# Patient Record
Sex: Female | Born: 1940 | Race: White | Hispanic: No | Marital: Single | Smoking: Former smoker
Health system: Southern US, Community
[De-identification: ages and names within clinical notes are randomized; demographics above are authoritative.]

## PROBLEM LIST (undated history)

## (undated) DIAGNOSIS — K5792 Diverticulitis of intestine, part unspecified, without perforation or abscess without bleeding: Secondary | ICD-10-CM

## (undated) DIAGNOSIS — F419 Anxiety disorder, unspecified: Secondary | ICD-10-CM

## (undated) DIAGNOSIS — E559 Vitamin D deficiency, unspecified: Secondary | ICD-10-CM

## (undated) DIAGNOSIS — K319 Disease of stomach and duodenum, unspecified: Secondary | ICD-10-CM

## (undated) DIAGNOSIS — I712 Thoracic aortic aneurysm, without rupture: Secondary | ICD-10-CM

## (undated) DIAGNOSIS — F329 Major depressive disorder, single episode, unspecified: Secondary | ICD-10-CM

## (undated) DIAGNOSIS — E876 Hypokalemia: Secondary | ICD-10-CM

## (undated) DIAGNOSIS — M810 Age-related osteoporosis without current pathological fracture: Secondary | ICD-10-CM

## (undated) DIAGNOSIS — F32A Depression, unspecified: Secondary | ICD-10-CM

## (undated) DIAGNOSIS — I10 Essential (primary) hypertension: Secondary | ICD-10-CM

## (undated) DIAGNOSIS — D62 Acute posthemorrhagic anemia: Secondary | ICD-10-CM

## (undated) DIAGNOSIS — K589 Irritable bowel syndrome without diarrhea: Secondary | ICD-10-CM

## (undated) DIAGNOSIS — G4700 Insomnia, unspecified: Secondary | ICD-10-CM

## (undated) DIAGNOSIS — E039 Hypothyroidism, unspecified: Secondary | ICD-10-CM

## (undated) DIAGNOSIS — E785 Hyperlipidemia, unspecified: Secondary | ICD-10-CM

## (undated) DIAGNOSIS — Q969 Turner's syndrome, unspecified: Secondary | ICD-10-CM

## (undated) DIAGNOSIS — H409 Unspecified glaucoma: Secondary | ICD-10-CM

## (undated) DIAGNOSIS — R011 Cardiac murmur, unspecified: Secondary | ICD-10-CM

## (undated) DIAGNOSIS — Z8719 Personal history of other diseases of the digestive system: Secondary | ICD-10-CM

## (undated) DIAGNOSIS — H539 Unspecified visual disturbance: Secondary | ICD-10-CM

## (undated) DIAGNOSIS — D72829 Elevated white blood cell count, unspecified: Secondary | ICD-10-CM

## (undated) DIAGNOSIS — K219 Gastro-esophageal reflux disease without esophagitis: Secondary | ICD-10-CM

## (undated) DIAGNOSIS — I7121 Aneurysm of the ascending aorta, without rupture: Secondary | ICD-10-CM

## (undated) DIAGNOSIS — R5381 Other malaise: Secondary | ICD-10-CM

## (undated) HISTORY — DX: Essential (primary) hypertension: I10

## (undated) HISTORY — DX: Hyperlipidemia, unspecified: E78.5

## (undated) HISTORY — DX: Turner's syndrome, unspecified: Q96.9

## (undated) HISTORY — DX: Unspecified visual disturbance: H53.9

## (undated) HISTORY — DX: Irritable bowel syndrome, unspecified: K58.9

## (undated) HISTORY — DX: Elevated white blood cell count, unspecified: D72.829

## (undated) HISTORY — DX: Major depressive disorder, single episode, unspecified: F32.9

## (undated) HISTORY — DX: Age-related osteoporosis without current pathological fracture: M81.0

## (undated) HISTORY — DX: Unspecified glaucoma: H40.9

## (undated) HISTORY — DX: Vitamin D deficiency, unspecified: E55.9

## (undated) HISTORY — DX: Gastro-esophageal reflux disease without esophagitis: K21.9

## (undated) HISTORY — PX: EYE SURGERY: SHX253

## (undated) HISTORY — DX: Acute posthemorrhagic anemia: D62

## (undated) HISTORY — DX: Other malaise: R53.81

## (undated) HISTORY — DX: Disease of stomach and duodenum, unspecified: K31.9

## (undated) HISTORY — DX: Depression, unspecified: F32.A

## (undated) HISTORY — PX: TONSILLECTOMY: SUR1361

## (undated) HISTORY — PX: GLAUCOMA VALVE INSERTION: SHX5297

## (undated) HISTORY — DX: Hypokalemia: E87.6

## (undated) HISTORY — DX: Insomnia, unspecified: G47.00

## (undated) HISTORY — PX: CATARACT EXTRACTION W/ INTRAOCULAR LENS  IMPLANT, BILATERAL: SHX1307

## (undated) HISTORY — DX: Thoracic aortic aneurysm, without rupture: I71.2

## (undated) HISTORY — DX: Aneurysm of the ascending aorta, without rupture: I71.21

## (undated) HISTORY — PX: APPENDECTOMY: SHX54

---

## 1993-01-09 HISTORY — PX: ABDOMINAL SURGERY: SHX537

## 1997-07-20 ENCOUNTER — Other Ambulatory Visit: Admission: RE | Admit: 1997-07-20 | Discharge: 1997-07-20 | Payer: Self-pay | Admitting: *Deleted

## 1997-08-01 ENCOUNTER — Other Ambulatory Visit: Admission: RE | Admit: 1997-08-01 | Discharge: 1997-08-01 | Payer: Self-pay | Admitting: Family Medicine

## 1997-08-02 ENCOUNTER — Other Ambulatory Visit: Admission: RE | Admit: 1997-08-02 | Discharge: 1997-08-02 | Payer: Self-pay | Admitting: Family Medicine

## 1997-08-05 ENCOUNTER — Other Ambulatory Visit: Admission: RE | Admit: 1997-08-05 | Discharge: 1997-08-05 | Payer: Self-pay | Admitting: Family Medicine

## 1997-09-02 ENCOUNTER — Other Ambulatory Visit: Admission: RE | Admit: 1997-09-02 | Discharge: 1997-09-02 | Payer: Self-pay | Admitting: *Deleted

## 1998-08-06 ENCOUNTER — Other Ambulatory Visit: Admission: RE | Admit: 1998-08-06 | Discharge: 1998-08-06 | Payer: Self-pay | Admitting: *Deleted

## 1998-08-06 ENCOUNTER — Encounter (INDEPENDENT_AMBULATORY_CARE_PROVIDER_SITE_OTHER): Payer: Self-pay

## 1999-08-23 ENCOUNTER — Ambulatory Visit (HOSPITAL_COMMUNITY): Admission: RE | Admit: 1999-08-23 | Discharge: 1999-08-23 | Payer: Self-pay | Admitting: Gastroenterology

## 2000-08-28 ENCOUNTER — Ambulatory Visit (HOSPITAL_COMMUNITY): Admission: RE | Admit: 2000-08-28 | Discharge: 2000-08-28 | Payer: Self-pay | Admitting: Gastroenterology

## 2005-03-27 ENCOUNTER — Other Ambulatory Visit: Admission: RE | Admit: 2005-03-27 | Discharge: 2005-03-27 | Payer: Self-pay | Admitting: Family Medicine

## 2006-08-12 ENCOUNTER — Emergency Department (HOSPITAL_COMMUNITY): Admission: EM | Admit: 2006-08-12 | Discharge: 2006-08-12 | Payer: Self-pay | Admitting: Emergency Medicine

## 2008-01-10 HISTORY — PX: OTHER SURGICAL HISTORY: SHX169

## 2008-04-07 ENCOUNTER — Encounter: Admission: RE | Admit: 2008-04-07 | Discharge: 2008-04-07 | Payer: Self-pay | Admitting: Otolaryngology

## 2009-06-21 ENCOUNTER — Other Ambulatory Visit: Admission: RE | Admit: 2009-06-21 | Discharge: 2009-06-21 | Payer: Self-pay | Admitting: Family Medicine

## 2009-10-11 ENCOUNTER — Emergency Department (HOSPITAL_COMMUNITY): Admission: EM | Admit: 2009-10-11 | Discharge: 2009-10-11 | Payer: Self-pay | Admitting: Emergency Medicine

## 2010-03-24 LAB — BASIC METABOLIC PANEL
BUN: 17 mg/dL (ref 6–23)
CO2: 25 mEq/L (ref 19–32)
Calcium: 9.2 mg/dL (ref 8.4–10.5)
Chloride: 94 mEq/L — ABNORMAL LOW (ref 96–112)
Creatinine, Ser: 0.65 mg/dL (ref 0.4–1.2)
GFR calc Af Amer: >60 mL/min (ref >60)
GFR calc non Af Amer: >60 mL/min (ref >60)
Glucose, Bld: 144 mg/dL — ABNORMAL HIGH (ref 70–99)
Potassium: 3.3 mEq/L — ABNORMAL LOW (ref 3.5–5.1)
Sodium: 131 mEq/L — ABNORMAL LOW (ref 135–145)

## 2010-03-24 LAB — URINALYSIS, ROUTINE W REFLEX MICROSCOPIC
Bilirubin Urine: NEGATIVE
Glucose, UA: NEGATIVE mg/dL
Hgb urine dipstick: NEGATIVE
Ketones, ur: 15 mg/dL — AB
Nitrite: NEGATIVE
Protein, ur: NEGATIVE mg/dL
Specific Gravity, Urine: 1.023 (ref 1.005–1.030)
Urobilinogen, UA: 0.2 mg/dL (ref 0.0–1.0)
pH: 6 (ref 5.0–8.0)

## 2010-03-24 LAB — DIFFERENTIAL
Basophils Absolute: 0 10*3/uL (ref 0.0–0.1)
Basophils Relative: 0 % (ref 0–1)
Eosinophils Absolute: 0 10*3/uL (ref 0.0–0.7)
Eosinophils Relative: 0 % (ref 0–5)
Lymphocytes Relative: 4 % — ABNORMAL LOW (ref 12–46)
Lymphs Abs: 0.7 10*3/uL (ref 0.7–4.0)
Monocytes Absolute: 0.5 10*3/uL (ref 0.1–1.0)
Monocytes Relative: 3 % (ref 3–12)
Neutro Abs: 14.4 10*3/uL — ABNORMAL HIGH (ref 1.7–7.7)
Neutrophils Relative %: 93 % — ABNORMAL HIGH (ref 43–77)

## 2010-03-24 LAB — HEPATIC FUNCTION PANEL
ALT: 18 U/L (ref 0–35)
AST: 21 U/L (ref 0–37)
Albumin: 4.4 g/dL (ref 3.5–5.2)
Alkaline Phosphatase: 79 U/L (ref 39–117)
Bilirubin, Direct: 0.1 mg/dL (ref 0.0–0.3)
Indirect Bilirubin: 0.6 mg/dL (ref 0.3–0.9)
Total Bilirubin: 0.7 mg/dL (ref 0.3–1.2)
Total Protein: 7.6 g/dL (ref 6.0–8.3)

## 2010-03-24 LAB — CBC
HCT: 48.7 % — ABNORMAL HIGH (ref 36.0–46.0)
Hemoglobin: 17 g/dL — ABNORMAL HIGH (ref 12.0–15.0)
MCH: 31.7 pg (ref 26.0–34.0)
MCHC: 35 g/dL (ref 30.0–36.0)
MCV: 90.6 fL (ref 78.0–100.0)
Platelets: 330 10*3/uL (ref 150–400)
RBC: 5.37 MIL/uL — ABNORMAL HIGH (ref 3.87–5.11)
RDW: 12.1 % (ref 11.5–15.5)
WBC: 15.5 10*3/uL — ABNORMAL HIGH (ref 4.0–10.5)

## 2010-05-27 NOTE — Procedures (Signed)
[facility]  Patient:    Kristi Erickson, Kristi Erickson                  MRN: [id number] Proc. Date: 08/23/99 Adm. Date:  [id number] Attending:  Orland Mustard CC:         Dellis Anes. Idell Pickles, M.D.   Procedure Report  PROCEDURE PERFORMED:  Colonoscopy.  ENDOSCOPIST:  Llana Aliment. Edwards, M.D.  MEDICATIONS:  Fentanyl 87.5 mcg and Versed 10 mg IV.  SCOPE:  Pediatric video colonoscope.  INDICATIONS:  Patient with abdominal pain.  She has been treated for diverticulitis.  CT scan done showing a markedly thickened sigmoid colon, could not rule out a tumor in this area.  Due to the abnormal CT, this procedure is done to rule out an occult tumor in this area.  DESCRIPTION OF PROCEDURE:  The procedure had been explained to the patient and consent obtained.  With the patient in the left lateral decubitus position, the pediatric video colonoscope was inserted and advanced under direct visualization.  The prep was quite good.  The patient had extensive diverticular disease in the left colon and it took sometime to get through. Once we were able to pass through this, we were able to advance rapidly to the cecum.  The right lower quadrant and ileocecal valve were seen and were normal.  The scope was withdrawn.  The ascending colon, hepatic flexure, transverse colon, splenic flexure, descending and sigmoid colon were seen well.  No other abnormalities were seen other than the diverticulosis in the sigmoid colon.  No polyps etc.  The scope was withdrawn.  The patient tolerated the procedure.  ASSESSMENT: 1. Abnormal computed tomography probably due to extensive    diverticular    disease.  No other abnormality seen. 2. 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  PLAN: 1. We will treat aggressively for diverticulosis with fiber    supplements and give a sheet about diverticular inflammation. 2. We will see her back in the office in 2-3 months. DD:  08/23/99 TD:   08/23/99 Job: [id number]

## 2010-05-27 NOTE — Procedures (Signed)
[facility]  Patient:    Kristi Erickson, Kristi Erickson Visit Number: [id number] MRN: [id number]          Service Type: END Location: ENDO Attending Physician:  Orland Mustard Proc. Date: 08/28/00 Adm. Date:  08/28/2000   CC:         Dellis Anes. Idell Pickles, M.D.   Procedure Report  PROCEDURE:  Esophagogastroduodenoscopy.  MEDICATIONS:  Hurricane spray, fentanyl 50 mcg, Versed 5 mg IV.  INDICATIONS FOR PROCEDURE:  Persistent epigastric pain of unclear etiology.  DESCRIPTION OF PROCEDURE:  The procedure had been explained to the patient and consent obtained. With the patient in the left lateral decubitus position, the Olympus video endoscope was inserted blindly into the esophagus and advanced under direct visualization. The stomach was entered, pylorus identified and passed. The duodenum including the bulb and second portion were seen well and were unremarkable. The scope was withdrawn back in the stomach. The pyloric channel was normal. The antrum and body were seen well and were normal. The fundus and cardia were seen on retroflexed view and were normal. There was a hiatal hernia with a small ulceration right at the gastroesophageal junction that appeared to be healing. It was approximately a 1/2 cm in diameter. The distal esophagus was otherwise unremarkable. The scope was withdrawn. The patient tolerated the procedure well.  ASSESSMENT:  Esophageal ulcer probably from reflux.  PLAN:  Will go ahead and give her a trial of Nexium since Aciphex is not helping her as well as she would like and see her back in the office in 6-8 weeks. Attending Physician:  Orland Mustard DD:  08/28/00 TD:  08/28/00 Job: [id number]

## 2010-10-24 LAB — COMPREHENSIVE METABOLIC PANEL
ALT: 27
AST: 25
Albumin: 4.1
Alkaline Phosphatase: 78
BUN: 17
CO2: 27
Calcium: 9.1
Chloride: 101
Creatinine, Ser: 0.67
GFR calc Af Amer: >60
GFR calc non Af Amer: >60
Glucose, Bld: 129 — ABNORMAL HIGH
Potassium: 3.7
Sodium: 134 — ABNORMAL LOW
Total Bilirubin: 0.7
Total Protein: 7.2

## 2010-10-24 LAB — CBC
HCT: 46.2 — ABNORMAL HIGH
Hemoglobin: 16.3 — ABNORMAL HIGH
MCHC: 35.2
MCV: 87.8
Platelets: 315
RBC: 5.26 — ABNORMAL HIGH
RDW: 12.1
WBC: 14.4 — ABNORMAL HIGH

## 2010-10-24 LAB — POCT CARDIAC MARKERS
CKMB, poc: 1
CKMB, poc: <1 — ABNORMAL LOW
CKMB, poc: <1 — ABNORMAL LOW
Myoglobin, poc: 26.2
Myoglobin, poc: 33.6
Myoglobin, poc: 40.5
Operator id: 1211
Operator id: 1211
Operator id: 1211
Troponin i, poc: <0.05
Troponin i, poc: <0.05
Troponin i, poc: <0.05

## 2010-10-24 LAB — DIFFERENTIAL
Basophils Absolute: 0
Basophils Relative: 0
Eosinophils Absolute: 0
Eosinophils Relative: 0
Lymphocytes Relative: 6 — ABNORMAL LOW
Lymphs Abs: 0.8
Monocytes Absolute: 0.9 — ABNORMAL HIGH
Monocytes Relative: 6
Neutro Abs: 12.6 — ABNORMAL HIGH
Neutrophils Relative %: 88 — ABNORMAL HIGH

## 2010-10-24 LAB — D-DIMER, QUANTITATIVE: D-Dimer, Quant: <0.22

## 2011-08-10 HISTORY — PX: TRANSTHORACIC ECHOCARDIOGRAM: SHX275

## 2014-01-15 ENCOUNTER — Other Ambulatory Visit: Payer: Self-pay | Admitting: Obstetrics and Gynecology

## 2014-01-15 DIAGNOSIS — K5730 Diverticulosis of large intestine without perforation or abscess without bleeding: Secondary | ICD-10-CM

## 2014-01-16 ENCOUNTER — Ambulatory Visit
Admission: RE | Admit: 2014-01-16 | Discharge: 2014-01-16 | Disposition: A | Discharge status: Home / Self Care | Payer: Medicare Other | Source: Ambulatory Visit | Attending: Obstetrics and Gynecology | Admitting: Obstetrics and Gynecology

## 2014-01-16 DIAGNOSIS — K5730 Diverticulosis of large intestine without perforation or abscess without bleeding: Secondary | ICD-10-CM

## 2014-01-16 MED ORDER — IOHEXOL 300 MG/ML  SOLN
100.0000 mL | Freq: Once | INTRAMUSCULAR | Status: AC | PRN
  Administered 2014-01-16: 100 mL via INTRAVENOUS

## 2015-03-18 ENCOUNTER — Telehealth: Payer: Self-pay | Admitting: Internal Medicine

## 2015-03-18 NOTE — Telephone Encounter (Signed)
Received records from [facility] for appointment on 03/31/15 with Dr Hilty.  Records given to N Hines (medical records) for Dr Hilty's schedule on 03/31/15. lp °

## 2015-03-31 ENCOUNTER — Encounter: Payer: Self-pay | Admitting: Internal Medicine

## 2015-03-31 ENCOUNTER — Ambulatory Visit (INDEPENDENT_AMBULATORY_CARE_PROVIDER_SITE_OTHER): Payer: Medicare Other | Admitting: Internal Medicine

## 2015-03-31 VITALS — BP 124/76 | HR 62 | Ht 58.5 in | Wt 115.0 lb

## 2015-03-31 DIAGNOSIS — I351 Nonrheumatic aortic (valve) insufficiency: Secondary | ICD-10-CM | POA: Diagnosis not present

## 2015-03-31 DIAGNOSIS — I10 Essential (primary) hypertension: Secondary | ICD-10-CM | POA: Insufficient documentation

## 2015-03-31 DIAGNOSIS — E785 Hyperlipidemia, unspecified: Secondary | ICD-10-CM | POA: Diagnosis not present

## 2015-03-31 NOTE — Progress Notes (Signed)
Patient ID: Kristi Erickson, female   DOB: Oct 04, 1940, 75 y.o.   MRN: [id number]    OFFICE NOTE  Chief Complaint:  Aortic Regurgitation  Primary Care Physician: Gaye AlkenBARNES,ELIZABETH STEWART, MD  HPI:  Kristi Comptonleanor Sue Marshburn is a 75 y.o. female with a history of Aortic Regurgitation, HTN, HLD, Hypothyroidism, Turner's Syndrome, Horseshoe Kidney, GERD, Glaucoma. She presents today for follow up. She was seen about four years ago by Dr. Rennis GoldenHilty for aortic regurgitation. Echocardiogram 08/18/2011 revealed Normal LV function and parameters. Trileaflet Aortic Valve with Moderate Regurgitation and mild calcification. She did not have any anginal symptoms or shortness of breath at that time. She has continued to be treated for her other chronic medical problems in the interval since her visit then. She maintains good control of her blood pressure with Quinipril and Diltiazem, she is being treated for Hyperlipidemia with Simvastatin. She is also treated for hypothyroidism.   She has not had any development of new chest pain at rest or with exertion, she has not had any light headedness with exertion or presyncope, she does not have any shortness of breath at rest. She will get short of breath after walking 2 blocks with recovery in 1-2 minutes. She is able to climb 2 flights of stairs without difficulty. She has not had any lower extremity edema. She takes her medications daily and continues to follow with her PCP Dr. Zachery DauerBarnes. Dr. Zachery DauerBarnes referred her back here for follow up and echocardiography monitoring.   PMHx:  Past Medical History  Diagnosis Date  . Vision disorder   . Stomach problems   . Vitamin D deficiency   . Turner's syndrome   . GERD (gastroesophageal reflux disease)   . Glaucoma   . Hypertension   . Hyperlipidemia   . Osteoporosis     No past surgical history on file.  FAMHx:  Family History  Problem Relation Age of Onset  . Heart attack Maternal Grandfather     SOCHx:   reports  that she has quit smoking. She has never used smokeless tobacco. She reports that she drinks alcohol. She reports that she does not use illicit drugs.  ALLERGIES:  Allergies  Allergen Reactions  . Shellfish-Derived Products Nausea And Vomiting    Clam, oyster    ROS: Pertinent items are noted in HPI.  HOME MEDS: Current Outpatient Prescriptions  Medication Sig Dispense Refill  . acetaminophen (TYLENOL) 500 MG tablet Take 500 mg by mouth every morning.    Marland Kitchen. alendronate (FOSAMAX) 70 MG tablet Take 1 tablet by mouth once a week.    . Ascorbic Acid (VITAMIN C) 1000 MG tablet Take 4,000 mg by mouth daily.    . Cholecalciferol (VITAMIN D) 2000 units CAPS Take 1 capsule by mouth daily.    Marland Kitchen. dicyclomine (BENTYL) 20 MG tablet Take 20 mg by mouth 3 (three) times daily as needed.  0  . diltiazem (DILACOR XR) 180 MG 24 hr capsule Take 1 capsule by mouth daily.    . dorzolamide (TRUSOPT) 2 % ophthalmic solution Place 1 drop into both eyes 2 (two) times daily.    Marland Kitchen. levothyroxine (SYNTHROID, LEVOTHROID) 88 MCG tablet Take 1 tablet by mouth every morning.    . mirtazapine (REMERON) 45 MG tablet Take 1 tablet by mouth daily.    . Multiple Vitamin (MULTIVITAMIN) tablet Take 1 tablet by mouth daily.    . quinapril (ACCUPRIL) 40 MG tablet Take 1 tablet by mouth daily.    . simvastatin (ZOCOR) 40 MG tablet  Take 1 tablet by mouth daily.    . temazepam (RESTORIL) 15 MG capsule Take 1 capsule by mouth at bedtime.     No current facility-administered medications for this visit.    LABS/IMAGING: No results found for this or any previous visit (from the past 48 hour(s)). No results found.  WEIGHTS: Wt Readings from Last 3 Encounters:  03/31/15 115 lb (52.164 kg)    VITALS: BP 124/76 mmHg  Pulse 62  Ht 4' 10.5" (1.486 m)  Wt 115 lb (52.164 kg)  BMI 23.62 kg/m2  EXAM: General appearance: alert, cooperative and no distress Neck: no adenopathy, no carotid bruit, no JVD, supple, symmetrical,  trachea midline and thyroid not enlarged, symmetric, no tenderness/mass/nodules Lungs: clear to auscultation bilaterally Heart: regular rate and rhythm, S1, S2 normal and diastolic murmur: early diastolic 3/6, decrescendo at 2nd left intercostal space Abdomen: soft, non-tender; bowel sounds normal; no masses,  no organomegaly Extremities: extremities normal, atraumatic, no cyanosis or edema and no edema, redness or tenderness in the calves or thighs Pulses: 2+ and symmetric Skin: Skin color, texture, turgor normal. No rashes or lesions  EKG: 3/22: Normal Sinus Rhythm, Left anterior fascicular block, otherwise normal. Compared to previous.   ASSESSMENT: 1. Aortic Regurgitation - moderate and without symptoms. 2. HTN 3. HLD 4. Hypothyroidism   PLAN: 1.   Repeat Echocardiogram for monitoring.  2.   Continue current HTN regimen - on Diltiazem and Qunipril.  3.   HLD - continue zocor, monitoring per pcp 4    F/u in 1 year unless echo abnormal.   Justa Hatchell 03/31/2015, 11:50 AM  Pt. Seen and examined. Agree with the NP/PA-C note as written. [name] presents for follow-up of her aortic insufficiency. She seems to be asymptomatic with it. She is overdue for an echocardiogram. Her exam is unremarkable except for diastolic murmur. Blood pressure seems to be well-controlled. Her primary care provider is managing her dyslipidemia. We will call her with the results of her echo otherwise plan continued medical therapy.  Chrystie Nose, MD, [facility] Attending Cardiologist [facility]

## 2015-03-31 NOTE — Patient Instructions (Signed)
Your physician has requested that you have an echocardiogram @ [address] Echocardiography is a painless test that uses sound waves to create images of your heart. It provides your doctor with information about the size and shape of your heart and how well your heart's chambers and valves are working. This procedure takes approximately one hour. There are no restrictions for this procedure.   Your physician recommends that you schedule a follow-up appointment as needed - we will call you with the results.

## 2015-04-15 ENCOUNTER — Other Ambulatory Visit: Payer: Self-pay

## 2015-04-15 ENCOUNTER — Ambulatory Visit (HOSPITAL_COMMUNITY): Payer: Medicare Other | Attending: Cardiology

## 2015-04-15 DIAGNOSIS — I10 Essential (primary) hypertension: Secondary | ICD-10-CM | POA: Insufficient documentation

## 2015-04-15 DIAGNOSIS — E785 Hyperlipidemia, unspecified: Secondary | ICD-10-CM | POA: Insufficient documentation

## 2015-04-15 DIAGNOSIS — I351 Nonrheumatic aortic (valve) insufficiency: Secondary | ICD-10-CM | POA: Diagnosis not present

## 2015-04-15 DIAGNOSIS — I359 Nonrheumatic aortic valve disorder, unspecified: Secondary | ICD-10-CM | POA: Diagnosis present

## 2015-04-15 DIAGNOSIS — I77810 Thoracic aortic ectasia: Secondary | ICD-10-CM | POA: Insufficient documentation

## 2015-04-19 ENCOUNTER — Encounter: Payer: Self-pay | Admitting: *Deleted

## 2015-04-19 ENCOUNTER — Other Ambulatory Visit: Payer: Self-pay | Admitting: *Deleted

## 2015-04-19 DIAGNOSIS — I77810 Thoracic aortic ectasia: Secondary | ICD-10-CM

## 2015-04-19 DIAGNOSIS — Z79899 Other long term (current) drug therapy: Secondary | ICD-10-CM

## 2015-04-20 ENCOUNTER — Telehealth: Payer: Self-pay | Admitting: *Deleted

## 2015-04-20 ENCOUNTER — Other Ambulatory Visit: Payer: Self-pay | Admitting: *Deleted

## 2015-04-20 DIAGNOSIS — I77810 Thoracic aortic ectasia: Secondary | ICD-10-CM

## 2015-04-20 NOTE — Telephone Encounter (Signed)
Spoke with patient regarding appointment for CTA chest for dilated ascending aorta--scheduled for 04/29/15 @ 3:30 pm--arrive at 3:15 pm for check in.  Nothing to eat or drink after 12 noon on 04/29/15.  Please have lab work done this week and f/u appointment with Dr. Rennis GoldenHilty 05/10/15 @ 2:15 pm.  Patient voiced her understanding.

## 2015-04-22 LAB — CREATININE, SERUM: CREATININE: 0.64 mg/dL (ref 0.60–0.93)

## 2015-04-22 LAB — BUN: BUN: 21 mg/dL (ref 7–25)

## 2015-04-29 ENCOUNTER — Ambulatory Visit (HOSPITAL_COMMUNITY): Payer: Medicare Other

## 2015-04-29 ENCOUNTER — Ambulatory Visit (INDEPENDENT_AMBULATORY_CARE_PROVIDER_SITE_OTHER)
Admission: RE | Admit: 2015-04-29 | Discharge: 2015-04-29 | Disposition: A | Discharge status: Home / Self Care | Payer: Medicare Other | Source: Ambulatory Visit | Attending: Internal Medicine | Admitting: Internal Medicine

## 2015-04-29 DIAGNOSIS — I77810 Thoracic aortic ectasia: Secondary | ICD-10-CM | POA: Diagnosis not present

## 2015-04-29 MED ORDER — IOHEXOL 350 MG/ML SOLN
100.0000 mL | Freq: Once | INTRAVENOUS | Status: AC | PRN
  Administered 2015-04-29: 100 mL via INTRAVENOUS

## 2015-04-30 ENCOUNTER — Other Ambulatory Visit: Payer: Self-pay | Admitting: *Deleted

## 2015-04-30 DIAGNOSIS — I77810 Thoracic aortic ectasia: Secondary | ICD-10-CM

## 2015-05-10 ENCOUNTER — Encounter: Payer: Self-pay | Admitting: Internal Medicine

## 2015-05-10 ENCOUNTER — Ambulatory Visit (INDEPENDENT_AMBULATORY_CARE_PROVIDER_SITE_OTHER): Payer: Medicare Other | Admitting: Internal Medicine

## 2015-05-10 VITALS — BP 128/70 | HR 69 | Ht <= 58 in | Wt 114.0 lb

## 2015-05-10 DIAGNOSIS — I10 Essential (primary) hypertension: Secondary | ICD-10-CM | POA: Diagnosis not present

## 2015-05-10 DIAGNOSIS — I351 Nonrheumatic aortic (valve) insufficiency: Secondary | ICD-10-CM

## 2015-05-10 DIAGNOSIS — E785 Hyperlipidemia, unspecified: Secondary | ICD-10-CM

## 2015-05-10 NOTE — Patient Instructions (Signed)
Your physician wants you to follow-up in: 6 months with Dr. Rennis GoldenHilty. You will receive a reminder letter in the mail two months in advance. If you don't receive a letter, please call our office to schedule the follow-up appointment. -- you will receive a notification via MyChart when it is time for your next appointment.

## 2015-05-11 ENCOUNTER — Encounter: Payer: Medicare Other | Admitting: Thoracic Surgery (Cardiothoracic Vascular Surgery)

## 2015-05-12 ENCOUNTER — Encounter: Payer: Medicare Other | Admitting: Thoracic Surgery (Cardiothoracic Vascular Surgery)

## 2015-05-12 ENCOUNTER — Encounter: Payer: Self-pay | Admitting: Thoracic Surgery (Cardiothoracic Vascular Surgery)

## 2015-05-12 NOTE — Progress Notes (Signed)
This encounter was created in error - please disregard.  This encounter was created in error - please disregard.

## 2015-05-14 NOTE — Progress Notes (Signed)
Patient ID: Kristi Erickson, female   DOB: 28-Oct-1940, 75 y.o.   MRN: [id number]    OFFICE NOTE  Chief Complaint:  Aortic Regurgitation  Primary Care Physician: Gaye AlkenBARNES,ELIZABETH STEWART, MD  HPI:  Kristi Erickson is a 75 y.o. female with a history of Aortic Regurgitation, HTN, HLD, Hypothyroidism, Turner's Syndrome, Horseshoe Kidney, GERD, Glaucoma. She presents today for follow up. She was seen about four years ago by Dr. Rennis GoldenHilty for aortic regurgitation. Echocardiogram 08/18/2011 revealed Normal LV function and parameters. Trileaflet Aortic Valve with Moderate Regurgitation and mild calcification. She did not have any anginal symptoms or shortness of breath at that time. She has continued to be treated for her other chronic medical problems in the interval since her visit then. She maintains good control of her blood pressure with Quinipril and Diltiazem, she is being treated for Hyperlipidemia with Simvastatin. She is also treated for hypothyroidism.   She has not had any development of new chest pain at rest or with exertion, she has not had any light headedness with exertion or presyncope, she does not have any shortness of breath at rest. She will get short of breath after walking 2 blocks with recovery in 1-2 minutes. She is able to climb 2 flights of stairs without difficulty. She has not had any lower extremity edema. She takes her medications daily and continues to follow with her PCP Dr. Zachery DauerBarnes. Dr. Zachery DauerBarnes referred her back here for follow up and echocardiography monitoring.   05/10/2015  Mrs. Kristi Erickson returns today for follow-up. She was sent for an echocardiogram which demonstrated an LVEF of 60-65% with mild to moderate AI and a dilated aortic root measuring between 4.8 and 5.1 cm. Based on these findings I recommended a CT aortogram which she had on 04/29/2015. This demonstrated aneurysm of the ascending aorta, measuring up to 5.3 cm. I reviewed the results and had already referred  her to thoracic surgery prior to this appointment. We again reviewed those results in the office today.  PMHx:  Past Medical History  Diagnosis Date  . Vision disorder   . Stomach problems   . Vitamin D deficiency   . Turner's syndrome   . GERD (gastroesophageal reflux disease)   . Glaucoma   . Hypertension   . Hyperlipidemia   . Osteoporosis     Past Surgical History  Procedure Laterality Date  . Other surgical history  2010     ear surgery  . Abdominal surgery  1995  . Transthoracic echocardiogram  08/2011    EF=/> 55%, LA mildly dilated, midl mitral annular calcification, trace MR, trace TR, mild AV calcification w/mod regurg, trace pulm valve regurg    FAMHx:  Family History  Problem Relation Age of Onset  . Heart attack Maternal Grandfather   . Heart attack Father   . Skin cancer Brother     SOCHx:   reports that she has quit smoking. She has never used smokeless tobacco. She reports that she drinks alcohol. She reports that she does not use illicit drugs.  ALLERGIES:  Allergies  Allergen Reactions  . Shellfish-Derived Products Nausea And Vomiting    Clam, oyster    ROS: Pertinent items are noted in HPI.  HOME MEDS: Current Outpatient Prescriptions  Medication Sig Dispense Refill  . acetaminophen (TYLENOL) 500 MG tablet Take 500 mg by mouth every morning.    Marland Kitchen. alendronate (FOSAMAX) 70 MG tablet Take 1 tablet by mouth once a week.    . Ascorbic Acid (VITAMIN C)  1000 MG tablet Take 4,000 mg by mouth daily.    . Cholecalciferol (VITAMIN D) 2000 units CAPS Take 1 capsule by mouth daily.    Marland Kitchen dicyclomine (BENTYL) 20 MG tablet Take 20 mg by mouth 3 (three) times daily as needed.  0  . diltiazem (DILACOR XR) 180 MG 24 hr capsule Take 1 capsule by mouth daily.    . dorzolamide (TRUSOPT) 2 % ophthalmic solution Place 1 drop into both eyes 2 (two) times daily.    Marland Kitchen levothyroxine (SYNTHROID, LEVOTHROID) 88 MCG tablet Take 1 tablet by mouth every morning.    .  mirtazapine (REMERON) 45 MG tablet Take 1 tablet by mouth daily.    . Multiple Vitamin (MULTIVITAMIN) tablet Take 1 tablet by mouth daily.    . quinapril (ACCUPRIL) 40 MG tablet Take 1 tablet by mouth daily.    . simvastatin (ZOCOR) 40 MG tablet Take 1 tablet by mouth daily.    . temazepam (RESTORIL) 15 MG capsule Take 1 capsule by mouth at bedtime.     No current facility-administered medications for this visit.    LABS/IMAGING: No results found for this or any previous visit (from the past 48 hour(s)). No results found.  WEIGHTS: Wt Readings from Last 3 Encounters:  05/12/15 114 lb (51.71 kg)  05/10/15 114 lb (51.71 kg)  03/31/15 115 lb (52.164 kg)    VITALS: BP 128/70 mmHg  Pulse 69  Ht  (1.473 m)  Wt 114 lb (51.71 kg)  BMI 23.83 kg/m2  EXAM: Deferred  EKG: Deferred  ASSESSMENT: 1. Aortic Regurgitation - mild to moderate 2. 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 HTN 4. HLD 5. Hypothyroidism   PLAN: 1. There is a 5.3 cm ascending aortic aneurysm noted by CT angiography. She has mild to moderate aortic insufficiency associated with this. I will refer her to Dr. Dorris Fetch for evaluation and either close follow-up or possible surgical repair in the near future.  Chrystie Nose, MD, [facility] Attending Cardiologist [facility]  Lisette Abu Kobi Aller 05/14/2015, 1:28 PM

## 2015-05-19 ENCOUNTER — Institutional Professional Consult (permissible substitution) (INDEPENDENT_AMBULATORY_CARE_PROVIDER_SITE_OTHER): Payer: Medicare Other | Admitting: Thoracic Surgery (Cardiothoracic Vascular Surgery)

## 2015-05-19 ENCOUNTER — Encounter: Payer: Self-pay | Admitting: Thoracic Surgery (Cardiothoracic Vascular Surgery)

## 2015-05-19 ENCOUNTER — Encounter: Payer: Medicare Other | Admitting: Cardiothoracic Surgery

## 2015-05-19 VITALS — BP 123/75 | HR 72 | Resp 20 | Ht <= 58 in | Wt 114.0 lb

## 2015-05-19 DIAGNOSIS — E041 Nontoxic single thyroid nodule: Secondary | ICD-10-CM | POA: Diagnosis not present

## 2015-05-19 DIAGNOSIS — Q631 Lobulated, fused and horseshoe kidney: Secondary | ICD-10-CM

## 2015-05-19 DIAGNOSIS — I712 Thoracic aortic aneurysm, without rupture: Secondary | ICD-10-CM | POA: Insufficient documentation

## 2015-05-19 DIAGNOSIS — I7121 Aneurysm of the ascending aorta, without rupture: Secondary | ICD-10-CM | POA: Insufficient documentation

## 2015-05-19 DIAGNOSIS — I7122 Aneurysm of the aortic arch, without rupture: Secondary | ICD-10-CM | POA: Insufficient documentation

## 2015-05-19 DIAGNOSIS — Q969 Turner's syndrome, unspecified: Secondary | ICD-10-CM | POA: Insufficient documentation

## 2015-05-19 NOTE — Progress Notes (Signed)
PCP is Gaye Alken, MD Referring Provider is Rennis Golden Lisette Abu, MD  Chief Complaint  Patient presents with  . 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 She had an echocardiogram in 2013 which showed moderate aortic insufficiency and preserved left ventricular function. She recently had a repeat echocardiogram. It showed mild-to-moderate aortic insufficiency, normal left ventricular function, and mild-to-moderate aortic insufficiency. The ascending aorta was noted to be dilated. 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 There was marked dilatation of the origin of the innominate artery as well. There was no evidence of dissection.  She is 4 feet 10 inches tall and weighs 114 pounds. She complains of shortness of breath with exertion after walking about a block or a flight of stairs. She says that she could get up 2 flights of stairs if she had to. She does recover within a minute or so of resting. She denies any chest pain, pressure, or tightness associated with shortness of breath. She has not had any presyncopal or syncopal spells. She is not aware of any family history of aneurysms.   Past Medical History  Diagnosis Date  . Vision disorder   . Stomach problems   . Vitamin D deficiency   . Turner's syndrome   . GERD (gastroesophageal reflux disease)   . Glaucoma   . Hypertension   . Hyperlipidemia   . Osteoporosis     Past Surgical History  Procedure Laterality Date  . Other surgical history  2010     ear surgery  . Abdominal surgery   1995  . Transthoracic echocardiogram  08/2011    EF=/> 55%, LA mildly dilated, midl mitral annular calcification, trace MR, trace TR, mild AV calcification w/mod regurg, trace pulm valve regurg    Family History  Problem Relation Age of Onset  . Heart attack Maternal Grandfather   . Heart attack Father   . Skin cancer Brother     Social History Social History  Substance Use Topics  . Smoking status: Former Smoker -- 3 years  . Smokeless tobacco: Never Used  . Alcohol Use: 0.0 oz/week    0 Standard drinks or equivalent per week     Comment: 1-2 glasses of wine/week    Current Outpatient Prescriptions  Medication Sig Dispense Refill  . acetaminophen (TYLENOL) 500 MG tablet Take 500 mg by mouth every morning.    Marland Kitchen alendronate (FOSAMAX) 70 MG tablet Take 1 tablet by mouth once a week.    . Ascorbic Acid (VITAMIN C) 1000 MG tablet Take 4,000 mg by mouth daily.    . Cholecalciferol (VITAMIN D) 2000 units CAPS Take 1 capsule by mouth daily.    Marland Kitchen dicyclomine (BENTYL) 20 MG tablet Take 20 mg by mouth 3 (three) times daily as needed.  0  . diltiazem (DILACOR XR) 180 MG 24 hr capsule Take 1 capsule by mouth daily.    . dorzolamide (TRUSOPT) 2 % ophthalmic solution Place 1 drop into both eyes 2 (two) times daily.    Marland Kitchen levothyroxine (SYNTHROID, LEVOTHROID) 88 MCG tablet Take 1 tablet by mouth every morning.    . mirtazapine (REMERON) 45  MG tablet Take 1 tablet by mouth daily.    . Multiple Vitamin (MULTIVITAMIN) tablet Take 1 tablet by mouth daily.    . quinapril (ACCUPRIL) 40 MG tablet Take 1 tablet by mouth daily.    . simvastatin (ZOCOR) 40 MG tablet Take 1 tablet by mouth daily.    . temazepam (RESTORIL) 15 MG capsule Take 1 capsule by mouth at bedtime.     No current facility-administered medications for this visit.    Allergies  Allergen Reactions  . Shellfish-Derived Products Nausea And Vomiting    Clam, oyster    Review of Systems  Constitutional: Negative for fever,  chills, activity change, appetite change and unexpected weight change.  HENT: Positive for hearing loss. Negative for trouble swallowing and voice change.   Eyes: Positive for visual disturbance ( ).  Respiratory: Positive for shortness of breath (With exertion). Negative for cough, wheezing and stridor.   Cardiovascular: Negative for chest pain and leg swelling.       Heart murmur  Gastrointestinal: Positive for abdominal pain (Left lower quadrant, history of diverticulitis) and constipation.  Genitourinary: Negative for difficulty urinating and dyspareunia.       Horseshoe kidney  Musculoskeletal: Negative for joint swelling and arthralgias.  Neurological: Negative for dizziness, syncope, speech difficulty and headaches.  Hematological: Negative for adenopathy. Does not bruise/bleed easily.  All other systems reviewed and are negative.   BP 123/75 mmHg  Pulse 72  Resp 20  Ht 4\' 10"  (1.473 m)  Wt 114 lb (51.71 kg)  BMI 23.83 kg/m2  SpO2 98% Physical Exam  Constitutional: She is oriented to person, place, and time. She appears well-nourished. No distress.  75 year old woman with small stature and webbed neck  HENT:  Head: Normocephalic and atraumatic.  Eyes: Conjunctivae and EOM are normal. No scleral icterus.  Neck: Neck supple. No thyromegaly present.  No carotid bruit  Cardiovascular: Normal rate, regular rhythm and intact distal pulses.   Murmur (1 to 2/6 diastolic) heard. Pulmonary/Chest: Effort normal and breath sounds normal. No respiratory distress. She has no wheezes. She has no rales.  Abdominal: Soft. There is no tenderness.  Musculoskeletal: Normal range of motion. She exhibits no edema.  Kyphoscoliosis  Lymphadenopathy:    She has no cervical adenopathy.  Neurological: She is alert and oriented to person, place, and time. No cranial nerve deficit.  No focal motor weakness  Skin: Skin is warm and dry.  Vitals reviewed.    Diagnostic  Tests: ECHOCARDIOGRAM Study Conclusions  - Left ventricle: The cavity size was normal. Systolic function was  normal. The estimated ejection fraction was in the range of 60%  to 65%. Wall motion was normal; there were no regional wall  motion abnormalities. There was an increased relative  contribution of atrial contraction to ventricular filling.  Doppler parameters are consistent with abnormal left ventricular  relaxation (grade 1 diastolic dysfunction). - Aortic valve: Trileaflet; normal thickness, mildly calcified  leaflets. There was mild to moderate regurgitation directed  eccentrically in the LVOT and towards the mitral anterior  leaflet. - Aorta: Ascending aortic diameter: 51 mm (S). - Ascending aorta: The ascending aorta was severely dilated.  Impressions:  - Normal LVF with grade 1 diastolic dysfunction. AV leaflets are  mildly calcified. No AS. There is mild to moderate AI  eccentrically directed towards the anterior mitral valve leaflet.  The ascending aorta is dilated with measurements ranging from 48  to 51mm in diameter. Recommend Chest CT to get more accurate  assessment.  CT ANGIOGRAPHY CHEST WITH CONTRAST  TECHNIQUE: Multidetector CT imaging of the chest was performed using the standard protocol during bolus administration of intravenous contrast. Multiplanar CT image reconstructions and MIPs were obtained to evaluate the vascular anatomy.  CONTRAST: OMNIPAQUE IOHEXOL 350 MG/ML SOLN  COMPARISON: Prior CT abdomen/ pelvis 01/16/2014  FINDINGS: Mediastinum: 1.8 cm heterogeneous enhancing nodule within the right thyroid gland. The remainder the thoracic inlet is unremarkable. No mediastinal mass or suspicious adenopathy. The thoracic esophagus is within normal limits.  Heart/Vascular: Significant aneurysmal dilatation of the ascending and transverse thoracic aorta. The ascending thoracic aorta measures up to 5.3 cm in diameter.  The transverse aorta measures up to 5.1 cm in diameter. 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 Mild atherosclerotic plaque without evidence of significant stenosis. No effacement of the sino-tubular junction. The aortic root is within normal limits. The heart is normal in size. Calcifications present along the course of the coronary arteries. The main pulmonary artery is normal in size. No evidence of central pulmonary embolus.  Lungs/Pleura: Trace dependent atelectasis in the lower lobes. No suspicious pulmonary nodule or mass. No edema, pleural effusion, pneumothorax or focal consolidation.  Bones/Soft Tissues: No acute fracture or aggressive appearing lytic or blastic osseous lesion.  Upper Abdomen: Visualized upper abdominal organs are unremarkable.  Review of the MIP images confirms the above findings.  IMPRESSION: VASCULAR  1. 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 Ascending thoracic aortic aneurysm. Recommend semi-annual imaging followup by CTA or MRA and referral to cardiothoracic surgery if not already obtained. This recommendation follows 2010 ACCF/AHA/AATS/ACR/ASA/SCA/SCAI/SIR/STS/SVM Guidelines for the Diagnosis and Management of Patients With Thoracic Aortic Disease. Circulation. 2010; 121: R604-V409 2. 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 3. Fusiform aneurysmal dilatation of the aortic arch measuring up to 5.1 cm in diameter. 4. Atherosclerotic vascular calcifications including coronary artery calcifications. NON VASCULAR  1. Incidental note is made of a 1.8 cm right-sided thyroid nodule. Further evaluation with dedicated  thyroid ultrasound could be performed if clinically warranted. 2. Otherwise, unremarkable CT scan of the chest.  Signed,  Sterling Big, MD  [facility]  [facility]   Electronically Signed  By: Malachy Moan M.D.  On: 04/29/2015 16:03 I personally reviewed the echocardiogram images and CT images. I concur with findings as noted above.  Impression: 75 year old woman with Turner syndrome who has a 5.3 cm ascending and arch aneurysm. While below the absolute cut off and the 5.5-6 cm range, this aneurysm is extremely large relative to the patient's size, and I think needs repair. She is at high risk for dissection and/or rupture.  I had a long discussion with Kristi Erickson and her brother who accompanied her. We reviewed the CT scan images and discussed the echocardiogram findings. We discussed the natural history of aortic aneurysms as well as their treatment. I described the proposed operation to them in detail. She would require a full sternotomy, extracorporeal circulation, and deep hypothermic circulatory arrest to repair this aneurysm. We discussed the length of operation, the expected hospital stay, and the overall recovery. We are frank discussion regarding the risks of the procedure. They understand the risk include, but are not limited to death, MI, stroke, hypoxic  brain injury, DVT, PE, bleeding, blood product transfusion, infection, as well as the possibility of renal or respiratory failure or gastrointestinal complications.  She would need cardiac catheterization prior to repair of the aneurysm.  She had many questions and I tried to answer those the best my ability. She would like a second opinion from Dr. Tyrone SageGerhardt before making a decision as to whether to proceed. I will help her arrange that.  Plan: Second opinion with Dr. Tyrone SageGerhardt.  If she decides to proceed with surgery, I would be happy to do it if she so  chooses.  Kristi SlotSteven C Alania Overholt, MD [facility] [phone]

## 2015-05-31 ENCOUNTER — Encounter: Payer: Self-pay | Admitting: Cardiothoracic Surgery

## 2015-05-31 ENCOUNTER — Institutional Professional Consult (permissible substitution) (INDEPENDENT_AMBULATORY_CARE_PROVIDER_SITE_OTHER): Payer: Medicare Other | Admitting: Cardiothoracic Surgery

## 2015-05-31 VITALS — BP 134/84 | HR 64 | Resp 20 | Ht <= 58 in | Wt 114.0 lb

## 2015-05-31 DIAGNOSIS — I712 Thoracic aortic aneurysm, without rupture: Secondary | ICD-10-CM | POA: Diagnosis not present

## 2015-05-31 DIAGNOSIS — Q969 Turner's syndrome, unspecified: Secondary | ICD-10-CM | POA: Diagnosis not present

## 2015-05-31 DIAGNOSIS — I7121 Aneurysm of the ascending aorta, without rupture: Secondary | ICD-10-CM

## 2015-05-31 DIAGNOSIS — I7122 Aneurysm of the aortic arch, without rupture: Secondary | ICD-10-CM

## 2015-05-31 NOTE — Progress Notes (Signed)
    [address]       [place] [address]             [phone]                    [place] Sue [facility]rosa [facility] Medical Record #[id number] Date of Birth: 03/18/1940  Referring: Hilty, Kenneth C, MD Primary Care: BARNES,ELIZABETH STEWART, MD  Chief Complaint:    Chief Complaint  Patient presents with  . Thoracic Aortic Aneurysm    surgical eval, 2nd opinion    History of Present Illness:    Kristi Erickson 75 y.o. female is seen in the office  today for second opinion   concerning replacement  ascending aorta and arch.  Patient has  history of Aortic Regurgitation, HTN, HLD, Hypothyroidism, Turner's Syndrome, Horseshoe Kidney, GERD, Glaucoma.  . She was seen about four years ago by Dr. Hilty for aortic regurgitation. Echocardiogram 08/18/2011 revealed Normal LV function and parameters. Trileaflet Aortic Valve with Moderate Regurgitation and mild calcification. Abdominal CT 2011 showed dilated ascending aorta partially imaged at 4.9 cm.  She does  not have any anginal symptoms or  any shortness of breath at rest,  She does have  shortness of breath walking briskly or climbing stairs . S  She was sent for an echocardiogram which demonstrated an LVEF of 60-65% with mild to moderate AI and a dilated aortic root measuring between 4.8 and 5.1 cm. Based on these findings a formal  CT aortogram of full chest was done on 04/29/2015. This demonstrated aneurysm of the ascending aorta, measuring up to 5.3 cm.   Current Activity/ Functional Status:  Patient is independent with mobility/ambulation, transfers, ADL's, IADL's.   Zubrod Score: At the time of surgery this patient's most appropriate activity status/level should be described as: []    0    Normal activity, no symptoms []    1    Restricted in physical strenuous activity but ambulatory, able to do out light work [x]    2    Ambulatory and capable of self care, unable to do work activities, up and about                >50 % of waking hours                              []    3    Only limited self care, in bed greater than 50% of waking hours []    4    Completely disabled, no self care, confined to bed or chair []    5    Moribund   Past Medical History  Diagnosis Date  . Vision disorder   . Stomach problems   . Vitamin D deficiency   . Turner's syndrome   . GERD (gastroesophageal reflux disease)   . Glaucoma   . Hypertension   . Hyperlipidemia   . Osteoporosis     Past Surgical History  Procedure Laterality Date  . Other surgical history  2010     ear surgery  . Abdominal surgery  1995  . Transthoracic echocardiogram  08/2011    EF=/> 55%, LA mildly dilated, midl mitral annular calcification, trace MR, trace TR, mild AV calcification w/mod regurg, trace pulm valve regurg    Family History  Problem Relation Age of Onset  . Heart attack Maternal Grandfather   . Heart attack   Father   . Skin cancer Brother     Social History   Social History  . Marital Status: Single    Spouse Name: N/A  . Number of Children: N/A  . Years of Education: M.A.   Occupational History  . Not on file.   Social History Main Topics  . Smoking status: Former Smoker -- 3 years  . Smokeless tobacco: Never Used  . Alcohol Use: 0.0 oz/week    0 Standard drinks or equivalent per week     Comment: 1-2 glasses of wine/week  . Drug Use: No  . Sexual Activity: Not on file   Other Topics Concern  . Not on file   Social History Narrative    History  Smoking status  . Former Smoker -- 3 years  Smokeless tobacco  . Never Used    History  Alcohol Use  . 0.0 oz/week  . 0 Standard drinks or equivalent per week    Comment: 1-2 glasses of wine/week     Allergies  Allergen Reactions  . Shellfish-Derived Products Nausea And Vomiting    Clam, oyster    Current Outpatient Prescriptions  Medication Sig Dispense Refill  . acetaminophen (TYLENOL) 500 MG tablet Take 500 mg by mouth every morning.      . alendronate (FOSAMAX) 70 MG tablet Take 1 tablet by mouth once a week.    . Ascorbic Acid (VITAMIN C) 1000 MG tablet Take 4,000 mg by mouth daily.    . Cholecalciferol (VITAMIN D) 2000 units CAPS Take 1 capsule by mouth daily.    . dicyclomine (BENTYL) 20 MG tablet Take 20 mg by mouth 3 (three) times daily as needed.  0  . diltiazem (DILACOR XR) 180 MG 24 hr capsule Take 1 capsule by mouth daily.    . dorzolamide (TRUSOPT) 2 % ophthalmic solution Place 1 drop into both eyes 2 (two) times daily.    . levothyroxine (SYNTHROID, LEVOTHROID) 88 MCG tablet Take 1 tablet by mouth every morning.    . mirtazapine (REMERON) 45 MG tablet Take 1 tablet by mouth daily.    . Multiple Vitamin (MULTIVITAMIN) tablet Take 1 tablet by mouth daily.    . quinapril (ACCUPRIL) 40 MG tablet Take 1 tablet by mouth daily.    . simvastatin (ZOCOR) 40 MG tablet Take 1 tablet by mouth daily.    . temazepam (RESTORIL) 15 MG capsule Take 1 capsule by mouth at bedtime.     No current facility-administered medications for this visit.      Review of Systems:     Cardiac Review of Systems: Y or N  Chest Pain [ n   ]  Resting SOB [  n ] Exertional SOB  [ y ]  Orthopnea [n  ]   Pedal Edema [n   ]    Palpitations [n  ] Syncope  [ n ]   Presyncope [ n  ]  General Review of Systems: [Y] = yes [  ]=no Constitional: recent weight change [ n ];  Wt loss over the last 3 months [   ] anorexia [  ]; fatigue [  ]; nausea [  ]; night sweats [  ]; fever [  ]; or chills [  ];          Dental: poor dentition[  ]; Last Dentist visit:   Eye : blurred vision [  ]; diplopia [   ]; vision changes [  ];  Amaurosis fugax[  ]; Resp: cough [  ];    wheezing[  ];  hemoptysis[  ]; shortness of breath[  ]; paroxysmal nocturnal dyspnea[  ]; dyspnea on exertion[  ]; or orthopnea[  ];  GI:  gallstones[  ], vomiting[  ];  dysphagia[  ]; melena[  ];  hematochezia [  ]; heartburn[  ];   Hx of  Colonoscopy[  ]; GU: kidney stones [  ]; hematuria[  ];    dysuria [  ];  nocturia[  ];  history of     obstruction [  ]; urinary frequency [  ]             Skin: rash, swelling[  ];, hair loss[  ];  peripheral edema[  ];  or itching[  ]; Musculosketetal: myalgias[  ];  joint swelling[  ];  joint erythema[  ];  joint pain[  ];  back pain[  ];  Heme/Lymph: bruising[  ];  bleeding[  ];  anemia[  ];  Neuro: TIA[n  ];  headaches[  ];  stroke[n  ];  vertigo[  ];  seizures[n  ];   paresthesias[  ];  difficulty walking[  ];  Psych:depression[  ]; anxiety[  ];  Endocrine: diabetes[  ];  thyroid dysfunction[y  ];  Immunizations: Flu up to date [  ]; Pneumococcal up to date [  ];  Other:  Physical Exam: BP 134/84 mmHg  Pulse 64  Resp 20  Ht 4' 10" (1.473 m)  Wt 114 lb (51.71 kg)  BMI 23.83 kg/m2  SpO2 98%  PHYSICAL EXAMINATION: General appearance: alert, cooperative, appears older than stated age and no distress Head: Normocephalic, without obvious abnormality, atraumatic Neck: no adenopathy, no carotid bruit, no JVD, supple, symmetrical, trachea midline, thyroid not enlarged, symmetric, no tenderness/mass/nodules and short neck some webbing of neck Lymph nodes: Cervical, supraclavicular, and axillary nodes normal. Resp: clear to auscultation bilaterally Back: symmetric, no curvature. ROM normal. No CVA tenderness., short statue with siginificant scoliosis  Cardio: regular rate and rhythm, S1, S2 normal, 2/6 diastolic murmur , click, rub or gallop GI: soft, non-tender; bowel sounds normal; no masses,  no organomegaly Extremities: extremities normal, atraumatic, no cyanosis or edema Neurologic: Grossly normal  Diagnostic Studies & Laboratory data:     Recent Radiology Findings:  CLINICAL DATA: 74-year-old female with dilated ascending thoracic aorta on echocardiogram  EXAM: CT ANGIOGRAPHY CHEST WITH CONTRAST  TECHNIQUE: Multidetector CT imaging of the chest was performed using the standard protocol during bolus administration of  intravenous contrast. Multiplanar CT image reconstructions and MIPs were obtained to evaluate the vascular anatomy.  CONTRAST: 100mL OMNIPAQUE IOHEXOL 350 MG/ML SOLN  COMPARISON: Prior CT abdomen/ pelvis 01/16/2014  FINDINGS: Mediastinum: 1.8 cm heterogeneous enhancing nodule within the right thyroid gland. The remainder the thoracic inlet is unremarkable. No mediastinal mass or suspicious adenopathy. The thoracic esophagus is within normal limits.  Heart/Vascular: Significant aneurysmal dilatation of the ascending and transverse thoracic aorta. The ascending thoracic aorta measures up to 5.3 cm in diameter. The transverse aorta measures up to 5.1 cm in diameter. 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 Mild atherosclerotic plaque without evidence of significant stenosis. No effacement of the sino-tubular junction. The aortic root is within normal limits. The heart is normal in size. Calcifications present along the course of the coronary arteries. The main pulmonary artery is normal in size. No evidence of central pulmonary embolus.  Lungs/Pleura: Trace dependent atelectasis   in the lower lobes. No suspicious pulmonary nodule or mass. No edema, pleural effusion, pneumothorax or focal consolidation.  Bones/Soft Tissues: No acute fracture or aggressive appearing lytic or blastic osseous lesion.  Upper Abdomen: Visualized upper abdominal organs are unremarkable.  Review of the MIP images confirms the above findings.  IMPRESSION: VASCULAR  1. 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 Ascending thoracic aortic aneurysm. Recommend semi-annual imaging followup by CTA or MRA and referral to cardiothoracic surgery if not already obtained. This recommendation follows  2010 ACCF/AHA/AATS/ACR/ASA/SCA/SCAI/SIR/STS/SVM Guidelines for the Diagnosis and Management of Patients With Thoracic Aortic Disease. Circulation. 2010; 121: e266-e369 2. 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 3. Fusiform aneurysmal dilatation of the aortic arch measuring up to 5.1 cm in diameter. 4. Atherosclerotic vascular calcifications including coronary artery calcifications. NON VASCULAR  1. Incidental note is made of a 1.8 cm right-sided thyroid nodule. Further evaluation with dedicated thyroid ultrasound could be performed if clinically warranted. 2. Otherwise, unremarkable CT scan of the chest.  Signed,  Heath K. McCullough, MD  [facility]  [facility]   Electronically Signed  By: Heath McCullough M.D.  On: 04/29/2015 16:03 I have independently reviewed the above radiology studies  and reviewed the findings with the patient.   ECHO: ATTENDING Traci Turner, MD SONOGRAPHER Billy Chung, RDCS ORDERING Kenneth Hilty MD REFERRING Kenneth Hilty MD PERFORMING [facility], Outpatient  cc:  ------------------------------------------------------------------- LV EF: 60% - 65%  ------------------------------------------------------------------- Indications: Aortic insufficiency (I35.1).  ------------------------------------------------------------------- History: Risk factors: Hypertension. Dyslipidemia.  ------------------------------------------------------------------- Study Conclusions  - Left ventricle: The cavity size was normal. Systolic function was  normal. The estimated ejection fraction was in the range of 60%  to 65%. Wall motion was normal; there were no regional wall  motion abnormalities. There was an increased relative   contribution of atrial contraction to ventricular filling.  Doppler parameters are consistent with abnormal left ventricular  relaxation (grade 1 diastolic dysfunction). - Aortic valve: Trileaflet; normal thickness, mildly calcified  leaflets. There was mild to moderate regurgitation directed  eccentrically in the LVOT and towards the mitral anterior  leaflet. - Aorta: Ascending aortic diameter: 51 mm (S). - Ascending aorta: The ascending aorta was severely dilated.  Impressions:  - Normal LVF with grade 1 diastolic dysfunction. AV leaflets are  mildly calcified. No AS. There is mild to moderate AI  eccentrically directed towards the anterior mitral valve leaflet.  The ascending aorta is dilated with measurements ranging from 48  to 51mm in diameter. Recommend Chest CT to get more accurate  assessment.  Transthoracic echocardiography. M-mode, complete 2D, spectral Doppler, and color Doppler. Birthdate: Patient birthdate: 09/29/1940. Age: Patient is 74 yr old. Sex: Gender: female. BMI: 23.6 kg/m^2. Blood pressure: 124/76 Patient status: Outpatient. Study date: Study date: 04/15/2015. Study time: 02:18 PM. Location: [facility] [facility]  -------------------------------------------------------------------  ------------------------------------------------------------------- Left ventricle: The cavity size was normal. Systolic function was normal. The estimated ejection fraction was in the range of 60% to 65%. Wall motion was normal; there were no regional wall motion abnormalities. There was an increased relative contribution of atrial contraction to ventricular filling. Doppler parameters are consistent with abnormal left ventricular relaxation (grade 1 diastolic dysfunction).  ------------------------------------------------------------------- Aortic valve: Trileaflet; normal thickness, mildly calcified leaflets. Mobility was not restricted.  Doppler: Transvalvular velocity was within the normal range. There was   no stenosis. There was mild to moderate regurgitation directed eccentrically in the LVOT and towards the mitral anterior leaflet.  ------------------------------------------------------------------- Aorta: Aortic root: The aortic root was normal in size. Ascending aorta: The ascending aorta was severely dilated.  ------------------------------------------------------------------- Mitral valve: Structurally normal valve. Mobility was not restricted. Doppler: Transvalvular velocity was within the normal range. There was no evidence for stenosis. There was no regurgitation. Peak gradient (D): 2 mm Hg.  ------------------------------------------------------------------- Left atrium: The atrium was normal in size.  ------------------------------------------------------------------- Right ventricle: The cavity size was normal. Wall thickness was normal. Systolic function was normal.  ------------------------------------------------------------------- Pulmonic valve: Structurally normal valve. Cusp separation was normal. Doppler: Transvalvular velocity was within the normal range. There was no evidence for stenosis. There was trivial regurgitation.  ------------------------------------------------------------------- Tricuspid valve: Structurally normal valve. Doppler: Transvalvular velocity was within the normal range. There was no regurgitation.  ------------------------------------------------------------------- Pulmonary artery: The main pulmonary artery was normal-sized. Systolic pressure could not be accurately estimated.  ------------------------------------------------------------------- Right atrium: The atrium was normal in size.  ------------------------------------------------------------------- Pericardium: There was no pericardial  effusion.  ------------------------------------------------------------------- Systemic veins: Inferior vena cava: The vessel was normal in size.  ------------------------------------------------------------------- Measurements  Left ventricle Value Reference LV ID, ED, PLAX chordal (L) 36.9 mm 43 - 52 LV ID, ES, PLAX chordal 24.1 mm 23 - 38 LV fx shortening, PLAX chordal 35 % >=29 LV PW thickness, ED 8.8 mm --------- IVS/LV PW ratio, ED 0.91 <=1.3 Stroke volume, 2D 91 ml --------- Stroke volume/bsa, 2D 62 ml/m^2 --------- LV ejection fraction, 1-p A4C 65 % --------- LV end-diastolic volume, 2-p 48 ml --------- LV end-systolic volume, 2-p 16 ml --------- LV ejection fraction, 2-p 67 % --------- Stroke volume, 2-p 32 ml --------- LV end-diastolic volume/bsa, 2-p 32 ml/m^2 --------- LV end-systolic volume/bsa, 2-p 11 ml/m^2 --------- Stroke volume/bsa, 2-p 21.7 ml/m^2 --------- LV e&', lateral 7.33 cm/s --------- LV E/e&', lateral 10.33 --------- LV e&', medial 4.81 cm/s --------- LV E/e&', medial 15.74 --------- LV e&', average 6.07 cm/s --------- LV E/e&', average 12.47 ---------  Ventricular septum Value Reference IVS thickness, ED 8.01 mm ---------  LVOT Value Reference LVOT ID, S 20 mm  --------- LVOT area 3.14 cm^2 --------- LVOT peak velocity, S 145 cm/s --------- LVOT mean velocity, S 81.7 cm/s --------- LVOT VTI, S 29 cm --------- LVOT peak gradient, S 8 mm Hg ---------  Aorta Value Reference Aortic root ID, ED 33 mm --------- Ascending aorta ID, A-P, S 43 mm ---------  Left atrium Value Reference LA ID, A-P, ES 38 mm --------- LA ID/bsa, A-P (H) 2.57 cm/m^2 <=2.2 LA volume, S 48 ml --------- LA volume/bsa, S 32.5 ml/m^2 --------- LA volume, ES, 1-p A4C 44 ml --------- LA volume/bsa, ES, 1-p A4C 29.8 ml/m^2 --------- LA volume, ES, 1-p A2C 52 ml --------- LA volume/bsa, ES, 1-p A2C 35.2 ml/m^2 ---------  Mitral valve Value Reference Mitral E-wave peak velocity 75.7 cm/s --------- Mitral A-wave peak velocity 124 cm/s --------- Mitral deceleration time (H) 380 ms 150 - 230 Mitral peak gradient, D 2 mm Hg --------- Mitral E/A ratio, peak 0.6 ---------  Systemic veins Value Reference Estimated CVP 3 mm Hg ---------  Right ventricle Value Reference RV s&', lateral, S 13.1 cm/s ---------  Pulmonic valve Value Reference Pulmonic regurg velocity, ED 121 cm/s  --------- Pulmonic regurg gradient, ED 6 mm Hg ---------  Legend: (L) and (H) mark values outside specified reference range.  ------------------------------------------------------------------- Prepared and Electronically Authenticated by  Traci Turner, MD 2017-04-06T15:18:50   NO Recent Cardiac Cath    Recent Lab Findings: Lab Results  Component Value Date   WBC 15.5* 10/11/2009   HGB 17.0* 10/11/2009   HCT 48.7* 10/11/2009   PLT 330 10/11/2009   GLUCOSE 144* 10/11/2009   ALT 18 10/11/2009   AST 21 10/11/2009   NA 131* 10/11/2009   K 3.3* 10/11/2009   CL 94* 10/11/2009   CREATININE 0.64 04/19/2015   BUN 21 04/19/2015   CO2 25 10/11/2009   Aortic Size Index=    5.3     /Body surface area is 1.45 meters squared. = 3.65   < 2.75 cm/m2      4% risk per year 2.75 to 4.25          8% risk per year > 4.25 cm/m2    20% risk per year     Assessment / Plan:   Patient with Turners Syndrome, enlarging ascending  aortic aneurysm with involvement of the arch  And moderate AI 4.8 to 5.3 since 2011. With her Tuners syndrome, small stature and enlarged aorta she is at increased risk of aortic dissection. I agree with Dr Hendrickson that surgical repair should be offered to the patient. Risks and options dicussed with th patient and he brother. She will need cardia cath prior to surgery.      I  spent 40 minutes counseling the patient face to face and 50% or more the  time was spent in counseling and coordination of care. The total time spent in the appointment was 60 minutes.  Roselani Grajeda B Ronnae Kaser MD      [address] [place] [address] Office [phone]   Beeper [phone]  05/31/2015 9:56 AM        

## 2015-06-01 ENCOUNTER — Encounter: Payer: Self-pay | Admitting: Interventional Cardiology

## 2015-06-01 ENCOUNTER — Other Ambulatory Visit: Payer: Self-pay | Admitting: *Deleted

## 2015-06-01 DIAGNOSIS — Z79899 Other long term (current) drug therapy: Secondary | ICD-10-CM

## 2015-06-01 DIAGNOSIS — I719 Aortic aneurysm of unspecified site, without rupture: Secondary | ICD-10-CM

## 2015-06-01 DIAGNOSIS — Z01810 Encounter for preprocedural cardiovascular examination: Secondary | ICD-10-CM

## 2015-06-01 DIAGNOSIS — D689 Coagulation defect, unspecified: Secondary | ICD-10-CM

## 2015-06-01 DIAGNOSIS — R5383 Other fatigue: Secondary | ICD-10-CM

## 2015-06-02 LAB — CBC
HEMATOCRIT: 45 % (ref 35.0–45.0)
Hemoglobin: 15.8 g/dL — ABNORMAL HIGH (ref 11.7–15.5)
MCH: 31.5 pg (ref 27.0–33.0)
MCHC: 35.1 g/dL (ref 32.0–36.0)
MCV: 89.8 fL (ref 80.0–100.0)
MPV: 9.2 fL (ref 7.5–12.5)
Platelets: 240 10*3/uL (ref 140–400)
RBC: 5.01 MIL/uL (ref 3.80–5.10)
RDW: 12.8 % (ref 11.0–15.0)
WBC: 9.3 10*3/uL (ref 3.8–10.8)

## 2015-06-02 LAB — PROTIME-INR
INR: 0.97 (ref <1.50)
Prothrombin Time: 13 seconds (ref 11.6–15.2)

## 2015-06-02 LAB — BASIC METABOLIC PANEL
BUN: 16 mg/dL (ref 7–25)
CHLORIDE: 99 mmol/L (ref 98–110)
CO2: 24 mmol/L (ref 20–31)
Calcium: 8.8 mg/dL (ref 8.6–10.4)
Creat: 0.6 mg/dL (ref 0.60–0.93)
GLUCOSE: 94 mg/dL (ref 65–99)
POTASSIUM: 4.4 mmol/L (ref 3.5–5.3)
Sodium: 134 mmol/L — ABNORMAL LOW (ref 135–146)

## 2015-06-02 LAB — APTT: aPTT: 32 seconds (ref 24–37)

## 2015-06-02 LAB — TSH: TSH: 2.87 mIU/L

## 2015-06-03 ENCOUNTER — Encounter (HOSPITAL_COMMUNITY): Payer: Self-pay | Admitting: Interventional Cardiology

## 2015-06-03 ENCOUNTER — Ambulatory Visit (HOSPITAL_COMMUNITY)
Admission: RE | Admit: 2015-06-03 | Discharge: 2015-06-03 | Disposition: A | Discharge status: Home / Self Care | Payer: Medicare Other | Source: Ambulatory Visit | Attending: Interventional Cardiology | Admitting: Interventional Cardiology

## 2015-06-03 ENCOUNTER — Encounter (HOSPITAL_COMMUNITY)
Admission: RE | Disposition: A | Discharge status: Home / Self Care | Payer: Self-pay | Source: Ambulatory Visit | Attending: Interventional Cardiology

## 2015-06-03 DIAGNOSIS — Q969 Turner's syndrome, unspecified: Secondary | ICD-10-CM | POA: Insufficient documentation

## 2015-06-03 DIAGNOSIS — I10 Essential (primary) hypertension: Secondary | ICD-10-CM | POA: Diagnosis not present

## 2015-06-03 DIAGNOSIS — I2510 Atherosclerotic heart disease of native coronary artery without angina pectoris: Secondary | ICD-10-CM | POA: Insufficient documentation

## 2015-06-03 DIAGNOSIS — E785 Hyperlipidemia, unspecified: Secondary | ICD-10-CM | POA: Diagnosis not present

## 2015-06-03 DIAGNOSIS — E039 Hypothyroidism, unspecified: Secondary | ICD-10-CM | POA: Diagnosis not present

## 2015-06-03 DIAGNOSIS — Z79899 Other long term (current) drug therapy: Secondary | ICD-10-CM | POA: Diagnosis not present

## 2015-06-03 DIAGNOSIS — Z87891 Personal history of nicotine dependence: Secondary | ICD-10-CM | POA: Diagnosis not present

## 2015-06-03 DIAGNOSIS — H409 Unspecified glaucoma: Secondary | ICD-10-CM | POA: Diagnosis not present

## 2015-06-03 DIAGNOSIS — I712 Thoracic aortic aneurysm, without rupture: Secondary | ICD-10-CM | POA: Diagnosis not present

## 2015-06-03 DIAGNOSIS — E559 Vitamin D deficiency, unspecified: Secondary | ICD-10-CM | POA: Diagnosis not present

## 2015-06-03 DIAGNOSIS — Z01810 Encounter for preprocedural cardiovascular examination: Secondary | ICD-10-CM | POA: Insufficient documentation

## 2015-06-03 DIAGNOSIS — I351 Nonrheumatic aortic (valve) insufficiency: Secondary | ICD-10-CM | POA: Insufficient documentation

## 2015-06-03 DIAGNOSIS — Z7983 Long term (current) use of bisphosphonates: Secondary | ICD-10-CM | POA: Diagnosis not present

## 2015-06-03 DIAGNOSIS — I7121 Aneurysm of the ascending aorta, without rupture: Secondary | ICD-10-CM | POA: Insufficient documentation

## 2015-06-03 HISTORY — PX: CARDIAC CATHETERIZATION: SHX172

## 2015-06-03 SURGERY — LEFT HEART CATH AND CORONARY ANGIOGRAPHY

## 2015-06-03 MED ORDER — SODIUM CHLORIDE 0.9% FLUSH: 3.0000 mL | INTRAVENOUS | Status: DC | PRN

## 2015-06-03 MED ORDER — SODIUM CHLORIDE 0.9% FLUSH: 3.0000 mL | Freq: Two times a day (BID) | INTRAVENOUS | Status: DC

## 2015-06-03 MED ORDER — LIDOCAINE HCL (PF) 1 % IJ SOLN
INTRAMUSCULAR | Status: DC | PRN
  Administered 2015-06-03: 4 mL

## 2015-06-03 MED ORDER — IOPAMIDOL (ISOVUE-370) INJECTION 76%
INTRAVENOUS | Status: AC
  Filled 2015-06-03: qty 100

## 2015-06-03 MED ORDER — SODIUM CHLORIDE 0.9 % WEIGHT BASED INFUSION: 1.0000 mL/kg/h | INTRAVENOUS | Status: DC

## 2015-06-03 MED ORDER — HEPARIN SODIUM (PORCINE) 1000 UNIT/ML IJ SOLN
INTRAMUSCULAR | Status: DC | PRN
Start: 2015-06-03 — End: 2015-06-03
  Administered 2015-06-03: 3000 [IU] via INTRAVENOUS

## 2015-06-03 MED ORDER — ASPIRIN 81 MG PO CHEW
81.0000 mg | CHEWABLE_TABLET | ORAL | Status: AC
  Administered 2015-06-03: 81 mg via ORAL

## 2015-06-03 MED ORDER — VERAPAMIL HCL 2.5 MG/ML IV SOLN
INTRAVENOUS | Status: AC
  Filled 2015-06-03: qty 2

## 2015-06-03 MED ORDER — MIDAZOLAM HCL 2 MG/2ML IJ SOLN
INTRAMUSCULAR | Status: AC
  Filled 2015-06-03: qty 2

## 2015-06-03 MED ORDER — SODIUM CHLORIDE 0.9 % IV SOLN: 250.0000 mL | INTRAVENOUS | Status: DC | PRN

## 2015-06-03 MED ORDER — ASPIRIN 81 MG PO CHEW
CHEWABLE_TABLET | ORAL | Status: AC
  Administered 2015-06-03: 81 mg via ORAL
  Filled 2015-06-03: qty 1

## 2015-06-03 MED ORDER — HEPARIN (PORCINE) IN NACL 2-0.9 UNIT/ML-% IJ SOLN
INTRAMUSCULAR | Status: AC
  Filled 2015-06-03: qty 1000

## 2015-06-03 MED ORDER — HEPARIN (PORCINE) IN NACL 2-0.9 UNIT/ML-% IJ SOLN
INTRAMUSCULAR | Status: DC | PRN
  Administered 2015-06-03: 10:00:00

## 2015-06-03 MED ORDER — MIDAZOLAM HCL 2 MG/2ML IJ SOLN
INTRAMUSCULAR | Status: DC | PRN
  Administered 2015-06-03: 1 mg via INTRAVENOUS

## 2015-06-03 MED ORDER — SODIUM CHLORIDE 0.9 % IV SOLN
INTRAVENOUS | Status: DC
  Administered 2015-06-03: 08:00:00 via INTRAVENOUS

## 2015-06-03 MED ORDER — HEPARIN SODIUM (PORCINE) 1000 UNIT/ML IJ SOLN
INTRAMUSCULAR | Status: AC
  Filled 2015-06-03: qty 1

## 2015-06-03 MED ORDER — VERAPAMIL HCL 2.5 MG/ML IV SOLN
INTRAVENOUS | Status: DC | PRN
  Administered 2015-06-03 (×2): via INTRA_ARTERIAL

## 2015-06-03 MED ORDER — IOPAMIDOL (ISOVUE-370) INJECTION 76%
INTRAVENOUS | Status: DC | PRN
  Administered 2015-06-03: 70 mL via INTRA_ARTERIAL

## 2015-06-03 MED ORDER — LIDOCAINE HCL (PF) 1 % IJ SOLN
INTRAMUSCULAR | Status: AC
  Filled 2015-06-03: qty 30

## 2015-06-03 MED ORDER — FENTANYL CITRATE (PF) 100 MCG/2ML IJ SOLN
INTRAMUSCULAR | Status: AC
  Filled 2015-06-03: qty 2

## 2015-06-03 MED ORDER — FENTANYL CITRATE (PF) 100 MCG/2ML IJ SOLN
INTRAMUSCULAR | Status: DC | PRN
  Administered 2015-06-03: 25 ug via INTRAVENOUS

## 2015-06-03 SURGICAL SUPPLY — 10 items
CATH INFINITI 5FR MULTPACK ANG (CATHETERS) ×2 IMPLANT
DEVICE RAD COMP TR BAND LRG (VASCULAR PRODUCTS) ×2 IMPLANT
GLIDESHEATH SLEND SS 6F .021 (SHEATH) ×2 IMPLANT
KIT HEART LEFT (KITS) ×3 IMPLANT
PACK CARDIAC CATHETERIZATION (CUSTOM PROCEDURE TRAY) ×3 IMPLANT
SYR MEDRAD MARK V 150ML (SYRINGE) ×3 IMPLANT
TRANSDUCER W/STOPCOCK (MISCELLANEOUS) ×3 IMPLANT
TUBING CIL FLEX 10 FLL-RA (TUBING) ×3 IMPLANT
WIRE HI TORQ VERSACORE-J 145CM (WIRE) ×2 IMPLANT
WIRE SAFE-T 1.5MM-J .035X260CM (WIRE) ×2 IMPLANT

## 2015-06-03 NOTE — Interval H&P Note (Signed)
Cath Lab Visit (complete for each Cath Lab visit)  Clinical Evaluation Leading to the Procedure:   ACS: No.  Non-ACS:    Anginal Classification: No Symptoms  Anti-ischemic medical therapy: Minimal Therapy (1 class of medications)  Non-Invasive Test Results: No non-invasive testing performed  Prior CABG: No previous CABG      History and Physical Interval Note:  06/03/2015 10:22 AM  Kristi Erickson  has presented today for surgery, with the diagnosis of pre-op  The various methods of treatment have been discussed with the patient and family. After consideration of risks, benefits and other options for treatment, the patient has consented to  Procedure(s): Left Heart Cath and Coronary Angiography (N/A) as a surgical intervention .  The patient's history has been reviewed, patient examined, no change in status, stable for surgery.  I have reviewed the patient's chart and labs.  Questions were answered to the patient's satisfaction.     Lance MussJayadeep Makynli Stills

## 2015-06-03 NOTE — H&P (View-Only) (Signed)
[address]       [place] [address]             [phone]                    Kristi Erickson [facility] Medical Record #[id number] Date of Birth: March 13, 1940  Referring: Chrystie Nose, MD Primary Care: Gaye Alken, MD  Chief Complaint:    Chief Complaint  Patient presents with  . Thoracic Aortic Aneurysm    surgical eval, 2nd opinion    History of Present Illness:    Kristi Erickson 75 y.o. female is seen in the office  today for second opinion   concerning replacement  ascending aorta and arch.  Patient has  history of Aortic Regurgitation, HTN, HLD, Hypothyroidism, Turner's Syndrome, Horseshoe Kidney, GERD, Glaucoma.  . She was seen about four years ago by Dr. Rennis Golden for aortic regurgitation. Echocardiogram 08/18/2011 revealed Normal LV function and parameters. Trileaflet Aortic Valve with Moderate Regurgitation and mild calcification. Abdominal CT 2011 showed dilated ascending aorta partially imaged at 4.9 cm.  She does  not have any anginal symptoms or  any shortness of breath at rest,  She does have  shortness of breath walking briskly or climbing stairs . S  She was sent for an echocardiogram which demonstrated an LVEF of 60-65% with mild to moderate AI and a dilated aortic root measuring between 4.8 and 5.1 cm. Based on these findings a formal  CT aortogram of full chest was done on 04/29/2015. This demonstrated aneurysm of the ascending aorta, measuring up to 5.3 cm.   Current Activity/ Functional Status:  Patient is independent with mobility/ambulation, transfers, ADL's, IADL's.   Zubrod Score: At the time of surgery this patient's most appropriate activity status/level should be described as: []     0    Normal activity, no symptoms []     1    Restricted in physical strenuous activity but ambulatory, able to do out light work [x]     2    Ambulatory and capable of self care, unable to do work activities, up and about                >50 % of waking hours                              []     3    Only limited self care, in bed greater than 50% of waking hours []     4    Completely disabled, no self care, confined to bed or chair []     5    Moribund   Past Medical History  Diagnosis Date  . Vision disorder   . Stomach problems   . Vitamin D deficiency   . Turner's syndrome   . GERD (gastroesophageal reflux disease)   . Glaucoma   . Hypertension   . Hyperlipidemia   . Osteoporosis     Past Surgical History  Procedure Laterality Date  . Other surgical history  2010     ear surgery  . Abdominal surgery  1995  . Transthoracic echocardiogram  08/2011    EF=/> 55%, LA mildly dilated, midl mitral annular calcification, trace MR, trace TR, mild AV calcification w/mod regurg, trace pulm valve regurg    Family History  Problem Relation Age of Onset  . Heart attack Maternal Grandfather   . Heart attack  Father   . Skin cancer Brother     Social History   Social History  . Marital Status: Single    Spouse Name: N/A  . Number of Children: N/A  . Years of Education: M.A.   Occupational History  . Not on file.   Social History Main Topics  . Smoking status: Former Smoker -- 3 years  . Smokeless tobacco: Never Used  . Alcohol Use: 0.0 oz/week    0 Standard drinks or equivalent per week     Comment: 1-2 glasses of wine/week  . Drug Use: No  . Sexual Activity: Not on file   Other Topics Concern  . Not on file   Social History Narrative    History  Smoking status  . Former Smoker -- 3 years  Smokeless tobacco  . Never Used    History  Alcohol Use  . 0.0 oz/week  . 0 Standard drinks or equivalent per week    Comment: 1-2 glasses of wine/week     Allergies  Allergen Reactions  . Shellfish-Derived Products Nausea And Vomiting    Clam, oyster    Current Outpatient Prescriptions  Medication Sig Dispense Refill  . acetaminophen (TYLENOL) 500 MG tablet Take 500 mg by mouth every morning.      Marland Kitchen alendronate (FOSAMAX) 70 MG tablet Take 1 tablet by mouth once a week.    . Ascorbic Acid (VITAMIN C) 1000 MG tablet Take 4,000 mg by mouth daily.    . Cholecalciferol (VITAMIN D) 2000 units CAPS Take 1 capsule by mouth daily.    Marland Kitchen dicyclomine (BENTYL) 20 MG tablet Take 20 mg by mouth 3 (three) times daily as needed.  0  . diltiazem (DILACOR XR) 180 MG 24 hr capsule Take 1 capsule by mouth daily.    . dorzolamide (TRUSOPT) 2 % ophthalmic solution Place 1 drop into both eyes 2 (two) times daily.    Marland Kitchen levothyroxine (SYNTHROID, LEVOTHROID) 88 MCG tablet Take 1 tablet by mouth every morning.    . mirtazapine (REMERON) 45 MG tablet Take 1 tablet by mouth daily.    . Multiple Vitamin (MULTIVITAMIN) tablet Take 1 tablet by mouth daily.    . quinapril (ACCUPRIL) 40 MG tablet Take 1 tablet by mouth daily.    . simvastatin (ZOCOR) 40 MG tablet Take 1 tablet by mouth daily.    . temazepam (RESTORIL) 15 MG capsule Take 1 capsule by mouth at bedtime.     No current facility-administered medications for this visit.      Review of Systems:     Cardiac Review of Systems: Y or N  Chest Pain [ n   ]  Resting SOB [  n ] Exertional SOB  [ y ]  Pollyann Kennedy Milo.Brash  ]   Pedal Edema [n   ]    Palpitations Milo.Brash  ] Syncope  [ n ]   Presyncope [ n  ]  General Review of Systems: [Y] = yes [  ]=no Constitional: recent weight change [ n ];  Wt loss over the last 3 months [   ] anorexia [  ]; fatigue [  ]; nausea [  ]; night sweats [  ]; fever [  ]; or chills [  ];          Dental: poor dentition[  ]; Last Dentist visit:   Eye : blurred vision [  ]; diplopia [   ]; vision changes [  ];  Amaurosis fugax[  ]; Resp: cough [  ];  wheezing[  ];  hemoptysis[  ]; shortness of breath[  ]; paroxysmal nocturnal dyspnea[  ]; dyspnea on exertion[  ]; or orthopnea[  ];  GI:  gallstones[  ], vomiting[  ];  dysphagia[  ]; melena[  ];  hematochezia [  ]; heartburn[  ];   Hx of  Colonoscopy[  ]; GU: kidney stones [  ]; hematuria[  ];    dysuria [  ];  nocturia[  ];  history of     obstruction [  ]; urinary frequency [  ]             Skin: rash, swelling[  ];, hair loss[  ];  peripheral edema[  ];  or itching[  ]; Musculosketetal: myalgias[  ];  joint swelling[  ];  joint erythema[  ];  joint pain[  ];  back pain[  ];  Heme/Lymph: bruising[  ];  bleeding[  ];  anemia[  ];  Neuro: TIA[n  ];  headaches[  ];  stroke[n  ];  vertigo[  ];  seizures[n  ];   paresthesias[  ];  difficulty walking[  ];  Psych:depression[  ]; anxiety[  ];  Endocrine: diabetes[  ];  thyroid dysfunction[y  ];  Immunizations: Flu up to date [  ]; Pneumococcal up to date [  ];  Other:  Physical Exam: BP 134/84 mmHg  Pulse 64  Resp 20  Ht 4\' 10"  (1.473 m)  Wt 114 lb (51.71 kg)  BMI 23.83 kg/m2  SpO2 98%  PHYSICAL EXAMINATION: General appearance: alert, cooperative, appears older than stated age and no distress Head: Normocephalic, without obvious abnormality, atraumatic Neck: no adenopathy, no carotid bruit, no JVD, supple, symmetrical, trachea midline, thyroid not enlarged, symmetric, no tenderness/mass/nodules and short neck some webbing of neck Lymph nodes: Cervical, supraclavicular, and axillary nodes normal. Resp: clear to auscultation bilaterally Back: symmetric, no curvature. ROM normal. No CVA tenderness., short statue with siginificant scoliosis  Cardio: regular rate and rhythm, S1, S2 normal, 2/6 diastolic murmur , click, rub or gallop GI: soft, non-tender; bowel sounds normal; no masses,  no organomegaly Extremities: extremities normal, atraumatic, no cyanosis or edema Neurologic: Grossly normal  Diagnostic Studies & Laboratory data:     Recent Radiology Findings:  CLINICAL DATA: 75 year old female with dilated ascending thoracic aorta on echocardiogram  EXAM: CT ANGIOGRAPHY CHEST WITH CONTRAST  TECHNIQUE: Multidetector CT imaging of the chest was performed using the standard protocol during bolus administration of  intravenous contrast. Multiplanar CT image reconstructions and MIPs were obtained to evaluate the vascular anatomy.  CONTRAST: 100mL OMNIPAQUE IOHEXOL 350 MG/ML SOLN  COMPARISON: Prior CT abdomen/ pelvis 01/16/2014  FINDINGS: Mediastinum: 1.8 cm heterogeneous enhancing nodule within the right thyroid gland. The remainder the thoracic inlet is unremarkable. No mediastinal mass or suspicious adenopathy. The thoracic esophagus is within normal limits.  Heart/Vascular: Significant aneurysmal dilatation of the ascending and transverse thoracic aorta. The ascending thoracic aorta measures up to 5.3 cm in diameter. The transverse aorta measures up to 5.1 cm in diameter. 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 Mild atherosclerotic plaque without evidence of significant stenosis. No effacement of the sino-tubular junction. The aortic root is within normal limits. The heart is normal in size. Calcifications present along the course of the coronary arteries. The main pulmonary artery is normal in size. No evidence of central pulmonary embolus.  Lungs/Pleura: Trace dependent atelectasis  in the lower lobes. No suspicious pulmonary nodule or mass. No edema, pleural effusion, pneumothorax or focal consolidation.  Bones/Soft Tissues: No acute fracture or aggressive appearing lytic or blastic osseous lesion.  Upper Abdomen: Visualized upper abdominal organs are unremarkable.  Review of the MIP images confirms the above findings.  IMPRESSION: VASCULAR  1. 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 Ascending thoracic aortic aneurysm. Recommend semi-annual imaging followup by CTA or MRA and referral to cardiothoracic surgery if not already obtained. This recommendation follows  2010 ACCF/AHA/AATS/ACR/ASA/SCA/SCAI/SIR/STS/SVM Guidelines for the Diagnosis and Management of Patients With Thoracic Aortic Disease. Circulation. 2010; 121: Z610-R604 2. 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 3. Fusiform aneurysmal dilatation of the aortic arch measuring up to 5.1 cm in diameter. 4. Atherosclerotic vascular calcifications including coronary artery calcifications. NON VASCULAR  1. Incidental note is made of a 1.8 cm right-sided thyroid nodule. Further evaluation with dedicated thyroid ultrasound could be performed if clinically warranted. 2. Otherwise, unremarkable CT scan of the chest.  Signed,  Sterling Big, MD  [facility]  [facility]   Electronically Signed  By: Malachy Moan M.D.  On: 04/29/2015 16:03 I have independently reviewed the above radiology studies  and reviewed the findings with the patient.   ECHO: ATTENDING Armanda Magic, MD SONOGRAPHER Dewitt Hoes, RDCS ORDERING Zoila Shutter MD REFERRING Zoila Shutter MD PERFORMING [facility], Outpatient  cc:  ------------------------------------------------------------------- LV EF: 60% - 65%  ------------------------------------------------------------------- Indications: Aortic insufficiency (I35.1).  ------------------------------------------------------------------- History: Risk factors: Hypertension. Dyslipidemia.  ------------------------------------------------------------------- Study Conclusions  - Left ventricle: The cavity size was normal. Systolic function was  normal. The estimated ejection fraction was in the range of 60%  to 65%. Wall motion was normal; there were no regional wall  motion abnormalities. There was an increased relative   contribution of atrial contraction to ventricular filling.  Doppler parameters are consistent with abnormal left ventricular  relaxation (grade 1 diastolic dysfunction). - Aortic valve: Trileaflet; normal thickness, mildly calcified  leaflets. There was mild to moderate regurgitation directed  eccentrically in the LVOT and towards the mitral anterior  leaflet. - Aorta: Ascending aortic diameter: 51 mm (S). - Ascending aorta: The ascending aorta was severely dilated.  Impressions:  - Normal LVF with grade 1 diastolic dysfunction. AV leaflets are  mildly calcified. No AS. There is mild to moderate AI  eccentrically directed towards the anterior mitral valve leaflet.  The ascending aorta is dilated with measurements ranging from 48  to 51mm in diameter. Recommend Chest CT to get more accurate  assessment.  Transthoracic echocardiography. M-mode, complete 2D, spectral Doppler, and color Doppler. Birthdate: Patient birthdate: 1940-11-20. Age: Patient is 75 yr old. Sex: Gender: female. BMI: 23.6 kg/m^2. Blood pressure: 124/76 Patient status: Outpatient. Study date: Study date: 04/15/2015. Study time: 02:18 PM. Location: [place] [facility]  -------------------------------------------------------------------  ------------------------------------------------------------------- Left ventricle: The cavity size was normal. Systolic function was normal. The estimated ejection fraction was in the range of 60% to 65%. Wall motion was normal; there were no regional wall motion abnormalities. There was an increased relative contribution of atrial contraction to ventricular filling. Doppler parameters are consistent with abnormal left ventricular relaxation (grade 1 diastolic dysfunction).  ------------------------------------------------------------------- Aortic valve: Trileaflet; normal thickness, mildly calcified leaflets. Mobility was not restricted.  Doppler: Transvalvular velocity was within the normal range. There was  no stenosis. There was mild to moderate regurgitation directed eccentrically in the LVOT and towards the mitral anterior leaflet.  ------------------------------------------------------------------- Aorta: Aortic root: The aortic root was normal in size. Ascending aorta: The ascending aorta was severely dilated.  ------------------------------------------------------------------- Mitral valve: Structurally normal valve. Mobility was not restricted. Doppler: Transvalvular velocity was within the normal range. There was no evidence for stenosis. There was no regurgitation. Peak gradient (D): 2 mm Hg.  ------------------------------------------------------------------- Left atrium: The atrium was normal in size.  ------------------------------------------------------------------- Right ventricle: The cavity size was normal. Wall thickness was normal. Systolic function was normal.  ------------------------------------------------------------------- Pulmonic valve: Structurally normal valve. Cusp separation was normal. Doppler: Transvalvular velocity was within the normal range. There was no evidence for stenosis. There was trivial regurgitation.  ------------------------------------------------------------------- Tricuspid valve: Structurally normal valve. Doppler: Transvalvular velocity was within the normal range. There was no regurgitation.  ------------------------------------------------------------------- Pulmonary artery: The main pulmonary artery was normal-sized. Systolic pressure could not be accurately estimated.  ------------------------------------------------------------------- Right atrium: The atrium was normal in size.  ------------------------------------------------------------------- Pericardium: There was no pericardial  effusion.  ------------------------------------------------------------------- Systemic veins: Inferior vena cava: The vessel was normal in size.  ------------------------------------------------------------------- Measurements  Left ventricle Value Reference LV ID, ED, PLAX chordal (L) 36.9 mm 43 - 52 LV ID, ES, PLAX chordal 24.1 mm 23 - 38 LV fx shortening, PLAX chordal 35 % >=29 LV PW thickness, ED 8.8 mm --------- IVS/LV PW ratio, ED 0.91 <=1.3 Stroke volume, 2D 91 ml --------- Stroke volume/bsa, 2D 62 ml/m^2 --------- LV ejection fraction, 1-p A4C 65 % --------- LV end-diastolic volume, 2-p 48 ml --------- LV end-systolic volume, 2-p 16 ml --------- LV ejection fraction, 2-p 67 % --------- Stroke volume, 2-p 32 ml --------- LV end-diastolic volume/bsa, 2-p 32 ml/m^2 --------- LV end-systolic volume/bsa, 2-p 11 ml/m^2 --------- Stroke volume/bsa, 2-p 21.7 ml/m^2 --------- LV e&', lateral 7.33 cm/s --------- LV E/e&', lateral 10.33 --------- LV e&', medial 4.81 cm/s --------- LV E/e&', medial 15.74 --------- LV e&', average 6.07 cm/s --------- LV E/e&', average 12.47 ---------  Ventricular septum Value Reference IVS thickness, ED 8.01 mm ---------  LVOT Value Reference LVOT ID, S 20 mm  --------- LVOT area 3.14 cm^2 --------- LVOT peak velocity, S 145 cm/s --------- LVOT mean velocity, S 81.7 cm/s --------- LVOT VTI, S 29 cm --------- LVOT peak gradient, S 8 mm Hg ---------  Aorta Value Reference Aortic root ID, ED 33 mm --------- Ascending aorta ID, A-P, S 43 mm ---------  Left atrium Value Reference LA ID, A-P, ES 38 mm --------- LA ID/bsa, A-P (H) 2.57 cm/m^2 <=2.2 LA volume, S 48 ml --------- LA volume/bsa, S 32.5 ml/m^2 --------- LA volume, ES, 1-p A4C 44 ml --------- LA volume/bsa, ES, 1-p A4C 29.8 ml/m^2 --------- LA volume, ES, 1-p A2C 52 ml --------- LA volume/bsa, ES, 1-p A2C 35.2 ml/m^2 ---------  Mitral valve Value Reference Mitral E-wave peak velocity 75.7 cm/s --------- Mitral A-wave peak velocity 124 cm/s --------- Mitral deceleration time (H) 380 ms 150 - 230 Mitral peak gradient, D 2 mm Hg --------- Mitral E/A ratio, peak 0.6 ---------  Systemic veins Value Reference Estimated CVP 3 mm Hg ---------  Right ventricle Value Reference RV s&', lateral, S 13.1 cm/s ---------  Pulmonic valve Value Reference Pulmonic regurg velocity, ED 121 cm/s  --------- Pulmonic regurg gradient, ED 6 mm Hg ---------  Legend: (L) and (H) mark values outside specified reference range.  ------------------------------------------------------------------- Prepared and Electronically Authenticated by  Armanda Magic, MD 2017-04-06T15:18:50   NO Recent Cardiac Cath  Recent Lab Findings: Lab Results  Component Value Date   WBC 15.5* 10/11/2009   HGB 17.0* 10/11/2009   HCT 48.7* 10/11/2009   PLT 330 10/11/2009   GLUCOSE 144* 10/11/2009   ALT 18 10/11/2009   AST 21 10/11/2009   NA 131* 10/11/2009   K 3.3* 10/11/2009   CL 94* 10/11/2009   CREATININE 0.64 04/19/2015   BUN 21 04/19/2015   CO2 25 10/11/2009   Aortic Size Index=    5.3     /Body surface area is 1.45 meters squared. = 3.65   < 2.75 cm/m2      4% risk per year 2.75 to 4.25          8% risk per year > 4.25 cm/m2    20% risk per year     Assessment / Plan:   Patient with Turners Syndrome, enlarging ascending  aortic aneurysm with involvement of the arch  And moderate AI 4.8 to 5.3 since 2011. With her Tuners syndrome, small stature and enlarged aorta she is at increased risk of aortic dissection. I agree with Dr Dorris Fetch that surgical repair should be offered to the patient. Risks and options dicussed with th patient and he brother. She will need cardia cath prior to surgery.      I  spent 40 minutes counseling the patient face to face and 50% or more the  time was spent in counseling and coordination of care. The total time spent in the appointment was 60 minutes.  Delight Ovens MD      [address] [place] [place] [address] Office [phone]   Beeper [phone]  05/31/2015 9:56 AM

## 2015-06-03 NOTE — Discharge Instructions (Signed)
Radial Site Care °Refer to this sheet in the next few weeks. These instructions provide you with information about caring for yourself after your procedure. Your health care provider may also give you more specific instructions. Your treatment has been planned according to current medical practices, but problems sometimes occur. Call your health care provider if you have any problems or questions after your procedure. °WHAT TO EXPECT AFTER THE PROCEDURE °After your procedure, it is typical to have the following: °· Bruising at the radial site that usually fades within 1-2 weeks. °· Blood collecting in the tissue (hematoma) that may be painful to the touch. It should usually decrease in size and tenderness within 1-2 weeks. °HOME CARE INSTRUCTIONS °· Take medicines only as directed by your health care provider. °· You may shower 24-48 hours after the procedure or as directed by your health care provider. Remove the bandage (dressing) and gently wash the site with plain soap and water. Pat the area dry with a clean towel. Do not rub the site, because this may cause bleeding. °· Do not take baths, swim, or use a hot tub until your health care provider approves. °· Check your insertion site every day for redness, swelling, or drainage. °· Do not apply powder or lotion to the site. °· Do not flex or bend the affected arm for 24 hours or as directed by your health care provider. °· Do not push or pull heavy objects with the affected arm for 24 hours or as directed by your health care provider. °· Do not lift over 10 lb (4.5 kg) for 5 days after your procedure or as directed by your health care provider. °· Ask your health care provider when it is okay to: °¨ Return to work or school. °¨ Resume usual physical activities or sports. °¨ Resume sexual activity. °· Do not drive home if you are discharged the same day as the procedure. Have someone else drive you. °· You may drive 24 hours after the procedure unless otherwise  instructed by your health care provider. °· Do not operate machinery or power tools for 24 hours after the procedure. °· If your procedure was done as an outpatient procedure, which means that you went home the same day as your procedure, a responsible adult should be with you for the first 24 hours after you arrive home. °· Keep all follow-up visits as directed by your health care provider. This is important. °SEEK MEDICAL CARE IF: °· You have a fever. °· You have chills. °· You have increased bleeding from the radial site. Hold pressure on the site. °SEEK IMMEDIATE MEDICAL CARE IF: °· You have unusual pain at the radial site. °· You have redness, warmth, or swelling at the radial site. °· You have drainage (other than a small amount of blood on the dressing) from the radial site. °· The radial site is bleeding, and the bleeding does not stop after 30 minutes of holding steady pressure on the site. °· Your arm or hand becomes pale, cool, tingly, or numb. °  °This information is not intended to replace advice given to you by your health care provider. Make sure you discuss any questions you have with your health care provider. °  °Document Released: 01/28/2010 Document Revised: 01/16/2014 Document Reviewed: 07/14/2013 °Elsevier Interactive Patient Education ©2016 [employer] ° °

## 2015-06-09 ENCOUNTER — Ambulatory Visit: Payer: Medicare Other | Admitting: Thoracic Surgery (Cardiothoracic Vascular Surgery)

## 2015-06-10 ENCOUNTER — Ambulatory Visit: Payer: Medicare Other | Admitting: Thoracic Surgery (Cardiothoracic Vascular Surgery)

## 2015-06-10 ENCOUNTER — Encounter: Payer: Self-pay | Admitting: Thoracic Surgery (Cardiothoracic Vascular Surgery)

## 2015-06-10 ENCOUNTER — Ambulatory Visit (INDEPENDENT_AMBULATORY_CARE_PROVIDER_SITE_OTHER): Payer: Medicare Other | Admitting: Thoracic Surgery (Cardiothoracic Vascular Surgery)

## 2015-06-10 VITALS — BP 116/75 | HR 76 | Resp 16 | Ht <= 58 in | Wt 114.0 lb

## 2015-06-10 DIAGNOSIS — I351 Nonrheumatic aortic (valve) insufficiency: Secondary | ICD-10-CM | POA: Diagnosis not present

## 2015-06-10 DIAGNOSIS — Q969 Turner's syndrome, unspecified: Secondary | ICD-10-CM

## 2015-06-10 DIAGNOSIS — I7121 Aneurysm of the ascending aorta, without rupture: Secondary | ICD-10-CM

## 2015-06-10 DIAGNOSIS — I712 Thoracic aortic aneurysm, without rupture: Secondary | ICD-10-CM | POA: Diagnosis not present

## 2015-06-10 NOTE — Progress Notes (Signed)
[address]       [place] [id number]             [phone]       HPI: Mrs. Kristi Erickson returns today to further discuss treatment of her ascending and arch aneurysm.  05/19/2015 She is a 75 year old woman with Turner's syndrome, horseshoe kidney, glaucoma, hypertension, hyperlipidemia, osteoporosis, gastroesophageal reflux, diverticulitis, and aortic insufficiency. She had an echocardiogram in 2013 which showed moderate aortic insufficiency and preserved left ventricular function. She recently had a repeat echocardiogram. It showed mild-to-moderate aortic insufficiency, normal left ventricular function, and mild-to-moderate aortic insufficiency. The ascending aorta was noted to be dilated. 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 There was marked dilatation of the origin of the innominate artery as well. There was no evidence of dissection.  She is 4 feet 10 inches tall and weighs 114 pounds. She complains of shortness of breath with exertion after walking about a block or a flight of stairs. She says that she could get up 2 flights of stairs if she had to. She does recover within a minute or so of resting. She denies any chest pain, pressure, or tightness associated with shortness of breath. She has not had any presyncopal or syncopal spells. She is not aware of any family history of aneurysms.  After I saw her on 05/19/2015 she wanted a second opinion. She saw Dr. Tyrone Erickson who agreed that she needed surgical repair.  She had cardiac catheterization. There was a 90% stenosis in a tiny branch of the first diagonal. There was no other significant coronary disease.  Her symptoms have not changed since her last visit. She has multiple questions about surgery..  Past Medical History  Diagnosis Date  . Vision disorder   . Stomach problems   . Vitamin D deficiency   .  Turner's syndrome   . GERD (gastroesophageal reflux disease)   . Glaucoma   . Hypertension   . Hyperlipidemia   . Osteoporosis     Past Surgical History  Procedure Laterality Date  . Other surgical history  2010     ear surgery  . Abdominal surgery  1995  . Transthoracic echocardiogram  08/2011    EF=/> 55%, LA mildly dilated, midl mitral annular calcification, trace MR, trace TR, mild AV calcification w/mod regurg, trace pulm valve regurg  . Cardiac catheterization N/A 06/03/2015    Procedure: Left Heart Cath and Coronary Angiography;  Surgeon: Kristi CraftsJayadeep S Varanasi, MD;  [facility];  Service: Cardiovascular;  Laterality: N/A;     Current Outpatient Prescriptions  Medication Sig Dispense Refill  . acetaminophen (TYLENOL) 500 MG tablet Take 500 mg by mouth every morning.    Marland Kitchen. alendronate (FOSAMAX) 70 MG tablet Take 70 mg by mouth every Tuesday.     . Ascorbic Acid (VITAMIN C) 1000 MG tablet Take 3,000 mg by mouth daily.     . Cholecalciferol (VITAMIN D) 2000 units CAPS Take 2,000 Units by mouth daily.     Marland Kitchen. dicyclomine (BENTYL) 20 MG tablet Take 20 mg by mouth 2 (two) times daily.   0  . diltiazem (DILACOR XR) 180 MG 24 hr capsule Take 180 mg by mouth daily.     . dorzolamide (TRUSOPT) 2 % ophthalmic solution Place 1 drop into both eyes 2 (two) times daily.    Marland Kitchen. levothyroxine (SYNTHROID,  LEVOTHROID) 88 MCG tablet Take 88 mcg by mouth every morning.     . Multiple Vitamin (MULTIVITAMIN) tablet Take 1 tablet by mouth daily.    . quinapril (ACCUPRIL) 40 MG tablet Take 40 mg by mouth daily.     . simvastatin (ZOCOR) 40 MG tablet Take 40 mg by mouth every morning.     . temazepam (RESTORIL) 15 MG capsule Take 15 mg by mouth at bedtime.      No current facility-administered medications for this visit.   Family History  Problem Relation Age of Onset  . Heart attack Maternal Grandfather   . Heart attack Father   . Skin cancer Brother    Social History   Social  History  . Marital Status: Single    Spouse Name: N/A  . Number of Children: N/A  . Years of Education: M.A.   Occupational History  . Not on file.   Social History Main Topics  . Smoking status: Former Smoker -- 3 years  . Smokeless tobacco: Never Used  . Alcohol Use: 0.0 oz/week    0 Standard drinks or equivalent per week     Comment: 1-2 glasses of wine/week  . Drug Use: No  . Sexual Activity: Not on file   Other Topics Concern  . Not on file   Social History Narrative    Physical Exam BP 116/75 mmHg  Pulse 76  Resp 16  Ht 4\' 10"  (1.473 m)  Wt 114 lb (51.71 kg)  BMI 23.83 kg/m2  SpO55 74% 75 year old woman with small stature and webbed neck No carotid bruits Lungs clear with equal size bilaterally Cardiac regular rate and rhythm with a 2/6 diastolic murmur Abdomen soft nontender  Diagnostic Tests: CT ANGIOGRAPHY CHEST WITH CONTRAST  TECHNIQUE: Multidetector CT imaging of the chest was performed using the standard protocol during bolus administration of intravenous contrast. Multiplanar CT image reconstructions and MIPs were obtained to evaluate the vascular anatomy.  CONTRAST: OMNIPAQUE IOHEXOL 350 MG/ML SOLN  COMPARISON: Prior CT abdomen/ pelvis 01/16/2014  FINDINGS: Mediastinum: 1.8 cm heterogeneous enhancing nodule within the right thyroid gland. The remainder the thoracic inlet is unremarkable. No mediastinal mass or suspicious adenopathy. The thoracic esophagus is within normal limits.  Heart/Vascular: Significant aneurysmal dilatation of the ascending and transverse thoracic aorta. The ascending thoracic aorta measures up to 5.3 cm in diameter. The transverse aorta measures up to 5.1 cm in diameter. 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 Mild atherosclerotic plaque without evidence of significant  stenosis. No effacement of the sino-tubular junction. The aortic root is within normal limits. The heart is normal in size. Calcifications present along the course of the coronary arteries. The main pulmonary artery is normal in size. No evidence of central pulmonary embolus.  Lungs/Pleura: Trace dependent atelectasis in the lower lobes. No suspicious pulmonary nodule or mass. No edema, pleural effusion, pneumothorax or focal consolidation.  Bones/Soft Tissues: No acute fracture or aggressive appearing lytic or blastic osseous lesion.  Upper Abdomen: Visualized upper abdominal organs are unremarkable.  Review of the MIP images confirms the above findings.  IMPRESSION: VASCULAR  1. 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 Ascending thoracic aortic aneurysm. Recommend semi-annual imaging followup by CTA or MRA and referral to cardiothoracic surgery if not already obtained. This recommendation follows 2010 ACCF/AHA/AATS/ACR/ASA/SCA/SCAI/SIR/STS/SVM Guidelines for the Diagnosis and  Management of Patients With Thoracic Aortic Disease. Circulation. 2010; 121: A540-J811 2. 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 3. Fusiform aneurysmal dilatation of the aortic arch measuring up to 5.1 cm in diameter. 4. Atherosclerotic vascular calcifications including coronary artery calcifications. NON VASCULAR  1. Incidental note is made of a 1.8 cm right-sided thyroid nodule. Further evaluation with dedicated thyroid ultrasound could be performed if clinically warranted. 2. Otherwise, unremarkable CT scan of the chest.  Signed,  Kristi Big, MD  [facility]  [facility]   Electronically Signed  By: Kristi Erickson M.D.  On:  04/29/2015 16:03  Echocardiogram Study Conclusions  - Left ventricle: The cavity size was normal. Systolic function was  normal. The estimated ejection fraction was in the range of 60%  to 65%. Wall motion was normal; there were no regional wall  motion abnormalities. There was an increased relative  contribution of atrial contraction to ventricular filling.  Doppler parameters are consistent with abnormal left ventricular  relaxation (grade 1 diastolic dysfunction). - Aortic valve: Trileaflet; normal thickness, mildly calcified  leaflets. There was mild to moderate regurgitation directed  eccentrically in the LVOT and towards the mitral anterior  leaflet. - Aorta: Ascending aortic diameter: 51 mm (S). - Ascending aorta: The ascending aorta was severely dilated.  Impressions:  - Normal LVF with grade 1 diastolic dysfunction. AV leaflets are  mildly calcified. No AS. There is mild to moderate AI  eccentrically directed towards the anterior mitral valve leaflet.  The ascending aorta is dilated with measurements ranging from 48  to 51mm in diameter. Recommend Chest CT to get more accurate  assessment.  Cardiac catheterization Conclusion     Small branch of 1st Diag lesion, 90% stenosed. No other significant CAD.  Mid LAD lesion, 25% stenosed.  The left ventricular systolic function is normal.  Large, ascending/arch aneurysm. Grade 3 aortic insufficency.  Continue with f/u from CT surgery for ascending aortic aneurysm treatment.     I personally reviewed her echocardiogram, cardiac catheterization and CT angiogram and concur with the findings as noted above.   Impression: 75 year old woman with a large ascending and arch aneurysm involving the takeoff of the innominate artery and ending between the left common and left subclavian arteries. This measures 5.3 cm. In a lady of the stature that is a massively enlarged aneurysm. She is high risk for dissection  or rupture if untreated. I recommended again that we proceed with repair of the aneurysm. We also discussed management aortic valve. She does have some aortic insufficiency. I suspect this is secondary to the aneurysm and that grafting the aneurysm well fixed aortic insufficiency. They understand that aortic valve repair or replacement could be necessary depending on intraoperative findings. If an aortic valve replacement is necessary a tissue valve would be used.  We again discussed the indications, risks, benefits, and alternatives. She understands the risks include but are not limited to death, MI, DVT, PE, bleeding, need for transfusion, infection(common or atypical), stroke or other neurologic injury, renal failure, respiratory failure, gastrointestinal complications, as well as the possibility of other unforeseeable complications.  She accepts the risk and wishes to proceed.  She has concerns about her living arrangements postoperatively. We will have a case manager discuss possible assisted living with her. She her brother are aware that this plans could change depending on her hospital course.  Plan:  Repair of ascending and arch aneurysm with coronary pulmonary bypass and deep hypothermic circulatory arrest on Monday, 06/28/2015.  Kristi Slot, MD [facility] [phone]

## 2015-06-11 ENCOUNTER — Other Ambulatory Visit: Payer: Self-pay | Admitting: *Deleted

## 2015-06-11 DIAGNOSIS — I7121 Aneurysm of the ascending aorta, without rupture: Secondary | ICD-10-CM

## 2015-06-11 DIAGNOSIS — I712 Thoracic aortic aneurysm, without rupture: Secondary | ICD-10-CM

## 2015-06-24 ENCOUNTER — Ambulatory Visit (HOSPITAL_COMMUNITY)
Admission: RE | Admit: 2015-06-24 | Discharge: 2015-06-24 | Disposition: A | Discharge status: Home / Self Care | Payer: Medicare Other | Source: Ambulatory Visit | Attending: Thoracic Surgery (Cardiothoracic Vascular Surgery) | Admitting: Thoracic Surgery (Cardiothoracic Vascular Surgery)

## 2015-06-24 ENCOUNTER — Ambulatory Visit (HOSPITAL_BASED_OUTPATIENT_CLINIC_OR_DEPARTMENT_OTHER)
Admission: RE | Admit: 2015-06-24 | Discharge: 2015-06-24 | Disposition: A | Discharge status: Home / Self Care | Payer: Medicare Other | Source: Ambulatory Visit | Attending: Thoracic Surgery (Cardiothoracic Vascular Surgery) | Admitting: Thoracic Surgery (Cardiothoracic Vascular Surgery)

## 2015-06-24 ENCOUNTER — Encounter (HOSPITAL_COMMUNITY): Payer: Self-pay

## 2015-06-24 ENCOUNTER — Encounter (HOSPITAL_COMMUNITY)
Admission: RE | Admit: 2015-06-24 | Discharge: 2015-06-24 | Disposition: A | Discharge status: Home / Self Care | Payer: Medicare Other | Source: Ambulatory Visit | Attending: Thoracic Surgery (Cardiothoracic Vascular Surgery) | Admitting: Thoracic Surgery (Cardiothoracic Vascular Surgery)

## 2015-06-24 VITALS — BP 136/71 | HR 55 | Temp 97.1°F | Resp 18 | Ht <= 58 in | Wt 113.0 lb

## 2015-06-24 DIAGNOSIS — I712 Thoracic aortic aneurysm, without rupture: Secondary | ICD-10-CM | POA: Insufficient documentation

## 2015-06-24 DIAGNOSIS — R001 Bradycardia, unspecified: Secondary | ICD-10-CM | POA: Insufficient documentation

## 2015-06-24 DIAGNOSIS — Z01818 Encounter for other preprocedural examination: Secondary | ICD-10-CM | POA: Insufficient documentation

## 2015-06-24 DIAGNOSIS — I7121 Aneurysm of the ascending aorta, without rupture: Secondary | ICD-10-CM

## 2015-06-24 DIAGNOSIS — Z01812 Encounter for preprocedural laboratory examination: Secondary | ICD-10-CM | POA: Diagnosis not present

## 2015-06-24 DIAGNOSIS — Z0183 Encounter for blood typing: Secondary | ICD-10-CM | POA: Insufficient documentation

## 2015-06-24 DIAGNOSIS — I444 Left anterior fascicular block: Secondary | ICD-10-CM | POA: Insufficient documentation

## 2015-06-24 HISTORY — DX: Cardiac murmur, unspecified: R01.1

## 2015-06-24 HISTORY — DX: Diverticulitis of intestine, part unspecified, without perforation or abscess without bleeding: K57.92

## 2015-06-24 HISTORY — DX: Anxiety disorder, unspecified: F41.9

## 2015-06-24 HISTORY — DX: Hypothyroidism, unspecified: E03.9

## 2015-06-24 HISTORY — DX: Personal history of other diseases of the digestive system: Z87.19

## 2015-06-24 LAB — SURGICAL PCR SCREEN
MRSA, PCR: NEGATIVE
STAPHYLOCOCCUS AUREUS: NEGATIVE

## 2015-06-24 LAB — PULMONARY FUNCTION TEST
DL/VA % PRED: 99 %
DL/VA: 3.68 ml/min/mmHg/L
DLCO UNC: 13.15 ml/min/mmHg
DLCO unc % pred: 88 %
FEF 25-75 POST: 2.5 L/s
FEF 25-75 Pre: 2.7 L/sec
FEF2575-%Change-Post: -7 %
FEF2575-%PRED-POST: 186 %
FEF2575-%PRED-PRE: 201 %
FEV1-%Change-Post: 0 %
FEV1-%Pred-Post: 130 %
FEV1-%Pred-Pre: 131 %
FEV1-Post: 1.99 L
FEV1-Pre: 2 L
FEV1FVC-%CHANGE-POST: -6 %
FEV1FVC-%PRED-PRE: 129 %
FEV6-%CHANGE-POST: 5 %
FEV6-%PRED-PRE: 106 %
FEV6-%Pred-Post: 111 %
FEV6-Post: 2.17 L
FEV6-Pre: 2.06 L
FEV6FVC-%PRED-POST: 105 %
FEV6FVC-%Pred-Pre: 105 %
FVC-%Change-Post: 6 %
FVC-%PRED-POST: 106 %
FVC-%PRED-PRE: 100 %
FVC-POST: 2.19 L
FVC-PRE: 2.06 L
POST FEV6/FVC RATIO: 100 %
PRE FEV1/FVC RATIO: 97 %
Post FEV1/FVC ratio: 91 %
Pre FEV6/FVC Ratio: 100 %
RV % pred: 82 %
RV: 1.58 L
TLC % PRED: 107 %
TLC: 4.31 L

## 2015-06-24 LAB — BLOOD GAS, ARTERIAL
ACID-BASE EXCESS: 0.4 mmol/L (ref 0.0–2.0)
Bicarbonate: 24.3 mEq/L — ABNORMAL HIGH (ref 20.0–24.0)
DRAWN BY: 206361
FIO2: 0.21
O2 Saturation: 96.2 %
PATIENT TEMPERATURE: 98.6
TCO2: 25.4 mmol/L (ref 0–100)
pCO2 arterial: 37.5 mmHg (ref 35.0–45.0)
pH, Arterial: 7.427 (ref 7.350–7.450)
pO2, Arterial: 85.9 mmHg (ref 80.0–100.0)

## 2015-06-24 LAB — COMPREHENSIVE METABOLIC PANEL
ALBUMIN: 4.2 g/dL (ref 3.5–5.0)
ALT: 18 U/L (ref 14–54)
AST: 25 U/L (ref 15–41)
Alkaline Phosphatase: 66 U/L (ref 38–126)
Anion gap: 10 (ref 5–15)
BUN: 16 mg/dL (ref 6–20)
CHLORIDE: 102 mmol/L (ref 101–111)
CO2: 21 mmol/L — AB (ref 22–32)
Calcium: 10.1 mg/dL (ref 8.9–10.3)
Creatinine, Ser: 0.56 mg/dL (ref 0.44–1.00)
GFR calc Af Amer: >60 mL/min (ref >60)
GFR calc non Af Amer: >60 mL/min (ref >60)
GLUCOSE: 99 mg/dL (ref 65–99)
POTASSIUM: 4.5 mmol/L (ref 3.5–5.1)
Sodium: 133 mmol/L — ABNORMAL LOW (ref 135–145)
Total Bilirubin: 0.7 mg/dL (ref 0.3–1.2)
Total Protein: 6.5 g/dL (ref 6.5–8.1)

## 2015-06-24 LAB — URINALYSIS, ROUTINE W REFLEX MICROSCOPIC
BILIRUBIN URINE: NEGATIVE
Glucose, UA: NEGATIVE mg/dL
Hgb urine dipstick: NEGATIVE
KETONES UR: NEGATIVE mg/dL
Leukocytes, UA: NEGATIVE
NITRITE: NEGATIVE
Protein, ur: NEGATIVE mg/dL
Specific Gravity, Urine: 1.011 (ref 1.005–1.030)
pH: 7 (ref 5.0–8.0)

## 2015-06-24 LAB — CBC
HEMATOCRIT: 45.2 % (ref 36.0–46.0)
Hemoglobin: 15.6 g/dL — ABNORMAL HIGH (ref 12.0–15.0)
MCH: 31.1 pg (ref 26.0–34.0)
MCHC: 34.5 g/dL (ref 30.0–36.0)
MCV: 90 fL (ref 78.0–100.0)
PLATELETS: 271 10*3/uL (ref 150–400)
RBC: 5.02 MIL/uL (ref 3.87–5.11)
RDW: 12.4 % (ref 11.5–15.5)
WBC: 10.1 10*3/uL (ref 4.0–10.5)

## 2015-06-24 LAB — PROTIME-INR
INR: 1.06 (ref 0.00–1.49)
Prothrombin Time: 14 seconds (ref 11.6–15.2)

## 2015-06-24 LAB — ABO/RH: ABO/RH(D): AB POS

## 2015-06-24 LAB — APTT: APTT: 30 s (ref 24–37)

## 2015-06-24 MED ORDER — ALBUTEROL SULFATE (2.5 MG/3ML) 0.083% IN NEBU
2.5000 mg | INHALATION_SOLUTION | Freq: Once | RESPIRATORY_TRACT | Status: AC
  Administered 2015-06-24: 2.5 mg via RESPIRATORY_TRACT

## 2015-06-24 NOTE — Progress Notes (Signed)
Pre-op Cardiac Surgery  Carotid Findings:  There is no obvious evidence of hemodynamically significant internal carotid artery stenosis bilaterally. Vertebral arteries are patent with antegrade flow.  Upper Extremity Right Left  Brachial Pressures 115-Triphasic 115-Triphasic  Radial Waveforms Triphasic Triphasic  Ulnar Waveforms Triphasic Triphasic  Palmar Arch (Allen's Test) Signal obliterates with radial compression, is unaffected with ulnar compression. Signal obliterates with radial compression, is unaffected with ulnar compression.   06/24/2015 11:31 AM Gertie FeyMichelle Shelsea Hangartner, RVT, RDCS, RDMS

## 2015-06-24 NOTE — Pre-Procedure Instructions (Signed)
Kristi Erickson  06/24/2015      [id number] [place] [address] [place] [id number] Phone: [phone] Fax: [phone]    Your procedure is scheduled on June 19   Monday   [facility] [phone].M.  Call this number if you have problems the morning of surgery:  [phone]   Remember:  Do not eat food or drink liquids after midnight.  Take these medicines the morning of surgery with A SIP OF WATER tylenol if needed, Diltiazem (Dilacor), Levothroid (Synthroid), Dicyclomine (Bentyl), Trusopt eye drops   Stop taking aspirin, Ibuprofen, Advil, Motrin, BC's, Goody's, Herbal medications, fish oil, Aleve   Do not wear jewelry, make-up or nail polish.  Do not wear lotions, powders, or perfumes.  You may wear deoderant.  Do not shave 48 hours prior to surgery.  Men may shave face and neck.  Do not bring valuables to the hospital.  [facility] is not responsible for any belongings or valuables.  Contacts, dentures or bridgework may not be worn into surgery.  Leave your suitcase in the car.  After surgery it may be brought to your room.  For patients admitted to the hospital, discharge time will be determined by your treatment team.  Patients discharged the day of surgery will not be allowed to drive home.   Special instructions:   - Preparing for Surgery  Before surgery, you can play an important role.  Because skin is not sterile, your skin needs to be as free of germs as possible.  You can reduce the number of germs on you skin by washing with CHG (chlorahexidine gluconate) soap before surgery.  CHG is an antiseptic cleaner which kills germs and bonds with the skin to continue killing germs even after washing.  Please DO NOT use if you have an allergy to CHG or antibacterial soaps.  If your skin becomes reddened/irritated stop using the CHG and inform your nurse when  you arrive at Short Stay.  Do not shave (including legs and underarms) for at least 48 hours prior to the first CHG shower.  You may shave your face.  Please follow these instructions carefully:   1.  Shower with CHG Soap the night before surgery and the   morning of Surgery.  2.  If you choose to wash your hair, wash your hair first as usual with your  normal shampoo.  3.  After you shampoo, rinse your hair and body thoroughly to remove the Shampoo.  4.  Use CHG as you would any other liquid soap.  You can apply chg directly   to the skin and wash gently with scrungie or a clean washcloth.  5.  Apply the CHG Soap to your body ONLY FROM THE NECK DOWN.  Do not use on open wounds or open sores.  Avoid contact with your eyes,  ears, mouth and genitals (private parts).  Wash genitals (private parts) with your normal soap.  6.  Wash thoroughly, paying special attention to the area where your surgery  will be performed.  7.  Thoroughly rinse your body with warm water from the neck down.  8.  DO NOT shower/wash with your normal soap after using and rinsing off  the CHG Soap.  9.  Pat yourself dry with a clean towel.            10.  Wear clean pajamas.  11.  Place clean sheets on your bed the night of your first shower and do not        sleep with pets.  Day of Surgery  Do not apply any lotions/deoderants the morning of surgery.  Please wear clean clothes to the hospital/surgery center.     Please read over the following fact sheets that you were given. Pain Booklet, Coughing and Deep Breathing, Blood Transfusion Information, MRSA Information and Surgical Site Infection Prevention, Incentive Spirometry

## 2015-06-25 LAB — HEMOGLOBIN A1C
HEMOGLOBIN A1C: 5.5 % (ref 4.8–5.6)
MEAN PLASMA GLUCOSE: 111 mg/dL

## 2015-06-27 ENCOUNTER — Encounter (HOSPITAL_COMMUNITY): Payer: Self-pay | Admitting: Certified Registered Nurse Anesthetist

## 2015-06-27 MED ORDER — MAGNESIUM SULFATE 50 % IJ SOLN
40.0000 meq | INTRAMUSCULAR | Status: DC
  Filled 2015-06-27: qty 10

## 2015-06-27 MED ORDER — EPINEPHRINE HCL 1 MG/ML IJ SOLN
0.0000 ug/min | INTRAVENOUS | Status: DC
  Filled 2015-06-27: qty 4

## 2015-06-27 MED ORDER — SODIUM CHLORIDE 0.9 % IV SOLN
INTRAVENOUS | Status: AC
  Administered 2015-06-28: 70 mL/h via INTRAVENOUS
  Filled 2015-06-27: qty 40

## 2015-06-27 MED ORDER — CHLORHEXIDINE GLUCONATE 0.12 % MT SOLN
15.0000 mL | Freq: Once | OROMUCOSAL | Status: DC
  Filled 2015-06-27: qty 15

## 2015-06-27 MED ORDER — METOPROLOL TARTRATE 12.5 MG HALF TABLET: 12.5000 mg | ORAL_TABLET | Freq: Once | ORAL | Status: DC

## 2015-06-27 MED ORDER — CEFUROXIME SODIUM 750 MG IJ SOLR
750.0000 mg | INTRAMUSCULAR | Status: DC
  Filled 2015-06-27: qty 750

## 2015-06-27 MED ORDER — SODIUM CHLORIDE 0.9 % IV SOLN
INTRAVENOUS | Status: AC
  Administered 2015-06-28: 1 [IU]/h via INTRAVENOUS
  Filled 2015-06-27: qty 2.5

## 2015-06-27 MED ORDER — PLASMA-LYTE 148 IV SOLN
INTRAVENOUS | Status: AC
  Administered 2015-06-28: 500 mL
  Filled 2015-06-27: qty 2.5

## 2015-06-27 MED ORDER — POTASSIUM CHLORIDE 2 MEQ/ML IV SOLN
80.0000 meq | INTRAVENOUS | Status: DC
  Filled 2015-06-27: qty 40

## 2015-06-27 MED ORDER — DEXMEDETOMIDINE HCL IN NACL 400 MCG/100ML IV SOLN
0.1000 ug/kg/h | INTRAVENOUS | Status: AC
  Administered 2015-06-28: .3 ug/kg/h via INTRAVENOUS
  Filled 2015-06-27: qty 100

## 2015-06-27 MED ORDER — SODIUM CHLORIDE 0.9 % IV SOLN
INTRAVENOUS | Status: DC
  Filled 2015-06-27: qty 30

## 2015-06-27 MED ORDER — DEXTROSE 5 % IV SOLN
1.5000 g | INTRAVENOUS | Status: AC
  Administered 2015-06-28: 1.5 g via INTRAVENOUS
  Administered 2015-06-28: .75 g via INTRAVENOUS
  Filled 2015-06-27 (×2): qty 1.5

## 2015-06-27 MED ORDER — NITROGLYCERIN IN D5W 200-5 MCG/ML-% IV SOLN
2.0000 ug/min | INTRAVENOUS | Status: DC
  Filled 2015-06-27: qty 250

## 2015-06-27 MED ORDER — VANCOMYCIN HCL IN DEXTROSE 1-5 GM/200ML-% IV SOLN
1000.0000 mg | INTRAVENOUS | Status: AC
  Administered 2015-06-28: 1000 mg via INTRAVENOUS
  Filled 2015-06-27: qty 200

## 2015-06-27 MED ORDER — DOPAMINE-DEXTROSE 3.2-5 MG/ML-% IV SOLN
0.0000 ug/kg/min | INTRAVENOUS | Status: AC
  Administered 2015-06-28: 3 ug/kg/min via INTRAVENOUS
  Filled 2015-06-27: qty 250

## 2015-06-27 MED ORDER — DEXTROSE 5 % IV SOLN
30.0000 ug/min | INTRAVENOUS | Status: AC
  Administered 2015-06-28: 15 ug/min via INTRAVENOUS
  Filled 2015-06-27: qty 2

## 2015-06-28 ENCOUNTER — Encounter (HOSPITAL_COMMUNITY)
Admission: RE | Disposition: A | Discharge status: Skilled Nursing Facility | Payer: Self-pay | Source: Ambulatory Visit | Attending: Thoracic Surgery (Cardiothoracic Vascular Surgery)

## 2015-06-28 ENCOUNTER — Inpatient Hospital Stay (HOSPITAL_COMMUNITY): Payer: Medicare Other

## 2015-06-28 ENCOUNTER — Encounter (HOSPITAL_COMMUNITY): Payer: Self-pay | Admitting: *Deleted

## 2015-06-28 ENCOUNTER — Inpatient Hospital Stay (HOSPITAL_COMMUNITY): Payer: Medicare Other | Admitting: Certified Registered Nurse Anesthetist

## 2015-06-28 ENCOUNTER — Inpatient Hospital Stay (HOSPITAL_COMMUNITY)
Admission: RE | Admit: 2015-06-28 | Discharge: 2015-07-05 | DRG: 220 | Disposition: A | Discharge status: Skilled Nursing Facility | Payer: Medicare Other | Source: Ambulatory Visit | Attending: Thoracic Surgery (Cardiothoracic Vascular Surgery) | Admitting: Thoracic Surgery (Cardiothoracic Vascular Surgery)

## 2015-06-28 DIAGNOSIS — E876 Hypokalemia: Secondary | ICD-10-CM | POA: Diagnosis present

## 2015-06-28 DIAGNOSIS — Z95828 Presence of other vascular implants and grafts: Secondary | ICD-10-CM

## 2015-06-28 DIAGNOSIS — Z7983 Long term (current) use of bisphosphonates: Secondary | ICD-10-CM

## 2015-06-28 DIAGNOSIS — I351 Nonrheumatic aortic (valve) insufficiency: Secondary | ICD-10-CM | POA: Diagnosis present

## 2015-06-28 DIAGNOSIS — M810 Age-related osteoporosis without current pathological fracture: Secondary | ICD-10-CM | POA: Diagnosis not present

## 2015-06-28 DIAGNOSIS — Z79899 Other long term (current) drug therapy: Secondary | ICD-10-CM

## 2015-06-28 DIAGNOSIS — Q969 Turner's syndrome, unspecified: Secondary | ICD-10-CM | POA: Diagnosis not present

## 2015-06-28 DIAGNOSIS — E785 Hyperlipidemia, unspecified: Secondary | ICD-10-CM | POA: Diagnosis present

## 2015-06-28 DIAGNOSIS — Q631 Lobulated, fused and horseshoe kidney: Secondary | ICD-10-CM | POA: Diagnosis not present

## 2015-06-28 DIAGNOSIS — I2510 Atherosclerotic heart disease of native coronary artery without angina pectoris: Secondary | ICD-10-CM | POA: Diagnosis present

## 2015-06-28 DIAGNOSIS — D62 Acute posthemorrhagic anemia: Secondary | ICD-10-CM | POA: Diagnosis not present

## 2015-06-28 DIAGNOSIS — E871 Hypo-osmolality and hyponatremia: Secondary | ICD-10-CM | POA: Diagnosis present

## 2015-06-28 DIAGNOSIS — I712 Thoracic aortic aneurysm, without rupture: Principal | ICD-10-CM | POA: Diagnosis present

## 2015-06-28 DIAGNOSIS — I7122 Aneurysm of the aortic arch, without rupture: Secondary | ICD-10-CM

## 2015-06-28 DIAGNOSIS — E039 Hypothyroidism, unspecified: Secondary | ICD-10-CM | POA: Diagnosis present

## 2015-06-28 DIAGNOSIS — I10 Essential (primary) hypertension: Secondary | ICD-10-CM | POA: Diagnosis present

## 2015-06-28 DIAGNOSIS — K449 Diaphragmatic hernia without obstruction or gangrene: Secondary | ICD-10-CM | POA: Diagnosis present

## 2015-06-28 DIAGNOSIS — D696 Thrombocytopenia, unspecified: Secondary | ICD-10-CM | POA: Diagnosis present

## 2015-06-28 DIAGNOSIS — I728 Aneurysm of other specified arteries: Secondary | ICD-10-CM | POA: Diagnosis present

## 2015-06-28 DIAGNOSIS — E559 Vitamin D deficiency, unspecified: Secondary | ICD-10-CM | POA: Diagnosis present

## 2015-06-28 DIAGNOSIS — I4891 Unspecified atrial fibrillation: Secondary | ICD-10-CM | POA: Diagnosis not present

## 2015-06-28 DIAGNOSIS — H409 Unspecified glaucoma: Secondary | ICD-10-CM | POA: Diagnosis present

## 2015-06-28 DIAGNOSIS — J9811 Atelectasis: Secondary | ICD-10-CM | POA: Diagnosis not present

## 2015-06-28 DIAGNOSIS — K219 Gastro-esophageal reflux disease without esophagitis: Secondary | ICD-10-CM | POA: Diagnosis present

## 2015-06-28 DIAGNOSIS — Z87891 Personal history of nicotine dependence: Secondary | ICD-10-CM

## 2015-06-28 DIAGNOSIS — Z8249 Family history of ischemic heart disease and other diseases of the circulatory system: Secondary | ICD-10-CM

## 2015-06-28 DIAGNOSIS — I7121 Aneurysm of the ascending aorta, without rupture: Secondary | ICD-10-CM

## 2015-06-28 DIAGNOSIS — D689 Coagulation defect, unspecified: Secondary | ICD-10-CM | POA: Diagnosis present

## 2015-06-28 DIAGNOSIS — J90 Pleural effusion, not elsewhere classified: Secondary | ICD-10-CM

## 2015-06-28 HISTORY — PX: REPLACEMENT ASCENDING AORTA: SHX6068

## 2015-06-28 HISTORY — PX: TEE WITHOUT CARDIOVERSION: SHX5443

## 2015-06-28 LAB — POCT I-STAT 3, ART BLOOD GAS (G3+)
ACID-BASE DEFICIT: 1 mmol/L (ref 0.0–2.0)
ACID-BASE DEFICIT: 4 mmol/L — AB (ref 0.0–2.0)
ACID-BASE DEFICIT: 3 mmol/L — AB (ref 0.0–2.0)
ACID-BASE EXCESS: 2 mmol/L (ref 0.0–2.0)
Acid-Base Excess: 4 mmol/L — ABNORMAL HIGH (ref 0.0–2.0)
Acid-base deficit: 6 mmol/L — ABNORMAL HIGH (ref 0.0–2.0)
Acid-base deficit: 1 mmol/L (ref 0.0–2.0)
BICARBONATE: 24.9 meq/L — AB (ref 20.0–24.0)
BICARBONATE: 24.6 meq/L — AB (ref 20.0–24.0)
BICARBONATE: 22.2 meq/L (ref 20.0–24.0)
Bicarbonate: 26.2 mEq/L — ABNORMAL HIGH (ref 20.0–24.0)
Bicarbonate: 19.9 mEq/L — ABNORMAL LOW (ref 20.0–24.0)
Bicarbonate: 24.4 mEq/L — ABNORMAL HIGH (ref 20.0–24.0)
Bicarbonate: 22.8 mEq/L (ref 20.0–24.0)
Bicarbonate: 21.1 mEq/L (ref 20.0–24.0)
Bicarbonate: 21.4 mEq/L (ref 20.0–24.0)
O2 SAT: 100 %
O2 SAT: 100 %
O2 SAT: 92 %
O2 SAT: 98 %
O2 SAT: 95 %
O2 SAT: 98 %
O2 Saturation: 100 %
O2 Saturation: 100 %
O2 Saturation: 100 %
PCO2 ART: 41.5 mmHg (ref 35.0–45.0)
PCO2 ART: 31.1 mmHg — AB (ref 35.0–45.0)
PCO2 ART: 41.3 mmHg (ref 35.0–45.0)
PCO2 ART: 36.6 mmHg (ref 35.0–45.0)
PCO2 ART: 31 mmHg — AB (ref 35.0–45.0)
PCO2 ART: 34.5 mmHg — AB (ref 35.0–45.0)
PCO2 ART: 34 mmHg — AB (ref 35.0–45.0)
PH ART: 7.386 (ref 7.350–7.450)
PH ART: 7.291 — AB (ref 7.350–7.450)
PH ART: 7.432 (ref 7.350–7.450)
PO2 ART: 542 mmHg — AB (ref 80.0–100.0)
PO2 ART: 281 mmHg — AB (ref 80.0–100.0)
PO2 ART: 104 mmHg — AB (ref 80.0–100.0)
Patient temperature: 34.6
Patient temperature: 36.4
Patient temperature: 36.5
TCO2: 26 mmol/L (ref 0–100)
TCO2: 26 mmol/L (ref 0–100)
TCO2: 27 mmol/L (ref 0–100)
TCO2: 21 mmol/L (ref 0–100)
TCO2: 25 mmol/L (ref 0–100)
TCO2: 24 mmol/L (ref 0–100)
TCO2: 23 mmol/L (ref 0–100)
TCO2: 22 mmol/L (ref 0–100)
TCO2: 22 mmol/L (ref 0–100)
pCO2 arterial: 29.8 mmHg — ABNORMAL LOW (ref 35.0–45.0)
pCO2 arterial: 29.8 mmHg — ABNORMAL LOW (ref 35.0–45.0)
pH, Arterial: 7.507 — ABNORMAL HIGH (ref 7.350–7.450)
pH, Arterial: 7.552 — ABNORMAL HIGH (ref 7.350–7.450)
pH, Arterial: 7.464 — ABNORMAL HIGH (ref 7.350–7.450)
pH, Arterial: 7.477 — ABNORMAL HIGH (ref 7.350–7.450)
pH, Arterial: 7.391 (ref 7.350–7.450)
pH, Arterial: 7.405 (ref 7.350–7.450)
pO2, Arterial: 510 mmHg — ABNORMAL HIGH (ref 80.0–100.0)
pO2, Arterial: 585 mmHg — ABNORMAL HIGH (ref 80.0–100.0)
pO2, Arterial: 533 mmHg — ABNORMAL HIGH (ref 80.0–100.0)
pO2, Arterial: 51 mmHg — ABNORMAL LOW (ref 80.0–100.0)
pO2, Arterial: 90 mmHg (ref 80.0–100.0)
pO2, Arterial: 72 mmHg — ABNORMAL LOW (ref 80.0–100.0)

## 2015-06-28 LAB — POCT I-STAT, CHEM 8
BUN: 18 mg/dL (ref 6–20)
BUN: 11 mg/dL (ref 6–20)
BUN: 14 mg/dL (ref 6–20)
BUN: 10 mg/dL (ref 6–20)
BUN: 8 mg/dL (ref 6–20)
BUN: 9 mg/dL (ref 6–20)
BUN: 7 mg/dL (ref 6–20)
CALCIUM ION: 0.85 mmol/L — AB (ref 1.13–1.30)
CALCIUM ION: 1.17 mmol/L (ref 1.13–1.30)
CALCIUM ION: 0.75 mmol/L — AB (ref 1.13–1.30)
CALCIUM ION: 1.58 mmol/L — AB (ref 1.13–1.30)
CALCIUM ION: 1.01 mmol/L — AB (ref 1.13–1.30)
CHLORIDE: 100 mmol/L — AB (ref 101–111)
CHLORIDE: 101 mmol/L (ref 101–111)
CHLORIDE: 96 mmol/L — AB (ref 101–111)
CHLORIDE: 100 mmol/L — AB (ref 101–111)
CREATININE: 0.3 mg/dL — AB (ref 0.44–1.00)
Calcium, Ion: 1.17 mmol/L (ref 1.13–1.30)
Calcium, Ion: 0.61 mmol/L — CL (ref 1.13–1.30)
Chloride: 98 mmol/L — ABNORMAL LOW (ref 101–111)
Chloride: 97 mmol/L — ABNORMAL LOW (ref 101–111)
Chloride: 105 mmol/L (ref 101–111)
Creatinine, Ser: 0.3 mg/dL — ABNORMAL LOW (ref 0.44–1.00)
Creatinine, Ser: <0.2 mg/dL — ABNORMAL LOW (ref 0.44–1.00)
Creatinine, Ser: <0.2 mg/dL — ABNORMAL LOW (ref 0.44–1.00)
Creatinine, Ser: <0.2 mg/dL — ABNORMAL LOW (ref 0.44–1.00)
GLUCOSE: 140 mg/dL — AB (ref 65–99)
GLUCOSE: 79 mg/dL (ref 65–99)
GLUCOSE: 94 mg/dL (ref 65–99)
GLUCOSE: 110 mg/dL — AB (ref 65–99)
Glucose, Bld: 110 mg/dL — ABNORMAL HIGH (ref 65–99)
Glucose, Bld: 102 mg/dL — ABNORMAL HIGH (ref 65–99)
Glucose, Bld: 130 mg/dL — ABNORMAL HIGH (ref 65–99)
HCT: 25 % — ABNORMAL LOW (ref 36.0–46.0)
HCT: 35 % — ABNORMAL LOW (ref 36.0–46.0)
HCT: 22 % — ABNORMAL LOW (ref 36.0–46.0)
HCT: 32 % — ABNORMAL LOW (ref 36.0–46.0)
HEMATOCRIT: 41 % (ref 36.0–46.0)
HEMATOCRIT: 26 % — AB (ref 36.0–46.0)
HEMATOCRIT: 27 % — AB (ref 36.0–46.0)
HEMOGLOBIN: 13.9 g/dL (ref 12.0–15.0)
HEMOGLOBIN: 7.5 g/dL — AB (ref 12.0–15.0)
HEMOGLOBIN: 8.8 g/dL — AB (ref 12.0–15.0)
HEMOGLOBIN: 10.9 g/dL — AB (ref 12.0–15.0)
Hemoglobin: 11.9 g/dL — ABNORMAL LOW (ref 12.0–15.0)
Hemoglobin: 8.5 g/dL — ABNORMAL LOW (ref 12.0–15.0)
Hemoglobin: 9.2 g/dL — ABNORMAL LOW (ref 12.0–15.0)
POTASSIUM: 3.5 mmol/L (ref 3.5–5.1)
POTASSIUM: 3.3 mmol/L — AB (ref 3.5–5.1)
POTASSIUM: 3.7 mmol/L (ref 3.5–5.1)
POTASSIUM: 4.7 mmol/L (ref 3.5–5.1)
POTASSIUM: 3.4 mmol/L — AB (ref 3.5–5.1)
Potassium: 3.3 mmol/L — ABNORMAL LOW (ref 3.5–5.1)
Potassium: 4.9 mmol/L (ref 3.5–5.1)
SODIUM: 136 mmol/L (ref 135–145)
SODIUM: 134 mmol/L — AB (ref 135–145)
SODIUM: 131 mmol/L — AB (ref 135–145)
SODIUM: 139 mmol/L (ref 135–145)
SODIUM: 140 mmol/L (ref 135–145)
Sodium: 135 mmol/L (ref 135–145)
Sodium: 137 mmol/L (ref 135–145)
TCO2: 27 mmol/L (ref 0–100)
TCO2: 26 mmol/L (ref 0–100)
TCO2: 27 mmol/L (ref 0–100)
TCO2: 22 mmol/L (ref 0–100)
TCO2: 25 mmol/L (ref 0–100)
TCO2: 26 mmol/L (ref 0–100)
TCO2: 24 mmol/L (ref 0–100)

## 2015-06-28 LAB — PROTIME-INR
INR: 1.38 (ref 0.00–1.49)
PROTHROMBIN TIME: 17.1 s — AB (ref 11.6–15.2)

## 2015-06-28 LAB — PREPARE RBC (CROSSMATCH)

## 2015-06-28 LAB — CBC
HCT: 41.5 % (ref 36.0–46.0)
HCT: 34.7 % — ABNORMAL LOW (ref 36.0–46.0)
Hemoglobin: 14.2 g/dL (ref 12.0–15.0)
Hemoglobin: 11.9 g/dL — ABNORMAL LOW (ref 12.0–15.0)
MCH: 30.3 pg (ref 26.0–34.0)
MCH: 30.2 pg (ref 26.0–34.0)
MCHC: 34.2 g/dL (ref 30.0–36.0)
MCHC: 34.3 g/dL (ref 30.0–36.0)
MCV: 88.7 fL (ref 78.0–100.0)
MCV: 88.1 fL (ref 78.0–100.0)
PLATELETS: 184 10*3/uL (ref 150–400)
Platelets: 189 10*3/uL (ref 150–400)
RBC: 4.68 MIL/uL (ref 3.87–5.11)
RBC: 3.94 MIL/uL (ref 3.87–5.11)
RDW: 12.6 % (ref 11.5–15.5)
RDW: 12.5 % (ref 11.5–15.5)
WBC: 20.7 10*3/uL — AB (ref 4.0–10.5)
WBC: 20 10*3/uL — AB (ref 4.0–10.5)

## 2015-06-28 LAB — POCT I-STAT 4, (NA,K, GLUC, HGB,HCT)
GLUCOSE: 144 mg/dL — AB (ref 65–99)
Glucose, Bld: 203 mg/dL — ABNORMAL HIGH (ref 65–99)
HCT: 41 % (ref 36.0–46.0)
HEMATOCRIT: 34 % — AB (ref 36.0–46.0)
HEMOGLOBIN: 11.6 g/dL — AB (ref 12.0–15.0)
Hemoglobin: 13.9 g/dL (ref 12.0–15.0)
POTASSIUM: 3.4 mmol/L — AB (ref 3.5–5.1)
Potassium: 2.9 mmol/L — ABNORMAL LOW (ref 3.5–5.1)
SODIUM: 142 mmol/L (ref 135–145)
Sodium: 142 mmol/L (ref 135–145)

## 2015-06-28 LAB — PLATELET COUNT: Platelets: 67 10*3/uL — ABNORMAL LOW (ref 150–400)

## 2015-06-28 LAB — MAGNESIUM: MAGNESIUM: 3 mg/dL — AB (ref 1.7–2.4)

## 2015-06-28 LAB — CREATININE, SERUM: Creatinine, Ser: 0.44 mg/dL (ref 0.44–1.00)

## 2015-06-28 LAB — APTT: APTT: 30 s (ref 24–37)

## 2015-06-28 LAB — HEMOGLOBIN AND HEMATOCRIT, BLOOD
HCT: 25.1 % — ABNORMAL LOW (ref 36.0–46.0)
Hemoglobin: 8.7 g/dL — ABNORMAL LOW (ref 12.0–15.0)

## 2015-06-28 LAB — FIBRINOGEN: FIBRINOGEN: 73 mg/dL — AB (ref 204–475)

## 2015-06-28 SURGERY — REPLACEMENT, AORTA, ASCENDING
Anesthesia: General

## 2015-06-28 MED ORDER — MORPHINE SULFATE (PF) 2 MG/ML IV SOLN
2.0000 mg | INTRAVENOUS | Status: DC | PRN
  Administered 2015-06-28 – 2015-07-02 (×4): 2 mg via INTRAVENOUS
  Filled 2015-06-28 (×5): qty 1

## 2015-06-28 MED ORDER — PROTAMINE SULFATE 10 MG/ML IV SOLN
INTRAVENOUS | Status: DC | PRN
  Administered 2015-06-28: 10 mg via INTRAVENOUS
  Administered 2015-06-28: 110 mg via INTRAVENOUS

## 2015-06-28 MED ORDER — ROCURONIUM BROMIDE 100 MG/10ML IV SOLN
INTRAVENOUS | Status: DC | PRN
  Administered 2015-06-28: 50 mg via INTRAVENOUS

## 2015-06-28 MED ORDER — LACTATED RINGERS IV SOLN
INTRAVENOUS | Status: DC | PRN
  Administered 2015-06-28: 07:00:00 via INTRAVENOUS

## 2015-06-28 MED ORDER — BISACODYL 10 MG RE SUPP: 10.0000 mg | Freq: Every day | RECTAL | Status: DC

## 2015-06-28 MED ORDER — METOPROLOL TARTRATE 5 MG/5ML IV SOLN
2.5000 mg | INTRAVENOUS | Status: DC | PRN
  Administered 2015-06-30: 2.5 mg via INTRAVENOUS
  Filled 2015-06-28: qty 5

## 2015-06-28 MED ORDER — AMIODARONE HCL IN DEXTROSE 360-4.14 MG/200ML-% IV SOLN
INTRAVENOUS | Status: DC | PRN
  Administered 2015-06-28: 60 mg/h via INTRAVENOUS

## 2015-06-28 MED ORDER — SODIUM CHLORIDE 0.9% FLUSH
3.0000 mL | Freq: Two times a day (BID) | INTRAVENOUS | Status: DC
  Administered 2015-06-29 – 2015-07-02 (×5): 3 mL via INTRAVENOUS

## 2015-06-28 MED ORDER — SODIUM CHLORIDE 0.9 % IV SOLN: 250.0000 mL | INTRAVENOUS | Status: DC

## 2015-06-28 MED ORDER — ACETAMINOPHEN 160 MG/5ML PO SOLN: 1000.0000 mg | Freq: Four times a day (QID) | ORAL | Status: AC

## 2015-06-28 MED ORDER — ALBUMIN HUMAN 5 % IV SOLN
INTRAVENOUS | Status: DC | PRN
  Administered 2015-06-28 (×2): via INTRAVENOUS

## 2015-06-28 MED ORDER — PROPOFOL 10 MG/ML IV BOLUS
INTRAVENOUS | Status: AC
  Filled 2015-06-28: qty 20

## 2015-06-28 MED ORDER — PANTOPRAZOLE SODIUM 40 MG PO TBEC
40.0000 mg | DELAYED_RELEASE_TABLET | Freq: Every day | ORAL | Status: DC
  Administered 2015-06-30 – 2015-07-05 (×6): 40 mg via ORAL
  Filled 2015-06-28 (×6): qty 1

## 2015-06-28 MED ORDER — INSULIN REGULAR BOLUS VIA INFUSION
0.0000 [IU] | Freq: Three times a day (TID) | INTRAVENOUS | Status: DC
  Filled 2015-06-28: qty 10

## 2015-06-28 MED ORDER — SODIUM CHLORIDE 0.9 % IJ SOLN
OROMUCOSAL | Status: DC | PRN
  Administered 2015-06-28 (×3): 1 mL via TOPICAL

## 2015-06-28 MED ORDER — ALBUMIN HUMAN 5 % IV SOLN
12.5000 g | Freq: Once | INTRAVENOUS | Status: AC
Start: 2015-06-28 — End: 2015-06-28
  Administered 2015-06-28: 12.5 g via INTRAVENOUS

## 2015-06-28 MED ORDER — SODIUM CHLORIDE 0.9 % IV SOLN
INTRAVENOUS | Status: DC
  Administered 2015-06-28: 16:00:00 via INTRAVENOUS

## 2015-06-28 MED ORDER — DOCUSATE SODIUM 100 MG PO CAPS
200.0000 mg | ORAL_CAPSULE | Freq: Every day | ORAL | Status: DC
  Administered 2015-06-29 – 2015-07-01 (×3): 200 mg via ORAL
  Filled 2015-06-28 (×3): qty 2

## 2015-06-28 MED ORDER — LIDOCAINE HCL (CARDIAC) 20 MG/ML IV SOLN
INTRAVENOUS | Status: DC | PRN
  Administered 2015-06-28: 100 mg via INTRAVENOUS

## 2015-06-28 MED ORDER — TRAMADOL HCL 50 MG PO TABS
50.0000 mg | ORAL_TABLET | ORAL | Status: DC | PRN
  Administered 2015-06-29 (×2): 50 mg via ORAL
  Filled 2015-06-28 (×2): qty 1

## 2015-06-28 MED ORDER — ANTISEPTIC ORAL RINSE SOLUTION (CORINZ)
7.0000 mL | OROMUCOSAL | Status: DC
  Administered 2015-06-28 – 2015-06-29 (×6): 7 mL via OROMUCOSAL

## 2015-06-28 MED ORDER — CHLORHEXIDINE GLUCONATE 0.12 % MT SOLN
15.0000 mL | OROMUCOSAL | Status: AC
  Administered 2015-06-28: 15 mL via OROMUCOSAL

## 2015-06-28 MED ORDER — METOPROLOL TARTRATE 25 MG/10 ML ORAL SUSPENSION: 12.5000 mg | Freq: Two times a day (BID) | ORAL | Status: DC

## 2015-06-28 MED ORDER — MORPHINE SULFATE (PF) 2 MG/ML IV SOLN: 1.0000 mg | INTRAVENOUS | Status: AC | PRN

## 2015-06-28 MED ORDER — CALCIUM CHLORIDE 10 % IV SOLN
INTRAVENOUS | Status: DC | PRN
  Administered 2015-06-28: 200 mg via INTRAVENOUS
  Administered 2015-06-28: 800 mg via INTRAVENOUS
  Administered 2015-06-28: 300 mg via INTRAVENOUS
  Administered 2015-06-28 (×2): 200 mg via INTRAVENOUS
  Administered 2015-06-28: 300 mg via INTRAVENOUS

## 2015-06-28 MED ORDER — VECURONIUM BROMIDE 10 MG IV SOLR
INTRAVENOUS | Status: DC | PRN
  Administered 2015-06-28 (×6): 5 mg via INTRAVENOUS

## 2015-06-28 MED ORDER — DOPAMINE-DEXTROSE 3.2-5 MG/ML-% IV SOLN: 0.0000 ug/kg/min | INTRAVENOUS | Status: DC

## 2015-06-28 MED ORDER — MIDAZOLAM HCL 5 MG/5ML IJ SOLN
INTRAMUSCULAR | Status: DC | PRN
  Administered 2015-06-28: 1 mg via INTRAVENOUS
  Administered 2015-06-28 (×3): 2 mg via INTRAVENOUS
  Administered 2015-06-28: 1 mg via INTRAVENOUS

## 2015-06-28 MED ORDER — DORZOLAMIDE HCL 2 % OP SOLN
1.0000 [drp] | Freq: Two times a day (BID) | OPHTHALMIC | Status: DC
  Administered 2015-06-28 – 2015-07-05 (×14): 1 [drp] via OPHTHALMIC
  Filled 2015-06-28: qty 10

## 2015-06-28 MED ORDER — SODIUM CHLORIDE 0.45 % IV SOLN
INTRAVENOUS | Status: DC | PRN
  Administered 2015-06-28: 15:00:00 via INTRAVENOUS

## 2015-06-28 MED ORDER — LACTATED RINGERS IV SOLN
INTRAVENOUS | Status: DC
  Administered 2015-06-28: 16:00:00 via INTRAVENOUS
  Administered 2015-06-30: 20 mL/h via INTRAVENOUS

## 2015-06-28 MED ORDER — HEMOSTATIC AGENTS (NO CHARGE) OPTIME
TOPICAL | Status: DC | PRN
  Administered 2015-06-28 (×2): 1 via TOPICAL

## 2015-06-28 MED ORDER — FENTANYL CITRATE (PF) 100 MCG/2ML IJ SOLN
INTRAMUSCULAR | Status: DC | PRN
  Administered 2015-06-28: 150 ug via INTRAVENOUS
  Administered 2015-06-28: 100 ug via INTRAVENOUS
  Administered 2015-06-28: 150 ug via INTRAVENOUS
  Administered 2015-06-28: 50 ug via INTRAVENOUS
  Administered 2015-06-28 (×2): 100 ug via INTRAVENOUS
  Administered 2015-06-28: 300 ug via INTRAVENOUS
  Administered 2015-06-28: 50 ug via INTRAVENOUS
  Administered 2015-06-28: 150 ug via INTRAVENOUS
  Administered 2015-06-28: 100 ug via INTRAVENOUS

## 2015-06-28 MED ORDER — VECURONIUM BROMIDE 10 MG IV SOLR: INTRAVENOUS | Status: DC | PRN

## 2015-06-28 MED ORDER — EPHEDRINE SULFATE 50 MG/ML IJ SOLN
INTRAMUSCULAR | Status: DC | PRN
  Administered 2015-06-28: 10 mg via INTRAVENOUS

## 2015-06-28 MED ORDER — SODIUM CHLORIDE 0.9% FLUSH: 3.0000 mL | INTRAVENOUS | Status: DC | PRN

## 2015-06-28 MED ORDER — LACTATED RINGERS IV SOLN
500.0000 mL | Freq: Once | INTRAVENOUS | Status: DC | PRN
Start: 2015-06-28 — End: 2015-07-02

## 2015-06-28 MED ORDER — LACTATED RINGERS IV SOLN
INTRAVENOUS | Status: DC | PRN
  Administered 2015-06-28 (×2): via INTRAVENOUS

## 2015-06-28 MED ORDER — OXYCODONE HCL 5 MG PO TABS
5.0000 mg | ORAL_TABLET | ORAL | Status: DC | PRN
Start: 2015-06-28 — End: 2015-07-05
  Administered 2015-06-29: 5 mg via ORAL
  Administered 2015-07-03 – 2015-07-05 (×2): 10 mg via ORAL
  Filled 2015-06-28 (×2): qty 2
  Filled 2015-06-28: qty 1

## 2015-06-28 MED ORDER — ASPIRIN 81 MG PO CHEW
324.0000 mg | CHEWABLE_TABLET | Freq: Every day | ORAL | Status: DC
Start: 2015-06-29 — End: 2015-07-05
  Filled 2015-06-28: qty 4

## 2015-06-28 MED ORDER — ASPIRIN EC 325 MG PO TBEC
325.0000 mg | DELAYED_RELEASE_TABLET | Freq: Every day | ORAL | Status: DC
  Administered 2015-06-29 – 2015-07-05 (×7): 325 mg via ORAL
  Filled 2015-06-28 (×7): qty 1

## 2015-06-28 MED ORDER — PROPOFOL 10 MG/ML IV BOLUS
INTRAVENOUS | Status: AC
  Filled 2015-06-28: qty 40

## 2015-06-28 MED ORDER — POTASSIUM CHLORIDE 10 MEQ/50ML IV SOLN
10.0000 meq | INTRAVENOUS | Status: AC | PRN
  Administered 2015-06-28 (×3): 10 meq via INTRAVENOUS

## 2015-06-28 MED ORDER — ACETAMINOPHEN 160 MG/5ML PO SOLN: 650.0000 mg | Freq: Once | ORAL | Status: AC

## 2015-06-28 MED ORDER — POTASSIUM CHLORIDE 10 MEQ/50ML IV SOLN
10.0000 meq | INTRAVENOUS | Status: AC | PRN
  Administered 2015-06-28 – 2015-06-29 (×3): 10 meq via INTRAVENOUS

## 2015-06-28 MED ORDER — HEPARIN SODIUM (PORCINE) 1000 UNIT/ML IJ SOLN
INTRAMUSCULAR | Status: DC | PRN
  Administered 2015-06-28: 2000 [IU] via INTRAVENOUS
  Administered 2015-06-28: 10000 [IU] via INTRAVENOUS

## 2015-06-28 MED ORDER — DEXTROSE 5 % IV SOLN
1.5000 g | Freq: Two times a day (BID) | INTRAVENOUS | Status: AC
  Administered 2015-06-29 – 2015-06-30 (×4): 1.5 g via INTRAVENOUS
  Filled 2015-06-28 (×4): qty 1.5

## 2015-06-28 MED ORDER — DEXMEDETOMIDINE HCL IN NACL 200 MCG/50ML IV SOLN
0.0000 ug/kg/h | INTRAVENOUS | Status: DC
  Filled 2015-06-28: qty 50

## 2015-06-28 MED ORDER — POTASSIUM CHLORIDE 10 MEQ/50ML IV SOLN
10.0000 meq | Freq: Once | INTRAVENOUS | Status: AC
  Administered 2015-06-28: 10 meq via INTRAVENOUS

## 2015-06-28 MED ORDER — FENTANYL CITRATE (PF) 250 MCG/5ML IJ SOLN
INTRAMUSCULAR | Status: AC
  Filled 2015-06-28: qty 25

## 2015-06-28 MED ORDER — NITROGLYCERIN IN D5W 200-5 MCG/ML-% IV SOLN: 0.0000 ug/min | INTRAVENOUS | Status: DC

## 2015-06-28 MED ORDER — PHENYLEPHRINE HCL 10 MG/ML IJ SOLN
INTRAMUSCULAR | Status: DC | PRN
  Administered 2015-06-28: 120 ug via INTRAVENOUS

## 2015-06-28 MED ORDER — MAGNESIUM SULFATE 4 GM/100ML IV SOLN
4.0000 g | Freq: Once | INTRAVENOUS | Status: AC
  Administered 2015-06-28: 4 g via INTRAVENOUS
  Filled 2015-06-28: qty 100

## 2015-06-28 MED ORDER — ACETAMINOPHEN 650 MG RE SUPP
650.0000 mg | Freq: Once | RECTAL | Status: AC
  Administered 2015-06-28: 650 mg via RECTAL

## 2015-06-28 MED ORDER — SODIUM CHLORIDE 0.9 % IV SOLN
INTRAVENOUS | Status: DC
  Administered 2015-06-28: 3.1 [IU]/h via INTRAVENOUS
  Filled 2015-06-28: qty 2.5

## 2015-06-28 MED ORDER — SODIUM CHLORIDE 0.9 % IR SOLN
Status: DC | PRN
  Administered 2015-06-28: 1000 mL

## 2015-06-28 MED ORDER — LACTATED RINGERS IV SOLN
INTRAVENOUS | Status: DC
  Administered 2015-06-28: 16:00:00 via INTRAVENOUS

## 2015-06-28 MED ORDER — ONDANSETRON HCL 4 MG/2ML IJ SOLN
4.0000 mg | Freq: Four times a day (QID) | INTRAMUSCULAR | Status: DC | PRN
  Administered 2015-06-28 – 2015-06-30 (×4): 4 mg via INTRAVENOUS
  Filled 2015-06-28 (×4): qty 2

## 2015-06-28 MED ORDER — POTASSIUM CHLORIDE 10 MEQ/50ML IV SOLN
10.0000 meq | INTRAVENOUS | Status: DC | PRN
  Filled 2015-06-28 (×3): qty 50

## 2015-06-28 MED ORDER — GLYCOPYRROLATE 0.2 MG/ML IJ SOLN
INTRAMUSCULAR | Status: DC | PRN
  Administered 2015-06-28: 0.3 mg via INTRAVENOUS

## 2015-06-28 MED ORDER — PHENYLEPHRINE HCL 10 MG/ML IJ SOLN
0.0000 ug/min | INTRAVENOUS | Status: DC
  Filled 2015-06-28: qty 2

## 2015-06-28 MED ORDER — HEMOSTATIC AGENTS (NO CHARGE) OPTIME
TOPICAL | Status: DC | PRN
  Administered 2015-06-28: 1 via TOPICAL

## 2015-06-28 MED ORDER — ALBUMIN HUMAN 5 % IV SOLN
250.0000 mL | INTRAVENOUS | Status: AC | PRN
  Administered 2015-06-28 (×4): 250 mL via INTRAVENOUS
  Filled 2015-06-28 (×2): qty 250

## 2015-06-28 MED ORDER — ACETAMINOPHEN 500 MG PO TABS
1000.0000 mg | ORAL_TABLET | Freq: Four times a day (QID) | ORAL | Status: AC
  Administered 2015-06-29 – 2015-07-03 (×12): 1000 mg via ORAL
  Filled 2015-06-28 (×14): qty 2

## 2015-06-28 MED ORDER — VANCOMYCIN HCL IN DEXTROSE 1-5 GM/200ML-% IV SOLN
1000.0000 mg | Freq: Once | INTRAVENOUS | Status: AC
  Administered 2015-06-28: 1000 mg via INTRAVENOUS
  Filled 2015-06-28 (×2): qty 200

## 2015-06-28 MED ORDER — PROPOFOL 10 MG/ML IV BOLUS
INTRAVENOUS | Status: DC | PRN
  Administered 2015-06-28 (×2): 100 mg via INTRAVENOUS
  Administered 2015-06-28: 150 mg via INTRAVENOUS

## 2015-06-28 MED ORDER — FAMOTIDINE IN NACL 20-0.9 MG/50ML-% IV SOLN
20.0000 mg | Freq: Two times a day (BID) | INTRAVENOUS | Status: AC
  Administered 2015-06-28 (×2): 20 mg via INTRAVENOUS
  Filled 2015-06-28: qty 50

## 2015-06-28 MED ORDER — LEVOTHYROXINE SODIUM 88 MCG PO TABS
88.0000 ug | ORAL_TABLET | Freq: Every day | ORAL | Status: DC
  Administered 2015-06-29 – 2015-07-05 (×7): 88 ug via ORAL
  Filled 2015-06-28 (×7): qty 1

## 2015-06-28 MED ORDER — POTASSIUM CHLORIDE 10 MEQ/50ML IV SOLN
10.0000 meq | INTRAVENOUS | Status: AC
  Administered 2015-06-28 (×3): 10 meq via INTRAVENOUS
  Filled 2015-06-28: qty 50

## 2015-06-28 MED ORDER — ALBUMIN HUMAN 5 % IV SOLN
INTRAVENOUS | Status: AC
  Filled 2015-06-28: qty 250

## 2015-06-28 MED ORDER — METOPROLOL TARTRATE 12.5 MG HALF TABLET
12.5000 mg | ORAL_TABLET | Freq: Two times a day (BID) | ORAL | Status: DC
  Administered 2015-06-29: 12.5 mg via ORAL
  Filled 2015-06-28 (×2): qty 1

## 2015-06-28 MED ORDER — LACTATED RINGERS IV SOLN
INTRAVENOUS | Status: DC | PRN
  Administered 2015-06-28 (×2): via INTRAVENOUS

## 2015-06-28 MED ORDER — CHLORHEXIDINE GLUCONATE 0.12% ORAL RINSE (MEDLINE KIT)
15.0000 mL | Freq: Two times a day (BID) | OROMUCOSAL | Status: DC
Start: 2015-06-28 — End: 2015-06-29
  Administered 2015-06-28 – 2015-06-29 (×2): 15 mL via OROMUCOSAL

## 2015-06-28 MED ORDER — AMIODARONE IV BOLUS ONLY 150 MG/100ML
INTRAVENOUS | Status: DC | PRN
  Administered 2015-06-28: 150 mg via INTRAVENOUS

## 2015-06-28 MED ORDER — METHYLPREDNISOLONE SODIUM SUCC 125 MG IJ SOLR
INTRAMUSCULAR | Status: DC | PRN
  Administered 2015-06-28: 125 mg via INTRAVENOUS

## 2015-06-28 MED ORDER — CHLORHEXIDINE GLUCONATE 4 % EX LIQD: 30.0000 mL | CUTANEOUS | Status: DC

## 2015-06-28 MED ORDER — MIDAZOLAM HCL 2 MG/2ML IJ SOLN: 2.0000 mg | INTRAMUSCULAR | Status: DC | PRN

## 2015-06-28 MED ORDER — FENTANYL CITRATE (PF) 250 MCG/5ML IJ SOLN
INTRAMUSCULAR | Status: AC
  Filled 2015-06-28: qty 10

## 2015-06-28 MED ORDER — AMIODARONE HCL IN DEXTROSE 360-4.14 MG/200ML-% IV SOLN
60.0000 mg/h | INTRAVENOUS | Status: DC
  Filled 2015-06-28: qty 200

## 2015-06-28 MED ORDER — AMIODARONE HCL IN DEXTROSE 360-4.14 MG/200ML-% IV SOLN
30.0000 mg/h | INTRAVENOUS | Status: DC
  Administered 2015-06-29 (×3): 30 mg/h via INTRAVENOUS
  Filled 2015-06-28 (×3): qty 200

## 2015-06-28 MED ORDER — AMIODARONE HCL IN DEXTROSE 360-4.14 MG/200ML-% IV SOLN
60.0000 mg/h | INTRAVENOUS | Status: AC
  Administered 2015-06-28: 60 mg/h via INTRAVENOUS
  Filled 2015-06-28: qty 200

## 2015-06-28 MED ORDER — MIDAZOLAM HCL 10 MG/2ML IJ SOLN
INTRAMUSCULAR | Status: AC
  Filled 2015-06-28: qty 2

## 2015-06-28 MED ORDER — BISACODYL 5 MG PO TBEC
10.0000 mg | DELAYED_RELEASE_TABLET | Freq: Every day | ORAL | Status: DC
Start: 2015-06-29 — End: 2015-07-05
  Administered 2015-06-29 – 2015-07-05 (×6): 10 mg via ORAL
  Filled 2015-06-28 (×6): qty 2

## 2015-06-28 MED FILL — Potassium Chloride Inj 2 mEq/ML: INTRAVENOUS | Qty: 40 | Status: AC

## 2015-06-28 MED FILL — Sodium Chloride IV Soln 0.9%: INTRAVENOUS | Qty: 3000 | Status: AC

## 2015-06-28 MED FILL — Mannitol IV Soln 20%: INTRAVENOUS | Qty: 500 | Status: AC

## 2015-06-28 MED FILL — Heparin Sodium (Porcine) Inj 1000 Unit/ML: INTRAMUSCULAR | Qty: 10 | Status: AC

## 2015-06-28 MED FILL — Heparin Sodium (Porcine) Inj 1000 Unit/ML: INTRAMUSCULAR | Qty: 30 | Status: AC

## 2015-06-28 MED FILL — Electrolyte-R (PH 7.4) Solution: INTRAVENOUS | Qty: 6000 | Status: AC

## 2015-06-28 MED FILL — Sodium Bicarbonate IV Soln 8.4%: INTRAVENOUS | Qty: 50 | Status: AC

## 2015-06-28 MED FILL — Electrolyte-R (PH 7.4) Solution: INTRAVENOUS | Qty: 5000 | Status: AC

## 2015-06-28 MED FILL — Magnesium Sulfate Inj 50%: INTRAMUSCULAR | Qty: 10 | Status: AC

## 2015-06-28 MED FILL — Lidocaine HCl IV Inj 20 MG/ML: INTRAVENOUS | Qty: 5 | Status: AC

## 2015-06-28 SURGICAL SUPPLY — 104 items
ADAPTER CARDIO PERF ANTE/RETRO (ADAPTER) ×3 IMPLANT
ADH SRG 12 PREFL SYR 3 SPRDR (MISCELLANEOUS)
ADPR PRFSN 84XANTGRD RTRGD (ADAPTER) ×1
APPLICATOR COTTON TIP 6IN STRL (MISCELLANEOUS) IMPLANT
BAG DECANTER FOR FLEXI CONT (MISCELLANEOUS) ×3 IMPLANT
BLADE STERNUM SYSTEM 6 (BLADE) ×3 IMPLANT
BLADE SURG 15 STRL LF DISP TIS (BLADE) ×1 IMPLANT
BLADE SURG 15 STRL SS (BLADE) ×3
CANISTER SUCTION 2500CC (MISCELLANEOUS) ×3 IMPLANT
CANNULA GUNDRY RCSP 15FR (MISCELLANEOUS) ×3 IMPLANT
CATH HEART VENT LEFT (CATHETERS) IMPLANT
CATH ROBINSON RED A/P 18FR (CATHETERS) ×6 IMPLANT
CAUTERY EYE LOW TEMP 1300F FIN (OPHTHALMIC RELATED) ×3 IMPLANT
CLIP FOGARTY SPRING 6M (CLIP) IMPLANT
CONN ST 1/4X3/8  BEN (MISCELLANEOUS) ×2
CONN ST 1/4X3/8 BEN (MISCELLANEOUS) IMPLANT
CONT SPEC 4OZ CLIKSEAL STRL BL (MISCELLANEOUS) ×8 IMPLANT
CRADLE DONUT ADULT HEAD (MISCELLANEOUS) ×3 IMPLANT
DRAIN CHANNEL 28F RND 3/8 FF (WOUND CARE) ×2 IMPLANT
DRAPE SLUSH/WARMER DISC (DRAPES) IMPLANT
DRSG AQUACEL AG ADV 3.5X10 (GAUZE/BANDAGES/DRESSINGS) ×2 IMPLANT
DRSG AQUACEL AG ADV 3.5X14 (GAUZE/BANDAGES/DRESSINGS) ×2 IMPLANT
DRSG COVADERM 4X14 (GAUZE/BANDAGES/DRESSINGS) ×3 IMPLANT
DRSG COVADERM 4X8 (GAUZE/BANDAGES/DRESSINGS) ×2 IMPLANT
ELECT REM PT RETURN 9FT ADLT (ELECTROSURGICAL) ×6
ELECTRODE REM PT RTRN 9FT ADLT (ELECTROSURGICAL) ×2 IMPLANT
FELT TEFLON 1X6 (MISCELLANEOUS) ×3 IMPLANT
GAUZE SPONGE 4X4 12PLY STRL (GAUZE/BANDAGES/DRESSINGS) ×6 IMPLANT
GLOVE BIOGEL M 6.5 STRL (GLOVE) ×6 IMPLANT
GLOVE BIOGEL M STRL SZ7.5 (GLOVE) ×2 IMPLANT
GLOVE BIOGEL PI IND STRL 6 (GLOVE) IMPLANT
GLOVE BIOGEL PI IND STRL 6.5 (GLOVE) IMPLANT
GLOVE BIOGEL PI IND STRL 7.0 (GLOVE) IMPLANT
GLOVE BIOGEL PI INDICATOR 6 (GLOVE) ×10
GLOVE BIOGEL PI INDICATOR 6.5 (GLOVE) ×4
GLOVE BIOGEL PI INDICATOR 7.0 (GLOVE) ×6
GLOVE EUDERMIC 7 POWDERFREE (GLOVE) ×9 IMPLANT
GOWN STRL REUS W/ TWL LRG LVL3 (GOWN DISPOSABLE) ×4 IMPLANT
GOWN STRL REUS W/TWL LRG LVL3 (GOWN DISPOSABLE) ×21
GRAFT 4 BRANCH 28X50 (Prosthesis & Implant Heart) ×2 IMPLANT
GRAFT HEMASHIELD 8MM (Vascular Products) IMPLANT
GRAFT VASC STRG 30X8KNIT (Vascular Products) IMPLANT
HEMOSTAT POWDER SURGIFOAM 1G (HEMOSTASIS) ×9 IMPLANT
HEMOSTAT SURGICEL 2X14 (HEMOSTASIS) IMPLANT
INSERT FOGARTY XLG (MISCELLANEOUS) IMPLANT
KIT BASIN OR (CUSTOM PROCEDURE TRAY) ×3 IMPLANT
KIT ROOM TURNOVER OR (KITS) ×3 IMPLANT
KIT SUCTION CATH 14FR (SUCTIONS) ×6 IMPLANT
LINE VENT (MISCELLANEOUS) ×2 IMPLANT
LOOP VESSEL MAXI BLUE (MISCELLANEOUS) ×2 IMPLANT
LOOP VESSEL MINI RED (MISCELLANEOUS) ×2 IMPLANT
LOOP VESSEL SUPERMAXI WHITE (MISCELLANEOUS) ×2 IMPLANT
NS IRRIG 1000ML POUR BTL (IV SOLUTION) ×12 IMPLANT
PACK OPEN HEART (CUSTOM PROCEDURE TRAY) ×3 IMPLANT
PAD ARMBOARD 7.5X6 YLW CONV (MISCELLANEOUS) ×6 IMPLANT
SET CARDIOPLEGIA MPS 5001102 (MISCELLANEOUS) ×2 IMPLANT
SET VEIN GRAFT PERF (SET/KITS/TRAYS/PACK) ×2 IMPLANT
SPONGE GAUZE 4X4 12PLY STER LF (GAUZE/BANDAGES/DRESSINGS) ×4 IMPLANT
SUT ETHIBON 2 0 V 52N 30 (SUTURE) ×6 IMPLANT
SUT ETHIBON EXCEL 2-0 V-5 (SUTURE) IMPLANT
SUT ETHIBOND 2 0 SH (SUTURE) ×3
SUT ETHIBOND 2 0 SH 36X2 (SUTURE) ×1 IMPLANT
SUT ETHIBOND 2 0 V4 (SUTURE) IMPLANT
SUT ETHIBOND 2 0V4 GREEN (SUTURE) IMPLANT
SUT ETHIBOND 4 0 RB 1 (SUTURE) IMPLANT
SUT ETHIBOND V-5 VALVE (SUTURE) IMPLANT
SUT PROLENE 3 0 SH 1 (SUTURE) ×6 IMPLANT
SUT PROLENE 3 0 SH DA (SUTURE) ×3 IMPLANT
SUT PROLENE 4 0 RB 1 (SUTURE) ×60
SUT PROLENE 4 0 SH DA (SUTURE) ×22 IMPLANT
SUT PROLENE 4-0 RB1 .5 CRCL 36 (SUTURE) IMPLANT
SUT PROLENE 5 0 C 1 36 (SUTURE) ×16 IMPLANT
SUT PROLENE 5 0 C1 (SUTURE) ×4 IMPLANT
SUT PROLENE 6 0 C 1 30 (SUTURE) ×4 IMPLANT
SUT SILK  1 MH (SUTURE) ×6
SUT SILK 1 MH (SUTURE) ×2 IMPLANT
SUT SILK 1 TIES 10X30 (SUTURE) ×3 IMPLANT
SUT SILK 2 0 (SUTURE) ×3
SUT SILK 2 0 SH CR/8 (SUTURE) ×6 IMPLANT
SUT SILK 2-0 18XBRD TIE 12 (SUTURE) ×1 IMPLANT
SUT SILK 3 0 SH CR/8 (SUTURE) ×3 IMPLANT
SUT SILK 3 0 TIES 10X30 (SUTURE) ×2 IMPLANT
SUT SILK 4 0 (SUTURE) ×3
SUT SILK 4-0 18XBRD TIE 12 (SUTURE) ×1 IMPLANT
SUT STEEL 6MS V (SUTURE) IMPLANT
SUT TEM PAC WIRE 2 0 SH (SUTURE) ×12 IMPLANT
SUT VIC AB 1 CTX 36 (SUTURE) ×9
SUT VIC AB 1 CTX36XBRD ANBCTR (SUTURE) ×2 IMPLANT
SUT VIC AB 2-0 CT1 27 (SUTURE) ×3
SUT VIC AB 2-0 CT1 TAPERPNT 27 (SUTURE) IMPLANT
SUT VIC AB 2-0 CTX 27 (SUTURE) ×6 IMPLANT
SUT VIC AB 3-0 X1 27 (SUTURE) ×8 IMPLANT
SYR 10ML KIT SKIN ADHESIVE (MISCELLANEOUS) IMPLANT
SYSTEM SAHARA CHEST DRAIN ATS (WOUND CARE) ×3 IMPLANT
TAPE CLOTH SURG 4X10 WHT LF (GAUZE/BANDAGES/DRESSINGS) ×2 IMPLANT
TOWEL OR 17X24 6PK STRL BLUE (TOWEL DISPOSABLE) ×3 IMPLANT
TOWEL OR 17X26 10 PK STRL BLUE (TOWEL DISPOSABLE) ×3 IMPLANT
TRAY FOLEY IC TEMP SENS 14FR (CATHETERS) ×3 IMPLANT
TUBE CONNECTING 12'X1/4 (SUCTIONS) ×1
TUBE CONNECTING 12X1/4 (SUCTIONS) ×1 IMPLANT
UNDERPAD 30X30 INCONTINENT (UNDERPADS AND DIAPERS) ×3 IMPLANT
VENT LEFT HEART 12002 (CATHETERS) ×3
WATER STERILE IRR 1000ML POUR (IV SOLUTION) ×6 IMPLANT
YANKAUER SUCT BULB TIP NO VENT (SUCTIONS) ×2 IMPLANT

## 2015-06-28 NOTE — Transfer of Care (Signed)
Immediate Anesthesia Transfer of Care Note  Patient: Kristi Erickson  Procedure(s) Performed: Procedure(s): REPLACEMENT ASCENDING AORTA AND ARCH (N/A) TRANSESOPHAGEAL ECHOCARDIOGRAM (TEE) (N/A)  Patient Location: SICU  Anesthesia Type:General  Level of Consciousness: Patient remains intubated per anesthesia plan  Airway & Oxygen Therapy: Patient remains intubated per anesthesia plan  Post-op Assessment: Report given to RN and Post -op Vital signs reviewed and stable  Post vital signs: Reviewed and stable  Last Vitals:  Filed Vitals:   06/28/15 0615 06/28/15 0621  BP:  147/89  Pulse: 53 61  Temp:  37.1 C  Resp:  16    Last Pain: There were no vitals filed for this visit.       Complications: No apparent anesthesia complications

## 2015-06-28 NOTE — Progress Notes (Addendum)
[facility] BRIEF SICU PROGRESS NOTE  Day of Surgery  S/P Procedure(s) (LRB): REPLACEMENT ASCENDING AORTA AND ARCH (N/A) TRANSESOPHAGEAL ECHOCARDIOGRAM (TEE) (N/A)   Waking up on vent.  Moving all 4 extrem's AV paced w/ stable hemodynamics on dopamine O2 sats 97% on 50% FiO2 Chest tube output low UOP excellent Labs okay.  Potassium low, getting replaced  Plan: Continue routine early postop   Purcell Nailslarence H Owen, MD 06/28/2015 7:27 PM

## 2015-06-28 NOTE — Anesthesia Procedure Notes (Addendum)
Procedure Name: Intubation Date/Time: 06/28/2015 7:51 AM Performed by: Dairl PonderJIANG, FUDAN Pre-anesthesia Checklist: Patient identified, Timeout performed, Emergency Drugs available, Suction available and Patient being monitored Patient Re-evaluated:Patient Re-evaluated prior to inductionOxygen Delivery Method: Circle system utilized Preoxygenation: Pre-oxygenation with 100% oxygen Intubation Type: IV induction Ventilation: Mask ventilation without difficulty Laryngoscope Size: Mac, 3 and Glidescope Grade View: Grade III Tube type: Subglottic suction tube Tube size: 8.0 mm Number of attempts: 2 Airway Equipment and Method: Stylet Placement Confirmation: ETT inserted through vocal cords under direct vision,  breath sounds checked- equal and bilateral and positive ETCO2 Secured at: 20 cm Tube secured with: Tape Dental Injury: Teeth and Oropharynx as per pre-operative assessment     Central Venous Catheter Insertion Performed by: anesthesiologist 06/28/2015 7:00 AM Patient location: Pre-op. Preanesthetic checklist: patient identified, IV checked, site marked, risks and benefits discussed, surgical consent, monitors and equipment checked, pre-op evaluation, timeout performed and anesthesia consent Lidocaine 1% used for infiltration Landmarks identified Catheter size: 8.5 Fr Sheath introducer Procedure performed using ultrasound guided technique. Attempts: 1 Following insertion, line sutured and dressing applied. Post procedure assessment: blood return through all ports, free fluid flow and no air. Patient tolerated the procedure well with no immediate complications.    Central Venous Catheter Insertion Performed by: anesthesiologist Patient location: Pre-op. Preanesthetic checklist: patient identified, IV checked, site marked, risks and benefits discussed, surgical consent, monitors and equipment checked, pre-op evaluation, timeout performed and anesthesia consent Landmarks  identified PA cath was placed.Swan type and PA catheter depth:thermodilation and 45PA Cath depth:45 Procedure performed using ultrasound guided technique. Attempts: 1 Patient tolerated the procedure well with no immediate complications.

## 2015-06-28 NOTE — Op Note (Signed)
[name], [name] NO.:  [id number]  MEDICAL RECORD NO.:  [id number]  LOCATION:  2S01C                        FACILITY:  [facility]  PHYSICIAN:  Salvatore Decent. Dorris Fetch, M.D.DATE OF BIRTH:  10-14-1940  DATE OF PROCEDURE:  06/28/2015 DATE OF DISCHARGE:                              OPERATIVE REPORT   PREOPERATIVE DIAGNOSIS:  Ascending and arch aneurysm.  POSTOPERATIVE DIAGNOSIS:  Ascending and arch aneurysm.  PROCEDURE:  Repair of ascending and arch aneurysm with side-arm graft to the innominate and left common carotid under deep hypothermic circulatory arrest, Resuspension of aortic valve .  SURGEON:  Salvatore Decent. Dorris Fetch, M.D.  FIRST ASSISTANCE:  Rowe Clack, P.A.-C.  ANESTHESIA:  General.  FINDINGS:  Large aneurysm beginning at the sinotubular junction, extending into the arch between the left common carotid and left subclavian.  Saccular aneurysm of the innominate artery.  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  The origin of the innominate artery was markedly dilated as well.  There was no evidence of a dissection.  Due to the large size of the aneurysm and her small stature, she was felt to be at high risk for dissection or rupture and was advised to undergo repair of the aneurysm.  The indications, risks, benefits, and alternatives were discussed in detail with the patient. Her brother was also present during the discussion.  She understood this was a high-risk operation which required deep hypothermic circulatory arrest and arch reconstruction.  She accepted the risks and agreed to proceed.  OPERATIVE NOTE:  Kristi Erickson was brought to the preoperative  holding area on June 28, 2015.  Anesthesia placed a Swan-Ganz catheter and an arterial blood pressure monitoring line.  She was taken to the operating room, anesthetized, and intubated.  Intravenous antibiotics were administered.  A Foley catheter was placed.  The chest, abdomen, and legs were prepped and draped in usual sterile fashion.  Arterial lines were in place in both radial arteries. Transesophageal echocardiogram was performed by Dr. Gentry Roch in consultation with Dr. Noreene Larsson. Mild-to-moderate aortic insufficiency was noted.  There was a large aneurysm.  There was preserved left ventricular function and no significant mitral regurgitation.  After performing a time-out, an incision was made in the right deltopectoral groove.  The dissection was carried down into the deltopectoral fat pad.  The axillary artery was identified.  The brachial plexus was Identified and preserved.  The axillary artery was dissected out. 2000 units of heparin was administered.  The axillary artery was clamped proximally and distally and an arteriotomy was made.  An arterial cannula with an attached 8 mm Gore-Tex graft then was sewn end-to-side to the axillary artery with a running 5-0 Prolene suture.  BioGlue was applied to the suture line.  The cannula was de-aired and connected to the bypass circuit.  A median sternotomy was performed.  Initial hemostasis was obtained.  Retractor was placed.  The remainder of the  full heparin dose was given.  The pericardium was incised. There were adhesions within the pericardium.  The dissection was carried down towards the diaphragm.  An old hematoma was entered near the xiphoid on the right side.  There did not appear to be any entry into the peritoneal space. Pericardial stay sutures were placed.  The right atrium was dissected out and cannulated via a 2-0 Ethibond pursestring suture.  Cardiopulmonary bypass was initiated.  Flows were maintained per protocol. Cooling  was begun. The aorta, pulmonary artery, right atrium, right ventricle, and part of the left ventricle were dissected out from the adhesions.  A temperature probe was placed.  A vent was placed via a pursestring suture in the right superior pulmonary vein and directed into the left ventricle.  A retrograde cardioplegia cannula was placed via a pursestring in the right atrium and directed into the coronary sinus.  An antegrade cardioplegia cannula was placed into the ascending aorta.  The innominate vein was dissected out.  The aorta was crossclamped.  The left ventricle was emptied via the left ventricular vent.  Cardiac arrest then was achieved with combination of cold antegrade and retrograde blood cardioplegia and topical iced saline.  Initial diastolic arrest was achieved with antegrade cardioplegia.  Retrograde cardioplegia was administered to decrease the myocardial septal temperature to 8 degrees Celsius. Additional doses of retrograde cardioplegia were administered at 20- minute intervals.  The aorta was transected.  The proximal aorta was inspected.  Cooling was ongoing during this time.  Carbon dioxide was being insufflated into the operative field during the entire procedure. The sino-tubular junction appeared slightly dilated, but had normal morphology as did the sinuses of Valsalva.  The coronary ostia were inspected.  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  The aortic valve was resuspended with 4-0 Prolene pledgeted sutures.  Decision was made to perform a straight graft from the sinotubular junction.  A 28 mm Hemashield graft with a side arm and 3 branches was selected, one end of the graft was cut off and anastomosed to the proximal aorta with a running 4-0 Prolene suture.  A Teflon felt strip was used on the aortic side.  By this point, the patient had cooled to 19 degrees Celsius.  Propofol and steroids  were administered.  The patient was placed in steep Trendelenburg position.  When the BIS reached 0, flow was ceased.  The aortic crossclamp was removed, and the patient was exsanguinated for deep hypothermic circulatory arrest.  The head was packed in ice by Anesthesia.  A clamp was placed on the innominate artery and antegrade cerebral perfusion was administered via the axillary graft at 500 mL/minute.  Inspection of the aorta showed a clear demarcation between the left common carotid and the left subclavian where the aneurysm ended.  The graft was cut to length with preservation of the 3 side branches as well as the side branch for arterial inflow.  The distal end then was anastomosed to the aorta between the left common carotid and left subclavian with a running 4-0 Prolene suture.  The left common carotid was inspected, the origin was aneurysmal, but just beyond this, the artery appeared normal in size.  The aneurysmal portion was removed. There was a good size match between the left carotid and the side branch of the graft.  An end-to-end anastomosis was performed with a running 5- 0 Prolene suture.  BioGlue was applied to the suture lines  on the outside.  The arterial inflow was stopped.  A clamp was placed on the Gore-Tex graft which then was cut off.  The cannula was inserted into the inflow branch of the aortic graft and secured.  Flow was then begun into the graft and a crossclamp was applied between the 2nd and 3rd side branches reestablishing perfusion to the body and the left common carotid.  Rewarming was not initiated.  The total circulatory arrest time was 36 minutes and antegrade cerebral perfusion time was 27 minutes.  There was good backbleeding from the innominate artery with perfusion to the left common carotid and systemically.  The innominate artery then was dissected out.  There was a saccular aneurysm of the innominate artery, but the innominate artery itself  beyond the saccular aneurysm appeared normal.  The second side branch was used to graft to the innominate artery.  It was cut on a slight bevel for size match, and then a 5-0 Prolene suture was used for the end-to-end anastomosis.  The third branch was clamped and later oversewn.  At this point, flow was lowered.  The crossclamp was then moved proximal to the side branches of the graft reestablishing flow to the entire upper body.  Crossclamp was reapplied and systemic rewarming was initiated.  The proximal and distal ends of the graft were cut to length, and an end-to-end anastomosis of the aortic graft was performed with a running 4-0 Prolene suture.  At the completion of this, the patient was placed in Trendelenburg position.  A warm dose of retrograde cardioplegia was administered.  The heart was filled and the lungs were inflated and de-airing was performed.  The aortic crossclamp was removed and the suture was tied.  The retrograde cardioplegia cannula was removed.  The total crossclamp time was 118 minutes.  While rewarming was completed, all anastomoses were inspected for hemostasis.  Repair sutures were placed as needed.  Epicardial pacing wires were placed on the right ventricle and right atrium.  The patient was in heart block and DDD pacing was initiated.  It was noted that the side branch to the innominate was relatively long and was at risk for kinking when the lungs were inflated, and there was concern this would kink when the chest was closed, therefore clamps were placed proximally and distally.  A 5-6 mm segment of the graft was removed and end-to-end anastomosis was performed with a 4-0 Prolene suture.  After this, there was a much better lay to the side branch.  When the patient had rewarmed to a core temperature of 37 degrees Celsius, she was weaned from cardiopulmonary bypass initially without any inotropic support.  She was DDD paced at 80 beats per minute.   Low-dose dopamine infusion at 5 mcg/kg per minute was initiated as the blood pressure was initially low and the heart was mildly sluggish.  Over time, the cardiac contractility improved, particularly in the right ventricle, and the patient had good hemodynamics thereafter.  There was residual mild aortic insufficiency, this did not warrant additional intervention.  A test dose of protamine was administered.  The atrial cannula was removed.  The left ventricular vent was removed just prior to coming off bypass completely.  The aortic inflow side branch was clamped.  The remainder of the protamine was administered without incident.  The inflow side branch was oversewn with running 4-0 Prolene suture.  The chest was irrigated with warm saline.  An initial inspection was made for hemostasis.  The sternal  wound was packed, and the retractor removed.  Attention was turned to the right axillary incision, where the graft was oversewn near its origin with a running 5- 0 Prolene suture.  Clips were also applied.  There was good hemostasis. The wound was copiously irrigated with saline and then closed in 3 layers.  A retractor was placed back in the sternum, the packing was removed.  Inspection revealed no ongoing significant bleeding.  The chest was again copiously irrigated with warm saline.  A 28-French Blake drain and 32-French chest tube were placed through separate subcostal incisions and secured with #1 silk sutures.  Care was taken not to enter the peritoneum due to the patient's previous upper abdominal surgery. The pericardium was reapproximated over the aortic graft, and the mediastinal fat was reapproximated over the innominate vein hopefully to prevent adhesions of the sternum.  The sternum was closed with a combination of single and double heavy gauge stainless steel wires.  The pectoralis fascia, subcutaneous tissue, and skin were closed in standard fashion.  All sponge, needle, and  instrument counts were correct at the end of the procedure.  There were no intraoperative complications.  The patient was taken from the operating room to the Surgical Intensive Care Unit intubated and in fair condition.     Salvatore Decent Dorris Fetch, M.D.     SCH/MEDQ  D:  06/28/2015  T:  06/28/2015  Job:  [id number]

## 2015-06-28 NOTE — Anesthesia Preprocedure Evaluation (Signed)
Anesthesia Evaluation  Patient identified by MRN, date of birth, ID band Patient awake    Reviewed: Allergy & Precautions, H&P , NPO status , Patient's Chart, lab work & pertinent test results  History of Anesthesia Complications Negative for: history of anesthetic complications  Airway Mallampati: II  TM Distance: >3 FB Neck ROM: full    Dental no notable dental hx.    Pulmonary former smoker,    Pulmonary exam normal breath sounds clear to auscultation       Cardiovascular hypertension, + Peripheral Vascular Disease  Normal cardiovascular exam Rhythm:regular Rate:Normal     Neuro/Psych Anxiety negative neurological ROS     GI/Hepatic Neg liver ROS, hiatal hernia, GERD  ,  Endo/Other  Hypothyroidism   Renal/GU Renal disease     Musculoskeletal   Abdominal   Peds  Hematology negative hematology ROS (+)   Anesthesia Other Findings Glaucoma  Turner's syndrome  Reproductive/Obstetrics negative OB ROS                             Anesthesia Physical Anesthesia Plan  ASA: IV  Anesthesia Plan: General   Post-op Pain Management:    Induction: Intravenous  Airway Management Planned: Oral ETT  Additional Equipment: Arterial line, TEE, CVP, PA Cath and Ultrasound Guidance Line Placement  Intra-op Plan:   Post-operative Plan: Post-operative intubation/ventilation  Informed Consent: I have reviewed the patients History and Physical, chart, labs and discussed the procedure including the risks, benefits and alternatives for the proposed anesthesia with the patient or authorized representative who has indicated his/her understanding and acceptance.   Dental Advisory Given  Plan Discussed with: Anesthesiologist, CRNA and Surgeon  Anesthesia Plan Comments:         Anesthesia Quick Evaluation

## 2015-06-28 NOTE — Anesthesia Postprocedure Evaluation (Signed)
Anesthesia Post Note  Patient: Kristi Erickson  Procedure(s) Performed: Procedure(s) (LRB): REPLACEMENT ASCENDING AORTA AND ARCH (N/A) TRANSESOPHAGEAL ECHOCARDIOGRAM (TEE) (N/A)  Patient location during evaluation: SICU Anesthesia Type: General Level of consciousness: sedated Pain management: pain level controlled Vital Signs Assessment: post-procedure vital signs reviewed and stable Respiratory status: patient remains intubated per anesthesia plan Cardiovascular status: stable Anesthetic complications: no    Last Vitals:  Filed Vitals:   06/28/15 0615 06/28/15 0621  BP:  147/89  Pulse: 53 61  Temp:  37.1 C  Resp:  16    Last Pain: There were no vitals filed for this visit.               Reino KentJudd, [name]

## 2015-06-28 NOTE — Progress Notes (Signed)
NIF -22, VC 1.1L

## 2015-06-28 NOTE — Progress Notes (Signed)
  Echocardiogram Echocardiogram Transesophageal has been performed.  Leta JunglingCooper, Azana Kiesler M 06/28/2015, 8:34 AM

## 2015-06-28 NOTE — H&P (View-Only) (Signed)
[address]       [place] [id number]             [phone]       HPI: Mrs. Kristi Erickson returns today to further discuss treatment of her ascending and arch aneurysm.  05/19/2015 She is a 75 year old woman with Turner's syndrome, horseshoe kidney, glaucoma, hypertension, hyperlipidemia, osteoporosis, gastroesophageal reflux, diverticulitis, and aortic insufficiency. She had an echocardiogram in 2013 which showed moderate aortic insufficiency and preserved left ventricular function. She recently had a repeat echocardiogram. It showed mild-to-moderate aortic insufficiency, normal left ventricular function, and mild-to-moderate aortic insufficiency. The ascending aorta was noted to be dilated. 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 There was marked dilatation of the origin of the innominate artery as well. There was no evidence of dissection.  She is 4 feet 10 inches tall and weighs 114 pounds. She complains of shortness of breath with exertion after walking about a block or a flight of stairs. She says that she could get up 2 flights of stairs if she had to. She does recover within a minute or so of resting. She denies any chest pain, pressure, or tightness associated with shortness of breath. She has not had any presyncopal or syncopal spells. She is not aware of any family history of aneurysms.  After I saw her on 05/19/2015 she wanted a second opinion. She saw Dr. Tyrone SageGerhardt who agreed that she needed surgical repair.  She had cardiac catheterization. There was a 90% stenosis in a tiny branch of the first diagonal. There was no other significant coronary disease.  Her symptoms have not changed since her last visit. She has multiple questions about surgery..  Past Medical History  Diagnosis Date  . Vision disorder   . Stomach problems   . Vitamin D deficiency   .  Turner's syndrome   . GERD (gastroesophageal reflux disease)   . Glaucoma   . Hypertension   . Hyperlipidemia   . Osteoporosis     Past Surgical History  Procedure Laterality Date  . Other surgical history  2010     ear surgery  . Abdominal surgery  1995  . Transthoracic echocardiogram  08/2011    EF=/> 55%, LA mildly dilated, midl mitral annular calcification, trace MR, trace TR, mild AV calcification w/mod regurg, trace pulm valve regurg  . Cardiac catheterization N/A 06/03/2015    Procedure: Left Heart Cath and Coronary Angiography;  Surgeon: Corky CraftsJayadeep S Varanasi, MD;  [facility];  Service: Cardiovascular;  Laterality: N/A;     Current Outpatient Prescriptions  Medication Sig Dispense Refill  . acetaminophen (TYLENOL) 500 MG tablet Take 500 mg by mouth every morning.    Marland Kitchen. alendronate (FOSAMAX) 70 MG tablet Take 70 mg by mouth every Tuesday.     . Ascorbic Acid (VITAMIN C) 1000 MG tablet Take 3,000 mg by mouth daily.     . Cholecalciferol (VITAMIN D) 2000 units CAPS Take 2,000 Units by mouth daily.     Marland Kitchen. dicyclomine (BENTYL) 20 MG tablet Take 20 mg by mouth 2 (two) times daily.   0  . diltiazem (DILACOR XR) 180 MG 24 hr capsule Take 180 mg by mouth daily.     . dorzolamide (TRUSOPT) 2 % ophthalmic solution Place 1 drop into both eyes 2 (two) times daily.    Marland Kitchen. levothyroxine (SYNTHROID,  LEVOTHROID) 88 MCG tablet Take 88 mcg by mouth every morning.     . Multiple Vitamin (MULTIVITAMIN) tablet Take 1 tablet by mouth daily.    . quinapril (ACCUPRIL) 40 MG tablet Take 40 mg by mouth daily.     . simvastatin (ZOCOR) 40 MG tablet Take 40 mg by mouth every morning.     . temazepam (RESTORIL) 15 MG capsule Take 15 mg by mouth at bedtime.      No current facility-administered medications for this visit.   Family History  Problem Relation Age of Onset  . Heart attack Maternal Grandfather   . Heart attack Father   . Skin cancer Brother    Social History   Social  History  . Marital Status: Single    Spouse Name: N/A  . Number of Children: N/A  . Years of Education: M.A.   Occupational History  . Not on file.   Social History Main Topics  . Smoking status: Former Smoker -- 3 years  . Smokeless tobacco: Never Used  . Alcohol Use: 0.0 oz/week    0 Standard drinks or equivalent per week     Comment: 1-2 glasses of wine/week  . Drug Use: No  . Sexual Activity: Not on file   Other Topics Concern  . Not on file   Social History Narrative    Physical Exam BP 116/75 mmHg  Pulse 76  Resp 16  Ht 4\' 10"  (1.473 m)  Wt 114 lb (51.71 kg)  BMI 23.83 kg/m2  SpO55 74% 75 year old woman with small stature and webbed neck No carotid bruits Lungs clear with equal size bilaterally Cardiac regular rate and rhythm with a 2/6 diastolic murmur Abdomen soft nontender  Diagnostic Tests: CT ANGIOGRAPHY CHEST WITH CONTRAST  TECHNIQUE: Multidetector CT imaging of the chest was performed using the standard protocol during bolus administration of intravenous contrast. Multiplanar CT image reconstructions and MIPs were obtained to evaluate the vascular anatomy.  CONTRAST: OMNIPAQUE IOHEXOL 350 MG/ML SOLN  COMPARISON: Prior CT abdomen/ pelvis 01/16/2014  FINDINGS: Mediastinum: 1.8 cm heterogeneous enhancing nodule within the right thyroid gland. The remainder the thoracic inlet is unremarkable. No mediastinal mass or suspicious adenopathy. The thoracic esophagus is within normal limits.  Heart/Vascular: Significant aneurysmal dilatation of the ascending and transverse thoracic aorta. The ascending thoracic aorta measures up to 5.3 cm in diameter. The transverse aorta measures up to 5.1 cm in diameter. 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 Mild atherosclerotic plaque without evidence of significant  stenosis. No effacement of the sino-tubular junction. The aortic root is within normal limits. The heart is normal in size. Calcifications present along the course of the coronary arteries. The main pulmonary artery is normal in size. No evidence of central pulmonary embolus.  Lungs/Pleura: Trace dependent atelectasis in the lower lobes. No suspicious pulmonary nodule or mass. No edema, pleural effusion, pneumothorax or focal consolidation.  Bones/Soft Tissues: No acute fracture or aggressive appearing lytic or blastic osseous lesion.  Upper Abdomen: Visualized upper abdominal organs are unremarkable.  Review of the MIP images confirms the above findings.  IMPRESSION: VASCULAR  1. 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 Ascending thoracic aortic aneurysm. Recommend semi-annual imaging followup by CTA or MRA and referral to cardiothoracic surgery if not already obtained. This recommendation follows 2010 ACCF/AHA/AATS/ACR/ASA/SCA/SCAI/SIR/STS/SVM Guidelines for the Diagnosis and  Management of Patients With Thoracic Aortic Disease. Circulation. 2010; 121: A540-J811 2. 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 3. Fusiform aneurysmal dilatation of the aortic arch measuring up to 5.1 cm in diameter. 4. Atherosclerotic vascular calcifications including coronary artery calcifications. NON VASCULAR  1. Incidental note is made of a 1.8 cm right-sided thyroid nodule. Further evaluation with dedicated thyroid ultrasound could be performed if clinically warranted. 2. Otherwise, unremarkable CT scan of the chest.  Signed,  Sterling Big, MD  [facility]  [facility]   Electronically Signed  By: Malachy Moan M.D.  On:  04/29/2015 16:03  Echocardiogram Study Conclusions  - Left ventricle: The cavity size was normal. Systolic function was  normal. The estimated ejection fraction was in the range of 60%  to 65%. Wall motion was normal; there were no regional wall  motion abnormalities. There was an increased relative  contribution of atrial contraction to ventricular filling.  Doppler parameters are consistent with abnormal left ventricular  relaxation (grade 1 diastolic dysfunction). - Aortic valve: Trileaflet; normal thickness, mildly calcified  leaflets. There was mild to moderate regurgitation directed  eccentrically in the LVOT and towards the mitral anterior  leaflet. - Aorta: Ascending aortic diameter: 51 mm (S). - Ascending aorta: The ascending aorta was severely dilated.  Impressions:  - Normal LVF with grade 1 diastolic dysfunction. AV leaflets are  mildly calcified. No AS. There is mild to moderate AI  eccentrically directed towards the anterior mitral valve leaflet.  The ascending aorta is dilated with measurements ranging from 48  to 51mm in diameter. Recommend Chest CT to get more accurate  assessment.  Cardiac catheterization Conclusion     Small branch of 1st Diag lesion, 90% stenosed. No other significant CAD.  Mid LAD lesion, 25% stenosed.  The left ventricular systolic function is normal.  Large, ascending/arch aneurysm. Grade 3 aortic insufficency.  Continue with f/u from CT surgery for ascending aortic aneurysm treatment.     I personally reviewed her echocardiogram, cardiac catheterization and CT angiogram and concur with the findings as noted above.   Impression: 75 year old woman with a large ascending and arch aneurysm involving the takeoff of the innominate artery and ending between the left common and left subclavian arteries. This measures 5.3 cm. In a lady of the stature that is a massively enlarged aneurysm. She is high risk for dissection  or rupture if untreated. I recommended again that we proceed with repair of the aneurysm. We also discussed management aortic valve. She does have some aortic insufficiency. I suspect this is secondary to the aneurysm and that grafting the aneurysm well fixed aortic insufficiency. They understand that aortic valve repair or replacement could be necessary depending on intraoperative findings. If an aortic valve replacement is necessary a tissue valve would be used.  We again discussed the indications, risks, benefits, and alternatives. She understands the risks include but are not limited to death, MI, DVT, PE, bleeding, need for transfusion, infection(common or atypical), stroke or other neurologic injury, renal failure, respiratory failure, gastrointestinal complications, as well as the possibility of other unforeseeable complications.  She accepts the risk and wishes to proceed.  She has concerns about her living arrangements postoperatively. We will have a case manager discuss possible assisted living with her. She her brother are aware that this plans could change depending on her hospital course.  Plan:  Repair of ascending and arch aneurysm with coronary pulmonary bypass and deep hypothermic circulatory arrest on Monday, 06/28/2015.  Loreli Slot, MD [facility] [phone]

## 2015-06-28 NOTE — Interval H&P Note (Signed)
History and Physical Interval Note:  06/28/2015 7:19 AM  [name]  has presented today for surgery, with the diagnosis of ASCENDING AND ARCH ANEURYSM  The various methods of treatment have been discussed with the patient and family. After consideration of risks, benefits and other options for treatment, the patient has consented to  Procedure(s): REPLACEMENT ASCENDING AORTA AND ARCH (N/A) TRANSESOPHAGEAL ECHOCARDIOGRAM (TEE) (N/A) as a surgical intervention .  The patient's history has been reviewed, patient examined, no change in status, stable for surgery.  I have reviewed the patient's chart and labs.  Questions were answered to the patient's satisfaction.     Loreli SlotSteven C Ziggy Chanthavong

## 2015-06-28 NOTE — Brief Op Note (Addendum)
06/28/2015  1:12 PM       [address]       [place] [id number]             [phone]     06/28/2015  1:13 PM  PATIENT:  Kristi Erickson  75 y.o. female  PRE-OPERATIVE DIAGNOSIS:  ASCENDING AND ARCH ANEURYSM  POST-OPERATIVE DIAGNOSIS:  ASCENDING AND ARCH ANEURYSM  PROCEDURE:  Procedure(s): REPLACEMENT ASCENDING AORTA AND ARCH , 28 HEMASHIELD AORTO- AORTIC ANASTAMOSIS, AORTO-INNOMINATE BYPASS; AORTIC- LEFT COMMON CAROTID BYPASS UNDER DEEP HYPOTHERMIC CIRCULATORY ARREST. TRANSESOPHAGEAL ECHOCARDIOGRAM (TEE)  SURGEON:  Surgeon(s): Loreli SlotSteven C Hendrickson, MD  PHYSICIAN ASSISTANT: WAYNE GOLD PA-C   ANESTHESIA:   general  PATIENT CONDITION:  ICU - intubated and hemodynamically stable.  PRE-OPERATIVE WEIGHT: 51kg  COMPLICATIONS: NO KNOWN  XC= 118 min CPB= 194 min- off with dopamine at 5 and DDD @ 80  Circ arrest= 36 min, Antegrade cerebral perfusion= 27 min

## 2015-06-28 NOTE — Procedures (Signed)
Extubation Procedure Note  Patient Details:   Name: Kristi Erickson DOB: [birth year] MRN: [id number]   Airway Documentation:  Airway 8 mm (Active)  Secured at (cm) 20 cm 06/28/2015  7:06 PM  Measured From Lips 06/28/2015  7:06 PM  Secured Location Right 06/28/2015  7:06 PM  Secured By Pink Tape 06/28/2015  7:06 PM  Site Condition Dry 06/28/2015  7:06 PM    Evaluation  O2 sats: stable throughout Complications: No apparent complications Patient did tolerate procedure well. Bilateral Breath Sounds: Clear   Yes   Patient was extubated to a 4LNC without any complications. Patient has an adequate cough and is able to speak.   Sharene SkeansSilva, Lynann Demetrius C 06/28/2015, 8:42 PM

## 2015-06-29 ENCOUNTER — Inpatient Hospital Stay (HOSPITAL_COMMUNITY): Payer: Medicare Other

## 2015-06-29 ENCOUNTER — Encounter (HOSPITAL_COMMUNITY): Payer: Self-pay | Admitting: Thoracic Surgery (Cardiothoracic Vascular Surgery)

## 2015-06-29 LAB — GLUCOSE, CAPILLARY
GLUCOSE-CAPILLARY: 186 mg/dL — AB (ref 65–99)
GLUCOSE-CAPILLARY: 125 mg/dL — AB (ref 65–99)
GLUCOSE-CAPILLARY: 112 mg/dL — AB (ref 65–99)
GLUCOSE-CAPILLARY: 121 mg/dL — AB (ref 65–99)
GLUCOSE-CAPILLARY: 105 mg/dL — AB (ref 65–99)
GLUCOSE-CAPILLARY: 109 mg/dL — AB (ref 65–99)
GLUCOSE-CAPILLARY: 98 mg/dL (ref 65–99)
GLUCOSE-CAPILLARY: 118 mg/dL — AB (ref 65–99)
GLUCOSE-CAPILLARY: 115 mg/dL — AB (ref 65–99)
Glucose-Capillary: 112 mg/dL — ABNORMAL HIGH (ref 65–99)
Glucose-Capillary: 112 mg/dL — ABNORMAL HIGH (ref 65–99)
Glucose-Capillary: 114 mg/dL — ABNORMAL HIGH (ref 65–99)
Glucose-Capillary: 117 mg/dL — ABNORMAL HIGH (ref 65–99)
Glucose-Capillary: 125 mg/dL — ABNORMAL HIGH (ref 65–99)
Glucose-Capillary: 139 mg/dL — ABNORMAL HIGH (ref 65–99)
Glucose-Capillary: 151 mg/dL — ABNORMAL HIGH (ref 65–99)
Glucose-Capillary: 155 mg/dL — ABNORMAL HIGH (ref 65–99)
Glucose-Capillary: 86 mg/dL (ref 65–99)
Glucose-Capillary: 88 mg/dL (ref 65–99)
Glucose-Capillary: 92 mg/dL (ref 65–99)

## 2015-06-29 LAB — CBC
HCT: 32.1 % — ABNORMAL LOW (ref 36.0–46.0)
HCT: 32.9 % — ABNORMAL LOW (ref 36.0–46.0)
Hemoglobin: 11.1 g/dL — ABNORMAL LOW (ref 12.0–15.0)
Hemoglobin: 10.9 g/dL — ABNORMAL LOW (ref 12.0–15.0)
MCH: 30.4 pg (ref 26.0–34.0)
MCH: 29.5 pg (ref 26.0–34.0)
MCHC: 34.6 g/dL (ref 30.0–36.0)
MCHC: 33.1 g/dL (ref 30.0–36.0)
MCV: 87.9 fL (ref 78.0–100.0)
MCV: 88.9 fL (ref 78.0–100.0)
PLATELETS: 145 10*3/uL — AB (ref 150–400)
PLATELETS: 162 10*3/uL (ref 150–400)
RBC: 3.65 MIL/uL — ABNORMAL LOW (ref 3.87–5.11)
RBC: 3.7 MIL/uL — ABNORMAL LOW (ref 3.87–5.11)
RDW: 13 % (ref 11.5–15.5)
RDW: 13.4 % (ref 11.5–15.5)
WBC: 17.5 10*3/uL — AB (ref 4.0–10.5)
WBC: 20.2 10*3/uL — ABNORMAL HIGH (ref 4.0–10.5)

## 2015-06-29 LAB — PREPARE CRYOPRECIPITATE: Unit division: 0

## 2015-06-29 LAB — BASIC METABOLIC PANEL
Anion gap: 9 (ref 5–15)
BUN: 7 mg/dL (ref 6–20)
CALCIUM: 7.2 mg/dL — AB (ref 8.9–10.3)
CO2: 22 mmol/L (ref 22–32)
Chloride: 102 mmol/L (ref 101–111)
Creatinine, Ser: 0.49 mg/dL (ref 0.44–1.00)
GFR calc Af Amer: >60 mL/min (ref >60)
GLUCOSE: 100 mg/dL — AB (ref 65–99)
Potassium: 3.3 mmol/L — ABNORMAL LOW (ref 3.5–5.1)
Sodium: 133 mmol/L — ABNORMAL LOW (ref 135–145)

## 2015-06-29 LAB — POCT I-STAT, CHEM 8
BUN: 11 mg/dL (ref 6–20)
CREATININE: 0.5 mg/dL (ref 0.44–1.00)
Calcium, Ion: 0.98 mmol/L — ABNORMAL LOW (ref 1.13–1.30)
Chloride: 93 mmol/L — ABNORMAL LOW (ref 101–111)
Glucose, Bld: 151 mg/dL — ABNORMAL HIGH (ref 65–99)
HEMATOCRIT: 32 % — AB (ref 36.0–46.0)
HEMOGLOBIN: 10.9 g/dL — AB (ref 12.0–15.0)
Potassium: 4.1 mmol/L (ref 3.5–5.1)
SODIUM: 130 mmol/L — AB (ref 135–145)
TCO2: 24 mmol/L (ref 0–100)

## 2015-06-29 LAB — PREPARE PLATELET PHERESIS: UNIT DIVISION: 0

## 2015-06-29 LAB — MAGNESIUM
Magnesium: 2 mg/dL (ref 1.7–2.4)
Magnesium: 1.9 mg/dL (ref 1.7–2.4)

## 2015-06-29 LAB — PREPARE FRESH FROZEN PLASMA
UNIT DIVISION: 0
Unit division: 0

## 2015-06-29 LAB — CREATININE, SERUM
Creatinine, Ser: 0.6 mg/dL (ref 0.44–1.00)
GFR calc Af Amer: >60 mL/min (ref >60)
GFR calc non Af Amer: >60 mL/min (ref >60)

## 2015-06-29 MED ORDER — DICYCLOMINE HCL 20 MG PO TABS
20.0000 mg | ORAL_TABLET | Freq: Two times a day (BID) | ORAL | Status: DC
  Administered 2015-06-29 – 2015-07-05 (×13): 20 mg via ORAL
  Filled 2015-06-29 (×15): qty 1

## 2015-06-29 MED ORDER — INSULIN DETEMIR 100 UNIT/ML ~~LOC~~ SOLN
15.0000 [IU] | Freq: Every day | SUBCUTANEOUS | Status: DC
  Administered 2015-06-29 – 2015-06-30 (×2): 15 [IU] via SUBCUTANEOUS
  Filled 2015-06-29 (×3): qty 0.15

## 2015-06-29 MED ORDER — POTASSIUM CHLORIDE 10 MEQ/50ML IV SOLN
10.0000 meq | INTRAVENOUS | Status: AC
  Administered 2015-06-29 (×3): 10 meq via INTRAVENOUS
  Filled 2015-06-29 (×3): qty 50

## 2015-06-29 MED ORDER — INSULIN ASPART 100 UNIT/ML ~~LOC~~ SOLN: 0.0000 [IU] | SUBCUTANEOUS | Status: DC

## 2015-06-29 MED ORDER — SIMVASTATIN 40 MG PO TABS
40.0000 mg | ORAL_TABLET | Freq: Every day | ORAL | Status: DC
  Administered 2015-06-29 – 2015-07-05 (×7): 40 mg via ORAL
  Filled 2015-06-29 (×7): qty 1

## 2015-06-29 MED ORDER — ENOXAPARIN SODIUM 30 MG/0.3ML ~~LOC~~ SOLN
30.0000 mg | Freq: Every day | SUBCUTANEOUS | Status: DC
  Administered 2015-06-29 – 2015-07-04 (×6): 30 mg via SUBCUTANEOUS
  Filled 2015-06-29 (×6): qty 0.3

## 2015-06-29 MED ORDER — INSULIN ASPART 100 UNIT/ML ~~LOC~~ SOLN
0.0000 [IU] | SUBCUTANEOUS | Status: DC
  Administered 2015-06-29 (×2): 2 [IU] via SUBCUTANEOUS

## 2015-06-29 MED ORDER — MIRTAZAPINE 45 MG PO TABS
45.0000 mg | ORAL_TABLET | Freq: Every day | ORAL | Status: DC
  Administered 2015-06-29 – 2015-07-04 (×6): 45 mg via ORAL
  Filled 2015-06-29 (×2): qty 3
  Filled 2015-06-29 (×3): qty 1
  Filled 2015-06-29: qty 3
  Filled 2015-06-29: qty 1

## 2015-06-29 MED ORDER — INSULIN DETEMIR 100 UNIT/ML ~~LOC~~ SOLN: 15.0000 [IU] | Freq: Every day | SUBCUTANEOUS | Status: DC

## 2015-06-29 MED ORDER — POTASSIUM CHLORIDE 10 MEQ/50ML IV SOLN
10.0000 meq | INTRAVENOUS | Status: AC
  Administered 2015-06-29 (×2): 10 meq via INTRAVENOUS
  Filled 2015-06-29: qty 50

## 2015-06-29 MED ORDER — TEMAZEPAM 15 MG PO CAPS
15.0000 mg | ORAL_CAPSULE | Freq: Every day | ORAL | Status: DC
  Administered 2015-06-29 – 2015-07-04 (×6): 15 mg via ORAL
  Filled 2015-06-29 (×3): qty 1
  Filled 2015-06-29 (×3): qty 2

## 2015-06-29 NOTE — Progress Notes (Signed)
   06/29/15 1100  Clinical Encounter Type  Visited With Patient and family together  Visit Type Initial  Spiritual Encounters  Spiritual Needs Emotional;Prayer  Stress Factors  Patient Stress Factors Health changes  Patient slightly confused about identity of chaplain, but understood finally. Hearing is not good. [facility] but friends said visits would still be appreciated. Patient quite faithful. Juel Bellerose, Chaplain

## 2015-06-29 NOTE — Progress Notes (Signed)
Patient ID: Kristi Erickson, female   DOB: Jun 07, 1940, 75 y.o.   MRN: [id number] EVENING ROUNDS NOTE :     [address]       [place] [id number]             [phone]                 1 Day Post-Op Procedure(s) (LRB): REPLACEMENT ASCENDING AORTA AND ARCH (N/A) TRANSESOPHAGEAL ECHOCARDIOGRAM (TEE) (N/A)  Total Length of Stay:  LOS: 1 day  BP 112/97 mmHg  Pulse 72  Temp(Src) 97.5 F (36.4 C) (Oral)  Resp 13  Ht 4\' 10"  (1.473 m)  Wt 119 lb 4.3 oz (54.1 kg)  BMI 24.93 kg/m2  SpO2 98%  .Intake/Output      06/19 0701 - 06/20 0700 06/20 0701 - 06/21 0700   I.V. (mL/kg) 3597.3 (66.5) 340.2 (6.3)   Blood 1469    IV Piggyback 2650 150   Total Intake(mL/kg) 7716.3 (142.6) 490.2 (9.1)   Urine (mL/kg/hr) 13025 (10) 550 (0.9)   Blood 1100 (0.8)    Chest Tube 335 (0.3) 100 (0.2)   Total Output [id number] 650   Net -6743.7 -159.8          . sodium chloride Stopped (06/29/15 1000)  . sodium chloride    . sodium chloride Stopped (06/29/15 1000)  . amiodarone 30 mg/hr (06/29/15 1214)  . dexmedetomidine Stopped (06/28/15 1745)  . DOPamine Stopped (06/29/15 0900)  . lactated ringers Stopped (06/28/15 2236)  . lactated ringers 20 mL/hr at 06/29/15 1200  . nitroGLYCERIN Stopped (06/28/15 1732)  . phenylephrine (NEO-SYNEPHRINE) Adult infusion Stopped (06/28/15 1805)     Lab Results  Component Value Date   WBC 17.5* 06/29/2015   HGB 10.9* 06/29/2015   HCT 32.0* 06/29/2015   PLT 145* 06/29/2015   GLUCOSE 151* 06/29/2015   ALT 18 06/24/2015   AST 25 06/24/2015   NA 130* 06/29/2015   K 4.1 06/29/2015   CL 93* 06/29/2015   CREATININE 0.50 06/29/2015   BUN 11 06/29/2015   CO2 22 06/29/2015   TSH 2.87 06/01/2015   INR 1.38 06/28/2015   HGBA1C 5.5 06/24/2015   Alert up in chair Renal function stable   Delight OvensEdward B Kenitha Glendinning MD  Beeper [phone] Office [phone] 06/29/2015 6:00 PM

## 2015-06-29 NOTE — Progress Notes (Signed)
1 Day Post-Op Procedure(s) (LRB): REPLACEMENT ASCENDING AORTA AND ARCH (N/A) TRANSESOPHAGEAL ECHOCARDIOGRAM (TEE) (N/A) Subjective: Some pain, but less than I thought it would be  Objective: Vital signs in last 24 hours: Temp:  [94.3 F (34.6 C)-98.8 F (37.1 C)] 98.8 F (37.1 C) (06/20 0600) Pulse Rate:  [79-90] 80 (06/20 0600) Cardiac Rhythm:  [-] Normal sinus rhythm (06/20 0745) Resp:  [7-22] 12 (06/20 0600) BP: (95-126)/(62-83) 96/62 mmHg (06/20 0600) SpO2:  [94 %-99 %] 98 % (06/20 0600) Arterial Line BP: (92-149)/(52-81) 120/56 mmHg (06/20 0600) FiO2 (%):  [40 %-80 %] 40 % (06/19 2000) Weight:  [119 lb 4.3 oz (54.1 kg)] 119 lb 4.3 oz (54.1 kg) (06/20 0500)  Hemodynamic parameters for last 24 hours: PAP: (23-37)/(9-17) 30/14 mmHg CO:  [2.4 L/min-3.9 L/min] 3.9 L/min CI:  [1.7 L/min/m2-2.7 L/min/m2] 2.7 L/min/m2  Intake/Output from previous day: 06/19 0701 - 06/20 0700 In: 7716.3 [I.V.:3597.3; FAOZH:0865Blood:1469; IV Piggyback:2650] Out: [id number] [Urine:13025; Blood:1100; Chest Tube:335] Intake/Output this shift:    General appearance: alert, cooperative and no distress Neurologic: intact Heart: regular rate and rhythm Lungs: diminished breath sounds bibasilar Abdomen: normal findings: soft, non-tender Extremities: well perfused  Lab Results:  Recent Labs  06/28/15 2030 06/28/15 2032 06/29/15 0452  WBC 20.0*  --  17.5*  HGB 11.9* 10.9* 11.1*  HCT 34.7* 32.0* 32.1*  PLT 184  --  145*   BMET:  Recent Labs  06/28/15 2032 06/29/15 0452  NA 137 133*  K 3.4* 3.3*  CL 105 102  CO2  --  22  GLUCOSE 110* 100*  BUN 7 7  CREATININE 0.30* 0.49  CALCIUM  --  7.2*    PT/INR:  Recent Labs  06/28/15 1500  LABPROT 17.1*  INR 1.38   ABG    Component Value Date/Time   PHART 7.405 06/28/2015 2236   HCO3 21.4 06/28/2015 2236   TCO2 22 06/28/2015 2236   ACIDBASEDEF 3.0* 06/28/2015 2236   O2SAT 98.0 06/28/2015 2236   CBG (last 3)   Recent Labs  06/28/15 2158  06/28/15 2302 06/29/15 0005  GLUCAP 105* 151* 112*    Assessment/Plan: S/P Procedure(s) (LRB): REPLACEMENT ASCENDING AORTA AND ARCH (N/A) TRANSESOPHAGEAL ECHOCARDIOGRAM (TEE) (N/A) -  CV- s/p repair ascending and arch aneurysm  In SR. On amiodarone drip for intraop atrial fib- will continue IV today  QTc long, recheck tomorrow  CI= 2.7- dc swan  RESP- extubated, IS for atelectasis  RENAL- horseshoe kidney. Creatinine normal, high volume UO without diuretics  Hypokalemia- supplement K  ENDO_ CBG OK transition to levemir + SSI  Anemia- better after transfusion- follow  Thrombocytopenia- better after transfusion  Coagulopathy- corrected, no signs of bleeding  DC CT  Mobilize  DVT prophylaxis- start low dose enoxaparin- use 30 mg due to small size, multiple suture lines   LOS: 1 day    Loreli SlotSteven C Jacquelin Krajewski 06/29/2015

## 2015-06-30 ENCOUNTER — Inpatient Hospital Stay (HOSPITAL_COMMUNITY): Payer: Medicare Other

## 2015-06-30 LAB — BASIC METABOLIC PANEL
Anion gap: 8 (ref 5–15)
BUN: 13 mg/dL (ref 6–20)
CALCIUM: 7.2 mg/dL — AB (ref 8.9–10.3)
CO2: 25 mmol/L (ref 22–32)
CREATININE: 0.49 mg/dL (ref 0.44–1.00)
Chloride: 94 mmol/L — ABNORMAL LOW (ref 101–111)
GFR calc Af Amer: >60 mL/min (ref >60)
Glucose, Bld: 135 mg/dL — ABNORMAL HIGH (ref 65–99)
Potassium: 3.5 mmol/L (ref 3.5–5.1)
SODIUM: 127 mmol/L — AB (ref 135–145)

## 2015-06-30 LAB — GLUCOSE, CAPILLARY
GLUCOSE-CAPILLARY: 114 mg/dL — AB (ref 65–99)
GLUCOSE-CAPILLARY: 144 mg/dL — AB (ref 65–99)
Glucose-Capillary: 122 mg/dL — ABNORMAL HIGH (ref 65–99)
Glucose-Capillary: 124 mg/dL — ABNORMAL HIGH (ref 65–99)
Glucose-Capillary: 130 mg/dL — ABNORMAL HIGH (ref 65–99)
Glucose-Capillary: 136 mg/dL — ABNORMAL HIGH (ref 65–99)

## 2015-06-30 LAB — CBC
HEMATOCRIT: 31.6 % — AB (ref 36.0–46.0)
Hemoglobin: 10.6 g/dL — ABNORMAL LOW (ref 12.0–15.0)
MCH: 29.8 pg (ref 26.0–34.0)
MCHC: 33.5 g/dL (ref 30.0–36.0)
MCV: 88.8 fL (ref 78.0–100.0)
Platelets: 147 10*3/uL — ABNORMAL LOW (ref 150–400)
RBC: 3.56 MIL/uL — ABNORMAL LOW (ref 3.87–5.11)
RDW: 13.4 % (ref 11.5–15.5)
WBC: 17.2 10*3/uL — AB (ref 4.0–10.5)

## 2015-06-30 MED ORDER — METOPROLOL TARTRATE 25 MG PO TABS
25.0000 mg | ORAL_TABLET | Freq: Two times a day (BID) | ORAL | Status: DC
  Administered 2015-06-30 – 2015-07-05 (×10): 25 mg via ORAL
  Filled 2015-06-30 (×11): qty 1

## 2015-06-30 MED ORDER — POTASSIUM CHLORIDE 10 MEQ/50ML IV SOLN
10.0000 meq | INTRAVENOUS | Status: AC
  Administered 2015-06-30 (×2): 10 meq via INTRAVENOUS

## 2015-06-30 MED ORDER — POTASSIUM CHLORIDE 10 MEQ/50ML IV SOLN
10.0000 meq | INTRAVENOUS | Status: AC | PRN
  Administered 2015-06-30 – 2015-07-02 (×4): 10 meq via INTRAVENOUS
  Filled 2015-06-30 (×4): qty 50

## 2015-06-30 MED ORDER — FUROSEMIDE 10 MG/ML IJ SOLN
20.0000 mg | Freq: Two times a day (BID) | INTRAMUSCULAR | Status: AC
  Administered 2015-06-30 (×2): 20 mg via INTRAVENOUS
  Filled 2015-06-30 (×2): qty 2

## 2015-06-30 MED ORDER — METOPROLOL TARTRATE 25 MG/10 ML ORAL SUSPENSION
25.0000 mg | Freq: Two times a day (BID) | ORAL | Status: DC
  Filled 2015-06-30: qty 10

## 2015-06-30 MED ORDER — INSULIN ASPART 100 UNIT/ML ~~LOC~~ SOLN: 0.0000 [IU] | Freq: Three times a day (TID) | SUBCUTANEOUS | Status: DC

## 2015-06-30 NOTE — Progress Notes (Signed)
2 Days Post-Op Procedure(s) (LRB): REPLACEMENT ASCENDING AORTA AND ARCH (N/A) TRANSESOPHAGEAL ECHOCARDIOGRAM (TEE) (N/A) Subjective: C/o nausea when she got up  Objective: Vital signs in last 24 hours: Temp:  [97.5 F (36.4 C)-99.5 F (37.5 C)] 98.7 F (37.1 C) (06/21 0400) Pulse Rate:  [66-78] 66 (06/21 0700) Cardiac Rhythm:  [-] Normal sinus rhythm (06/21 0400) Resp:  [13-23] 14 (06/21 0700) BP: (98-140)/(64-97) 132/78 mmHg (06/21 0700) SpO2:  [89 %-100 %] 91 % (06/21 0700) Arterial Line BP: (132)/(64) 132/64 mmHg (06/20 0845) Weight:  [121 lb 4.1 oz (55 kg)] 121 lb 4.1 oz (55 kg) (06/21 0500)  Hemodynamic parameters for last 24 hours:    Intake/Output from previous day: 06/20 0701 - 06/21 0700 In: 2043.8 [P.O.:840; I.V.:1003.8; IV Piggyback:200] Out: 1075 [Urine:975; Chest Tube:100] Intake/Output this shift:    General appearance: alert, cooperative and no distress Neurologic: nonfocal Heart: regular rate and rhythm Lungs: diminished breath sounds bibasilar Abdomen: mildly distended, good BS Wound: dressing in place  Lab Results:  Recent Labs  06/29/15 1801 06/30/15 0500  WBC 20.2* 17.2*  HGB 10.9* 10.6*  HCT 32.9* 31.6*  PLT 162 147*   BMET:  Recent Labs  06/29/15 0452 06/29/15 1749 06/29/15 1801 06/30/15 0500  NA 133* 130*  --  127*  K 3.3* 4.1  --  3.5  CL 102 93*  --  94*  CO2 22  --   --  25  GLUCOSE 100* 151*  --  135*  BUN 7 11  --  13  CREATININE 0.49 0.50 0.60 0.49  CALCIUM 7.2*  --   --  7.2*    PT/INR:  Recent Labs  06/28/15 1500  LABPROT 17.1*  INR 1.38   ABG    Component Value Date/Time   PHART 7.405 06/28/2015 2236   HCO3 21.4 06/28/2015 2236   TCO2 24 06/29/2015 1749   ACIDBASEDEF 3.0* 06/28/2015 2236   O2SAT 98.0 06/28/2015 2236   CBG (last 3)   Recent Labs  06/29/15 1938 06/29/15 2336 06/30/15 0355  GLUCAP 139* 112* 122*    Assessment/Plan: S/P Procedure(s) (LRB): REPLACEMENT ASCENDING AORTA AND ARCH  (N/A) TRANSESOPHAGEAL ECHOCARDIOGRAM (TEE) (N/A) POD # 2 repair ascending and arch aneurysm  CV- in SR, BP starting to increase  QTc still > 500 but trending down  Will dc amiodarone for now and increase metoprolol  RESP- bilateral pleural effusions- diurese  RENAL- creatinine OK.   Hyponatremia, hypokalemia- supplement K  ENDO_ CBG OK, change to AC/HS  DVT prophylaxis- SCD + enoxaparin  Anemia secondary to ABL- follow  Mobilize   LOS: 2 days    Loreli SlotSteven C Jabbar Palmero 06/30/2015

## 2015-06-30 NOTE — Clinical Social Work Note (Signed)
Clinical Social Work Assessment  Patient Details  Name: Kristi Erickson MRN: [id number] Date of Birth: 02-Feb-1940  Date of referral:  06/30/15               Reason for consult:  Discharge Planning                Permission sought to share information with:  Chartered certified accountant granted to share information::  Yes, Release of Information Signed  Name::     Kristi Erickson  Agency::  SNFs  Relationship::  Brother  Contact Information:     Housing/Transportation Living arrangements for the past 2 months:  [place] of Information:  Patient Patient Interpreter Needed:  None Criminal Activity/Legal Involvement Pertinent to Current Situation/Hospitalization:  No - Comment as needed Significant Relationships:  None Lives with:  Self Do you feel safe going back to the place where you live?  Yes Need for family participation in patient care:  Yes (Comment)  Care giving concerns:  CSW was informed by [facility] that patient's family had concerns about patient returning home and wanted information on SNFs. CSW met with patient at beside there was no family present. Patient reported she might need SNF for short term rehab.   Social Worker assessment / plan:  CSW met with patient at beside to complete assessment. Patient was resting comfortably in bed. CSW explained SNF search and placement process to the patient and answered her questions. Patient reported her supports as her brother Kristi Erickson. Per patient request CSW called [facility] and left message for return call.  CSW will continue to follow for discharge planning concerns.   Employment status:  Retired Nurse, adult PT Recommendations:  Not assessed at this time Information / Referral to community resources:  [place]  Patient/Family's Response to care:  The patient appears happy with the care she is receiving in hospital and is appreciative of CSW  assistance.  Patient/Family's Understanding of and Emotional Response to Diagnosis, Current Treatment, and Prognosis:  The patient has a good understanding of why she was admitted. She understands the care plan and what she will need post discharge.  Emotional Assessment Appearance:  Appears stated age Attitude/Demeanor/Rapport:   (Patient was welcoming of CSW and appropriate.) Affect (typically observed):  Accepting, Calm, Appropriate Orientation:  Oriented to Self, Oriented to Place, Oriented to  Time, Oriented to Situation Alcohol / Substance use:  Not Applicable Psych involvement (Current and /or in the community):  No (Comment)  Discharge Needs  Concerns to be addressed:  Discharge Planning Concerns Readmission within the last 30 days:  No Current discharge risk:  Lives alone Barriers to Discharge:  Continued Medical Work up   [employer], [place] 06/30/2015, 3:31 PM [phone]

## 2015-06-30 NOTE — Care Management Important Message (Signed)
Important Message  Patient Details  Name: [name] MRN: [id number] Date of Birth: [birth year]   Medicare Important Message Given:  Yes    Bernadette HoitShoffner,  Antolin Coleman 06/30/2015, 1:41 PM

## 2015-06-30 NOTE — Progress Notes (Signed)
Chaplain offered prayers. Meigan Pates, Chaplain

## 2015-06-30 NOTE — Progress Notes (Signed)
T[facility] BRIEF SICU PROGRESS NOTE  2 Days Post-Op  S/P Procedure(s) (LRB): REPLACEMENT ASCENDING AORTA AND ARCH (N/A) TRANSESOPHAGEAL ECHOCARDIOGRAM (TEE) (N/A)   Stable day NSR w/ stable BP O2 sats 95-97% UOP adequate  Plan: Continue current plan  Purcell Nailslarence H Gloriana Piltz, MD 06/30/2015 6:35 PM

## 2015-07-01 ENCOUNTER — Inpatient Hospital Stay (HOSPITAL_COMMUNITY): Payer: Medicare Other

## 2015-07-01 LAB — BASIC METABOLIC PANEL
Anion gap: 7 (ref 5–15)
BUN: 15 mg/dL (ref 6–20)
CHLORIDE: 93 mmol/L — AB (ref 101–111)
CO2: 26 mmol/L (ref 22–32)
CREATININE: 0.39 mg/dL — AB (ref 0.44–1.00)
Calcium: 7.2 mg/dL — ABNORMAL LOW (ref 8.9–10.3)
GFR calc Af Amer: >60 mL/min (ref >60)
GFR calc non Af Amer: >60 mL/min (ref >60)
GLUCOSE: 81 mg/dL (ref 65–99)
Potassium: 2.8 mmol/L — ABNORMAL LOW (ref 3.5–5.1)
Sodium: 126 mmol/L — ABNORMAL LOW (ref 135–145)

## 2015-07-01 LAB — POCT I-STAT, CHEM 8
BUN: 14 mg/dL (ref 6–20)
CHLORIDE: 94 mmol/L — AB (ref 101–111)
CREATININE: 0.3 mg/dL — AB (ref 0.44–1.00)
Calcium, Ion: 1.06 mmol/L — ABNORMAL LOW (ref 1.13–1.30)
Glucose, Bld: 65 mg/dL (ref 65–99)
HEMATOCRIT: 28 % — AB (ref 36.0–46.0)
Hemoglobin: 9.5 g/dL — ABNORMAL LOW (ref 12.0–15.0)
POTASSIUM: 3.8 mmol/L (ref 3.5–5.1)
Sodium: 131 mmol/L — ABNORMAL LOW (ref 135–145)
TCO2: 20 mmol/L (ref 0–100)

## 2015-07-01 LAB — CBC
HEMATOCRIT: 30.5 % — AB (ref 36.0–46.0)
HEMOGLOBIN: 10.4 g/dL — AB (ref 12.0–15.0)
MCH: 29.7 pg (ref 26.0–34.0)
MCHC: 34.1 g/dL (ref 30.0–36.0)
MCV: 87.1 fL (ref 78.0–100.0)
Platelets: 146 10*3/uL — ABNORMAL LOW (ref 150–400)
RBC: 3.5 MIL/uL — ABNORMAL LOW (ref 3.87–5.11)
RDW: 12.9 % (ref 11.5–15.5)
WBC: 15 10*3/uL — AB (ref 4.0–10.5)

## 2015-07-01 LAB — GLUCOSE, CAPILLARY
GLUCOSE-CAPILLARY: 67 mg/dL (ref 65–99)
GLUCOSE-CAPILLARY: 62 mg/dL — AB (ref 65–99)
GLUCOSE-CAPILLARY: 87 mg/dL (ref 65–99)
GLUCOSE-CAPILLARY: 74 mg/dL (ref 65–99)
GLUCOSE-CAPILLARY: 77 mg/dL (ref 65–99)
Glucose-Capillary: 84 mg/dL (ref 65–99)
Glucose-Capillary: 82 mg/dL (ref 65–99)

## 2015-07-01 MED ORDER — POTASSIUM CHLORIDE 10 MEQ/50ML IV SOLN
10.0000 meq | INTRAVENOUS | Status: AC
  Administered 2015-07-01 (×3): 10 meq via INTRAVENOUS
  Filled 2015-07-01 (×2): qty 50

## 2015-07-01 MED ORDER — LORAZEPAM 2 MG/ML IJ SOLN
0.5000 mg | Freq: Once | INTRAMUSCULAR | Status: AC
Start: 2015-07-01 — End: 2015-07-01
  Administered 2015-07-01: 0.5 mg via INTRAVENOUS

## 2015-07-01 MED ORDER — LORAZEPAM 2 MG/ML IJ SOLN
0.5000 mg | Freq: Four times a day (QID) | INTRAMUSCULAR | Status: DC | PRN
  Administered 2015-07-02 – 2015-07-03 (×2): 0.5 mg via INTRAVENOUS
  Filled 2015-07-01 (×2): qty 1

## 2015-07-01 MED ORDER — LORAZEPAM 2 MG/ML IJ SOLN
INTRAMUSCULAR | Status: AC
  Administered 2015-07-01: 0.5 mg via INTRAVENOUS
  Filled 2015-07-01: qty 1

## 2015-07-01 MED ORDER — POTASSIUM CHLORIDE 10 MEQ/50ML IV SOLN
10.0000 meq | INTRAVENOUS | Status: AC
  Administered 2015-07-01 (×3): 10 meq via INTRAVENOUS
  Filled 2015-07-01 (×3): qty 50

## 2015-07-01 NOTE — Progress Notes (Signed)
Hypoglycemic Event  CBG: 62  Treatment: 120ml of apple juice   Symptoms: none   Follow-up CBG: Time: 0933 CBG Result: 87  Possible Reasons for Event: pt not eating and on levemir and not diabetic   Comments/MD notified: Will notify MD and monitor pt closely    Curly ShoresFowler, Maronda Caison E

## 2015-07-01 NOTE — NC FL2 (Signed)
MEDICAID FL2 LEVEL OF CARE SCREENING TOOL     IDENTIFICATION  Patient Name: Kristi Erickson Birthdate: 1940/03/24 Sex: female Admission Date (Current Location): 06/28/2015  [facility] and [place] Number:  Producer, television/film/video and Address:  [facility], [address] [place], [place] [address]      Provider Number:    Attending Physician Name and Address:  Loreli Slot, MD  Relative Name and Phone Number:       Current Level of Care: Hospital Recommended Level of Care: Nursing Facility Prior Approval Number:    Date Approved/Denied:   PASRR Number: [id number]  Discharge Plan: SNF    Current Diagnoses: Patient Active Problem List   Diagnosis Date Noted  . Preoperative cardiovascular examination   . Ascending aortic aneurysm (HCC)   . Aneurysm of ascending aorta (HCC) 05/19/2015  . 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 Thyroid nodule 05/19/2015  . AI (aortic insufficiency) 03/31/2015  . Essential hypertension 03/31/2015  . Dyslipidemia 03/31/2015    Orientation RESPIRATION BLADDER Height & Weight     Self, Situation, Place  O2 (2 L/ min) Indwelling catheter Weight: 119 lb 0.8 oz (54 kg) Height:   (147.3 cm)  BEHAVIORAL SYMPTOMS/MOOD NEUROLOGICAL BOWEL NUTRITION STATUS   (None)  (None) Continent Diet (Clear Liquids)  AMBULATORY STATUS COMMUNICATION OF NEEDS Skin   Extensive Assist Verbally Surgical wounds (Incision Closed: Chest Silver Hydrofiber and Chest RT Silver Hydrofiber)                       Personal Care Assistance Level of Assistance  Bathing, Feeding, Dressing Bathing Assistance: Limited assistance Feeding assistance: Independent Dressing Assistance: Limited assistance     Functional Limitations Info  Hearing, Speech, Sight Sight Info: Adequate Hearing Info: Impaired Speech Info: Adequate    SPECIAL CARE FACTORS  FREQUENCY  PT (By licensed PT), OT (By licensed OT)     PT Frequency: 5/ week OT Frequency: 5/ week            Contractures Contractures Info: Not present    Additional Factors Info  Code Status, Allergies Code Status Info: Full Allergies Info: Shellfish-derived Products           Current Medications (07/01/2015):  This is the current hospital active medication list Current Facility-Administered Medications  Medication Dose Route Frequency Provider Last Rate Last Dose  . 0.9 %  sodium chloride infusion  250 mL Intravenous Continuous Wayne E Gold, PA-C   0 mL at 06/29/15 0600  . 0.9 %  sodium chloride infusion   Intravenous Continuous Rowe Clack, PA-C   Stopped at 06/29/15 1000  . acetaminophen (TYLENOL) tablet 1,000 mg  1,000 mg Oral Q6H Wayne E Gold, PA-C   1,000 mg at 07/01/15 1153   Or  . acetaminophen (TYLENOL) solution 1,000 mg  1,000 mg Per Tube Q6H Wayne E Gold, PA-C   1,000 mg at 06/29/15 0000  . aspirin EC tablet 325 mg  325 mg Oral Daily Rowe Clack, PA-C   325 mg at 07/01/15 9147   Or  . aspirin chewable tablet 324 mg  324 mg Per Tube Daily Wayne E Gold, PA-C      . bisacodyl (DULCOLAX) EC tablet 10 mg  10 mg Oral Daily Rowe Clack, PA-C   10 mg at 07/01/15 8295   Or  . bisacodyl (DULCOLAX) suppository 10  mg  10 mg Rectal Daily Wayne E Gold, PA-C      . dexmedetomidine (PRECEDEX) 200 MCG/50ML (4 mcg/mL) infusion  0-0.7 mcg/kg/hr Intravenous Continuous Rowe ClackWayne E Gold, PA-C   Stopped at 06/28/15 1745  . dicyclomine (BENTYL) tablet 20 mg  20 mg Oral BID Loreli SlotSteven C Hendrickson, MD   20 mg at 07/01/15 [id number]  . docusate sodium (COLACE) capsule 200 mg  200 mg Oral Daily Wayne E Gold, PA-C   200 mg at 07/01/15 0905  . dorzolamide (TRUSOPT) 2 % ophthalmic solution 1 drop  1 drop Both Eyes BID Rowe ClackWayne E Gold, PA-C   1 drop at 07/01/15 0910  . enoxaparin (LOVENOX) injection 30 mg  30 mg Subcutaneous QHS Loreli SlotSteven C Hendrickson, MD   30 mg at 06/30/15 2132  . insulin aspart (novoLOG)  injection 0-9 Units  0-9 Units Subcutaneous TID WC Loreli SlotSteven C Hendrickson, MD   0 Units at 06/30/15 1230  . insulin detemir (LEVEMIR) injection 15 Units  15 Units Subcutaneous Daily Loreli SlotSteven C Hendrickson, MD   15 Units at 06/30/15 [phone]  . lactated ringers infusion 500 mL  500 mL Intravenous Once PRN Rowe ClackWayne E Gold, PA-C      . lactated ringers infusion   Intravenous Continuous Rowe ClackWayne E Gold, PA-C   Stopped at 06/28/15 2236  . lactated ringers infusion   Intravenous Continuous Rowe ClackWayne E Gold, PA-C 20 mL/hr at 07/01/15 0800    . levothyroxine (SYNTHROID, LEVOTHROID) tablet 88 mcg  88 mcg Oral QAC breakfast Rowe ClackWayne E Gold, PA-C   88 mcg at 07/01/15 0756  . metoprolol (LOPRESSOR) injection 2.5-5 mg  2.5-5 mg Intravenous Q2H PRN Rowe ClackWayne E Gold, PA-C   2.5 mg at 06/30/15 2333  . metoprolol tartrate (LOPRESSOR) tablet 25 mg  25 mg Oral BID Loreli SlotSteven C Hendrickson, MD   25 mg at 06/30/15 2133   Or  . metoprolol tartrate (LOPRESSOR) 25 mg/10 mL oral suspension 25 mg  25 mg Per Tube BID Loreli SlotSteven C Hendrickson, MD      . mirtazapine (REMERON) tablet 45 mg  45 mg Oral QHS Loreli SlotSteven C Hendrickson, MD   45 mg at 06/30/15 2133  . morphine 2 MG/ML injection 2-5 mg  2-5 mg Intravenous Q1H PRN Rowe ClackWayne E Gold, PA-C   2 mg at 06/30/15 2243  . nitroGLYCERIN 50 mg in dextrose 5 % 250 mL (0.2 mg/mL) infusion  0-100 mcg/min Intravenous Titrated Rowe ClackWayne E Gold, PA-C   Stopped at 06/28/15 1732  . ondansetron (ZOFRAN) injection 4 mg  4 mg Intravenous Q6H PRN Wayne E Gold, PA-C   4 mg at 06/30/15 1254  . oxyCODONE (Oxy IR/ROXICODONE) immediate release tablet 5-10 mg  5-10 mg Oral Q3H PRN Rowe ClackWayne E Gold, PA-C   5 mg at 06/29/15 [id number]  . pantoprazole (PROTONIX) EC tablet 40 mg  40 mg Oral Daily Rowe ClackWayne E Gold, PA-C   40 mg at 07/01/15 0905  . phenylephrine (NEO-SYNEPHRINE) 20 mg in dextrose 5 % 250 mL (0.08 mg/mL) infusion  0-100 mcg/min Intravenous Titrated Rowe ClackWayne E Gold, PA-C   Stopped at 06/28/15 1805  . potassium chloride 10 mEq in 50 mL *CENTRAL LINE*  IVPB  10 mEq Intravenous Q1H PRN Loreli SlotSteven C Hendrickson, MD   10 mEq at 06/30/15 0829  . potassium chloride 10 mEq in 50 mL *CENTRAL LINE* IVPB  10 mEq Intravenous Q1 Hr x 3 Loreli SlotSteven C Hendrickson, MD   10 mEq at 07/01/15 1154  . simvastatin (ZOCOR) tablet 40 mg  40 mg  Oral Daily Loreli SlotSteven C Hendrickson, MD   40 mg at 07/01/15 0905  . sodium chloride flush (NS) 0.9 % injection 3 mL  3 mL Intravenous Q12H Wayne E Gold, PA-C   3 mL at 07/01/15 0910  . sodium chloride flush (NS) 0.9 % injection 3 mL  3 mL Intravenous PRN Wayne E Gold, PA-C      . temazepam (RESTORIL) capsule 15 mg  15 mg Oral QHS Loreli SlotSteven C Hendrickson, MD   15 mg at 06/30/15 2133  . traMADol (ULTRAM) tablet 50-100 mg  50-100 mg Oral Q4H PRN Rowe ClackWayne E Gold, PA-C   50 mg at 06/29/15 2347     Discharge Medications: Please see discharge summary for a list of discharge medications.  Relevant Imaging Results:  Relevant Lab Results:   Additional Information SSN: [id number]  Reggy EyeLaShonda A Kazden Largo, LCSW

## 2015-07-01 NOTE — Progress Notes (Signed)
      [address]       [place] [id number]             [phone]      Feels better this PM  Ambulated around unit today  BP 117/65 mmHg  Pulse 67  Temp(Src) 98.5 F (36.9 C) (Oral)  Resp 16  Ht 4\' 10"  (1.473 m)  Wt 119 lb 0.8 oz (54 kg)  BMI 24.89 kg/m2  SpO2 98%   Intake/Output Summary (Last 24 hours) at 07/01/15 1815 Last data filed at 07/01/15 1700  Gross per 24 hour  Intake    713 ml  Output   2300 ml  Net  -1587 ml    K= 3.8  Doing well  Kristi SpareSteven C. Dorris FetchHendrickson, MD [facility] [phone]

## 2015-07-01 NOTE — Progress Notes (Signed)
Patient agitated, combative, pulling lines and confused of location.  Concerned for patient safety.  Order for safety sitter placed.

## 2015-07-01 NOTE — Progress Notes (Addendum)
Patient agitated and restless for several hours.  Patient kicking, trying to pull lines and climb out of bed.  Patient alert and oriented X 4.  Placed safety mitts, fall mats, bed in lowest position, and bed alarm to on position.  Paged Dr. Cornelius Moraswen to request direction.  Given verbal order for .5 mg of Ativan X 1 time order.  Pt sleeping and calm after Ativan administration.

## 2015-07-01 NOTE — Progress Notes (Signed)
   07/01/15 1152  Clinical Encounter Type  Visited With Patient  Visit Type Follow-up  Referral From Nurse  Consult/Referral To Chaplain  Spiritual Encounters  Spiritual Needs Prayer  Stress Factors  Patient Stress Factors Health changes;Loss of control;Major life changes  Chaplain consult completed, patient expresses feeling better but prayer support needed, provided ministry of presence, emotional support and prayer. Staff support provided as well. Advised that continued services are available upon request 24/7.

## 2015-07-01 NOTE — Progress Notes (Signed)
3 Days Post-Op Procedure(s) (LRB): REPLACEMENT ASCENDING AORTA AND ARCH (N/A) TRANSESOPHAGEAL ECHOCARDIOGRAM (TEE) (N/A) Subjective: Confused and agitated overnight  Objective: Vital signs in last 24 hours: Temp:  [97.4 F (36.3 C)-98.3 F (36.8 C)] 98.3 F (36.8 C) (06/22 0405) Pulse Rate:  [59-72] 68 (06/22 0700) Cardiac Rhythm:  [-] Normal sinus rhythm (06/22 0400) Resp:  [14-20] 17 (06/22 0700) BP: (91-174)/(51-145) 123/78 mmHg (06/22 0700) SpO2:  [88 %-98 %] 95 % (06/22 0700) Weight:  [119 lb 0.8 oz (54 kg)] 119 lb 0.8 oz (54 kg) (06/22 0500)  Hemodynamic parameters for last 24 hours:    Intake/Output from previous day: 06/21 0701 - 06/22 0700 In: 680 [I.V.:480; IV Piggyback:200] Out: 2030 [Urine:2030] Intake/Output this shift:    General appearance: alert, cooperative and no distress Neurologic: A&O x 3, no focal deficit Heart: regular rate and rhythm Lungs: diminished in bases but better than yesterday Abdomen: normal findings: soft, non-tender Wound: dressing intact  Lab Results:  Recent Labs  06/30/15 0500 07/01/15 0424  WBC 17.2* 15.0*  HGB 10.6* 10.4*  HCT 31.6* 30.5*  PLT 147* 146*   BMET:  Recent Labs  06/30/15 0500 07/01/15 0424  NA 127* 126*  K 3.5 2.8*  CL 94* 93*  CO2 25 26  GLUCOSE 135* 81  BUN 13 15  CREATININE 0.49 0.39*  CALCIUM 7.2* 7.2*    PT/INR:  Recent Labs  06/28/15 1500  LABPROT 17.1*  INR 1.38   ABG    Component Value Date/Time   PHART 7.405 06/28/2015 2236   HCO3 21.4 06/28/2015 2236   TCO2 24 06/29/2015 1749   ACIDBASEDEF 3.0* 06/28/2015 2236   O2SAT 98.0 06/28/2015 2236   CBG (last 3)   Recent Labs  06/30/15 1909 06/30/15 2339 07/01/15 0337  GLUCAP 144* 124* 84    Assessment/Plan: S/P Procedure(s) (LRB): REPLACEMENT ASCENDING AORTA AND ARCH (N/A) TRANSESOPHAGEAL ECHOCARDIOGRAM (TEE) (N/A) -  CV- stable, in SR, BP OK  QTc  Still > 500 off amiodarone and on beta blocker  RESP- pleural  effusions look a little better on CXR  RENAL- creatinine OK  Hypokalemia- replete K  Hyponatremia- hold off on diuresis today  ENDO- CBG Ok  Advance diet  Continue ambulation  SCD + enoxaparin for DVT prophylaxis     LOS: 3 days    Loreli SlotSteven C Kairah Leoni 07/01/2015

## 2015-07-01 NOTE — Progress Notes (Addendum)
Hypoglycemic Event  CBG: 67  Treatment: 120ml of apple juice  Symptoms: none   Follow-up CBG: Time: 0914 CBG Result: 62  Possible Reasons for Event: pt not eating and on levemir and not diabetic   Comments/MD notified: will give more apple juice and recheck blood sugar     Curly ShoresFowler, Gita Dilger E

## 2015-07-02 ENCOUNTER — Inpatient Hospital Stay (HOSPITAL_COMMUNITY): Payer: Medicare Other

## 2015-07-02 LAB — TYPE AND SCREEN
ABO/RH(D): AB POS
ANTIBODY SCREEN: NEGATIVE
UNIT DIVISION: 0
UNIT DIVISION: 0
UNIT DIVISION: 0
Unit division: 0

## 2015-07-02 LAB — CBC
HEMATOCRIT: 30.8 % — AB (ref 36.0–46.0)
HEMOGLOBIN: 10.4 g/dL — AB (ref 12.0–15.0)
MCH: 29.6 pg (ref 26.0–34.0)
MCHC: 33.8 g/dL (ref 30.0–36.0)
MCV: 87.7 fL (ref 78.0–100.0)
Platelets: 164 10*3/uL (ref 150–400)
RBC: 3.51 MIL/uL — ABNORMAL LOW (ref 3.87–5.11)
RDW: 12.7 % (ref 11.5–15.5)
WBC: 9.1 10*3/uL (ref 4.0–10.5)

## 2015-07-02 LAB — BASIC METABOLIC PANEL
ANION GAP: 7 (ref 5–15)
BUN: 13 mg/dL (ref 6–20)
CALCIUM: 7.8 mg/dL — AB (ref 8.9–10.3)
CHLORIDE: 100 mmol/L — AB (ref 101–111)
CO2: 25 mmol/L (ref 22–32)
CREATININE: 0.48 mg/dL (ref 0.44–1.00)
GFR calc non Af Amer: >60 mL/min (ref >60)
Glucose, Bld: 80 mg/dL (ref 65–99)
Potassium: 3.1 mmol/L — ABNORMAL LOW (ref 3.5–5.1)
SODIUM: 132 mmol/L — AB (ref 135–145)

## 2015-07-02 LAB — GLUCOSE, CAPILLARY: Glucose-Capillary: 82 mg/dL (ref 65–99)

## 2015-07-02 MED ORDER — SODIUM CHLORIDE 0.9% FLUSH: 3.0000 mL | INTRAVENOUS | Status: DC | PRN

## 2015-07-02 MED ORDER — SODIUM CHLORIDE 0.9% FLUSH
3.0000 mL | Freq: Two times a day (BID) | INTRAVENOUS | Status: DC
Start: 2015-07-02 — End: 2015-07-05
  Administered 2015-07-02 – 2015-07-04 (×5): 3 mL via INTRAVENOUS

## 2015-07-02 MED ORDER — MAGNESIUM HYDROXIDE 400 MG/5ML PO SUSP: 30.0000 mL | Freq: Every day | ORAL | Status: DC | PRN

## 2015-07-02 MED ORDER — SODIUM CHLORIDE 0.9 % IV SOLN: 250.0000 mL | INTRAVENOUS | Status: DC | PRN

## 2015-07-02 MED ORDER — POTASSIUM CHLORIDE CRYS ER 20 MEQ PO TBCR
20.0000 meq | EXTENDED_RELEASE_TABLET | Freq: Two times a day (BID) | ORAL | Status: DC
  Administered 2015-07-02 (×2): 20 meq via ORAL
  Filled 2015-07-02 (×2): qty 1

## 2015-07-02 MED ORDER — ALUM & MAG HYDROXIDE-SIMETH 200-200-20 MG/5ML PO SUSP: 15.0000 mL | ORAL | Status: DC | PRN

## 2015-07-02 MED ORDER — MOVING RIGHT ALONG BOOK
Freq: Once | Status: AC
  Administered 2015-07-02: 13:00:00
  Filled 2015-07-02: qty 1

## 2015-07-02 NOTE — Progress Notes (Signed)
4 Days Post-Op Procedure(s) (LRB): REPLACEMENT ASCENDING AORTA AND ARCH (N/A) TRANSESOPHAGEAL ECHOCARDIOGRAM (TEE) (N/A) Subjective: Feels well this AM  Objective: Vital signs in last 24 hours: Temp:  [98 F (36.7 C)-98.7 F (37.1 C)] 98.6 F (37 C) (06/22 2343) Pulse Rate:  [65-83] 75 (06/23 0700) Cardiac Rhythm:  [-] Normal sinus rhythm (06/23 0400) Resp:  [14-23] 17 (06/23 0700) BP: (87-165)/(55-91) 101/64 mmHg (06/23 0600) SpO2:  [89 %-100 %] 95 % (06/23 0700) Weight:  [116 lb 6.5 oz (52.8 kg)] 116 lb 6.5 oz (52.8 kg) (06/23 0500)  Hemodynamic parameters for last 24 hours:    Intake/Output from previous day: 06/22 0701 - 06/23 0700 In: 776 [P.O.:300; I.V.:276; IV Piggyback:200] Out: 1400 [Urine:1400] Intake/Output this shift:    General appearance: alert, cooperative and no distress Neurologic: intact Heart: regular rate and rhythm Lungs: diminished breath sounds bibasilar Abdomen: normal findings: soft, non-tender Wound: clean and dry  Lab Results:  Recent Labs  07/01/15 0424 07/01/15 1542 07/02/15 0505  WBC 15.0*  --  9.1  HGB 10.4* 9.5* 10.4*  HCT 30.5* 28.0* 30.8*  PLT 146*  --  164   BMET:  Recent Labs  07/01/15 0424 07/01/15 1542 07/02/15 0505  NA 126* 131* 132*  K 2.8* 3.8 3.1*  CL 93* 94* 100*  CO2 26  --  25  GLUCOSE 81 65 80  BUN 15 14 13   CREATININE 0.39* 0.30* 0.48  CALCIUM 7.2*  --  7.8*    PT/INR: No results for input(s): LABPROT, INR in the last 72 hours. ABG    Component Value Date/Time   PHART 7.405 06/28/2015 2236   HCO3 21.4 06/28/2015 2236   TCO2 20 07/01/2015 1542   ACIDBASEDEF 3.0* 06/28/2015 2236   O2SAT 98.0 06/28/2015 2236   CBG (last 3)   Recent Labs  07/01/15 1209 07/01/15 1530 07/01/15 2206  GLUCAP 74 77 82    Assessment/Plan: S/P Procedure(s) (LRB): REPLACEMENT ASCENDING AORTA AND ARCH (N/A) TRANSESOPHAGEAL ECHOCARDIOGRAM (TEE) (N/A) Plan for transfer to step-down: see transfer orders  CV-  maintaining SR, BP OK  RESP- bibasilar atelectasis, effusions- continue IS, wean O2 as tolerated  RENAL- creatinine OK, hypokalemia persists- replete K  Hyponatremia improving  ENDO- CBG normal  DVT prophylaxis- SCD + enoxaparin  Continue ambulation   LOS: 4 days    Loreli SlotSteven C Alik Mawson 07/02/2015

## 2015-07-02 NOTE — Evaluation (Signed)
Physical Therapy Evaluation Patient Details Name: Kristi Erickson Grieshaber MRN: [id number] DOB: 03/22/1940 Today'Erickson Date: 07/02/2015   History of Present Illness  Pt admit for REPLACEMENT ASCENDING AORTA AND ARCH .  Clinical Impression  Pt admitted with above diagnosis. Pt currently with functional limitations due to the deficits listed below (see PT Problem List). Pt was able to ambulate in hallway with RW with min to mod assist and cues for postural stability. Will benefit from SNF for therapy.   Pt will benefit from skilled PT to increase their independence and safety with mobility to allow discharge to the venue listed below.      Follow Up Recommendations SNF;Supervision/Assistance - 24 hour    Equipment Recommendations  3in1 (PT)    Recommendations for Other Services       Precautions / Restrictions Precautions Precautions: Fall;Sternal Restrictions Weight Bearing Restrictions: Yes (sternal precautions)      Mobility  Bed Mobility Overal bed mobility: Needs Assistance Bed Mobility: Rolling;Sidelying to Sit Rolling: Min assist Sidelying to sit: Min assist       General bed mobility comments: Cues for sternal precautions and assist for elevation of trunk. Cues to hold pillow.  Transfers Overall transfer level: Needs assistance Equipment used: Rolling walker (2 wheeled) Transfers: Sit to/from Stand Sit to Stand: Min assist         General transfer comment: Assist to power up and stabilize initially upon standing.   Ambulation/Gait Ambulation/Gait assistance: Min assist Ambulation Distance (Feet): 200 Feet Assistive device: Rolling walker (2 wheeled) Gait Pattern/deviations: Step-through pattern;Decreased stride length;Trunk flexed;Drifts right/left   Gait velocity interpretation: Below normal speed for age/gender General Gait Details: Pt was able to ambulate with RW with cues to sequence steps and RW as well as cues to stay close to RW.  Pt also needs cues to stand  up as she tends to stay in flexed posture.    Stairs            Wheelchair Mobility    Modified Rankin (Stroke Patients Only)       Balance Overall balance assessment: Needs assistance Sitting-balance support: No upper extremity supported;Feet supported Sitting balance-Leahy Scale: Fair     Standing balance support: Bilateral upper extremity supported;During functional activity Standing balance-Leahy Scale: Poor Standing balance comment: relies on UEs for support in standing                             Pertinent Vitals/Pain Pain Assessment: No/denies pain  VSS on RA.      Home Living Family/patient expects to be discharged to:: Skilled nursing facility Living Arrangements: Alone   Type of Home: House Home Access: Level entry       Home Equipment: Cane - single point;Walker - 4 wheels      Prior Function Level of Independence: Independent               Hand Dominance        Extremity/Trunk Assessment   Upper Extremity Assessment: Defer to OT evaluation           Lower Extremity Assessment: Generalized weakness      Cervical / Trunk Assessment: Kyphotic  Communication   Communication: No difficulties  Cognition Arousal/Alertness: Awake/alert Behavior During Therapy: WFL for tasks assessed/performed Overall Cognitive Status: Within Functional Limits for tasks assessed                      General Comments  Exercises        Assessment/Plan    PT Assessment Patient needs continued PT services  PT Diagnosis Generalized weakness   PT Problem List Decreased activity tolerance;Decreased balance;Decreased mobility;Decreased knowledge of use of DME;Decreased safety awareness;Decreased knowledge of precautions  PT Treatment Interventions DME instruction;Gait training;Functional mobility training;Therapeutic activities;Therapeutic exercise;Balance training;Patient/family education   PT Goals (Current goals can be  found in the Care Plan section) Acute Rehab PT Goals Patient Stated Goal: to get better PT Goal Formulation: With patient Time For Goal Achievement: 07/16/15 Potential to Achieve Goals: Good    Frequency Min 3X/week   Barriers to discharge Decreased caregiver support      Co-evaluation               End of Session Equipment Utilized During Treatment: Gait belt Activity Tolerance: Patient limited by fatigue Patient left: in bed;with call bell/phone within reach;with bed alarm set Nurse Communication: Mobility status         Time: [id number] PT Time Calculation (min) (ACUTE ONLY): 15 min   Charges:   PT Evaluation $PT Eval Moderate Complexity: 1 Procedure     PT G CodesBerline Lopes:        Javelle Donigan F 07/02/2015, 4:44 PM [facility],PT Acute Rehabilitation [phone] (pager)

## 2015-07-02 NOTE — Care Management Important Message (Signed)
Important Message  Patient Details  Name: Kristi Erickson MRN: [id number] Date of Birth: 1940-05-02   Medicare Important Message Given:  Yes    Kyla BalzarineShealy, Tela Kotecki Abena 07/02/2015, 10:26 AM

## 2015-07-02 NOTE — Progress Notes (Signed)
Patient arrived to 2W via wheelchair in no apparent distress.  Patient was oriented to room to include call light and phone.  Telemetry monitor was applied and CCMD was notified.  Will continue to monitor.

## 2015-07-02 NOTE — Progress Notes (Signed)
CARDIAC REHAB PHASE I   Pt recently transferred from ICU. Per chart, pt needs PT eval for possible SNF. Spoke with PT, they will see her this afternoon. Will follow-up tomorrow for additional ambulation.   Joylene GrapesEmily C Levina Boyack, RN, BSN 07/02/2015 2:37 PM

## 2015-07-02 NOTE — Care Management Note (Signed)
Case Management Note  Patient Details  Name: Kristi Erickson MRN: [id number] Date of Birth: 1940-11-09  Subjective/Objective:     Met with pt and brother @ bedside.  Brother states that pt will have no family to assist when she is stable for discharge, requests ST-SNF for rehab.  Provided brother with copy of facilities that are contracted with pt's insurance, he will choose facility when pt receives bed offers.                          Expected Discharge Plan:  [place]  In-House Referral:  Clinical Social Work  Discharge planning Services  CM Consult  Status of Service:  Completed, signed off  Girard Cooter, [place] 07/02/2015, 9:01 AM

## 2015-07-03 ENCOUNTER — Inpatient Hospital Stay (HOSPITAL_COMMUNITY): Payer: Medicare Other

## 2015-07-03 LAB — BASIC METABOLIC PANEL
ANION GAP: 8 (ref 5–15)
BUN: 15 mg/dL (ref 6–20)
CHLORIDE: 106 mmol/L (ref 101–111)
CO2: 22 mmol/L (ref 22–32)
Calcium: 8.6 mg/dL — ABNORMAL LOW (ref 8.9–10.3)
Creatinine, Ser: 0.54 mg/dL (ref 0.44–1.00)
GFR calc non Af Amer: >60 mL/min (ref >60)
Glucose, Bld: 102 mg/dL — ABNORMAL HIGH (ref 65–99)
Potassium: 3.5 mmol/L (ref 3.5–5.1)
Sodium: 136 mmol/L (ref 135–145)

## 2015-07-03 LAB — CBC
HEMATOCRIT: 32.6 % — AB (ref 36.0–46.0)
HEMOGLOBIN: 11.1 g/dL — AB (ref 12.0–15.0)
MCH: 30.2 pg (ref 26.0–34.0)
MCHC: 34 g/dL (ref 30.0–36.0)
MCV: 88.8 fL (ref 78.0–100.0)
Platelets: 185 10*3/uL (ref 150–400)
RBC: 3.67 MIL/uL — ABNORMAL LOW (ref 3.87–5.11)
RDW: 12.9 % (ref 11.5–15.5)
WBC: 8.9 10*3/uL (ref 4.0–10.5)

## 2015-07-03 MED ORDER — POTASSIUM CHLORIDE CRYS ER 20 MEQ PO TBCR
40.0000 meq | EXTENDED_RELEASE_TABLET | Freq: Once | ORAL | Status: AC
  Administered 2015-07-03: 40 meq via ORAL
  Filled 2015-07-03: qty 2

## 2015-07-03 MED ORDER — POTASSIUM CHLORIDE CRYS ER 20 MEQ PO TBCR
20.0000 meq | EXTENDED_RELEASE_TABLET | Freq: Every day | ORAL | Status: AC
  Administered 2015-07-03: 20 meq via ORAL
  Filled 2015-07-03: qty 1

## 2015-07-03 MED ORDER — LACTULOSE 10 GM/15ML PO SOLN
20.0000 g | Freq: Once | ORAL | Status: AC
  Administered 2015-07-03: 20 g via ORAL
  Filled 2015-07-03: qty 30

## 2015-07-03 MED ORDER — LISINOPRIL 5 MG PO TABS
5.0000 mg | ORAL_TABLET | Freq: Every day | ORAL | Status: DC
  Administered 2015-07-03: 5 mg via ORAL
  Filled 2015-07-03: qty 1

## 2015-07-03 NOTE — Progress Notes (Addendum)
      [address]       [place] [id number]             [phone]        5 Days Post-Op Procedure(s) (LRB): REPLACEMENT ASCENDING AORTA AND ARCH (N/A) TRANSESOPHAGEAL ECHOCARDIOGRAM (TEE) (N/A)  Subjective: Patient with complaints about diet as she has diverticular disease.  Objective: Vital signs in last 24 hours: Temp:  [97.2 F (36.2 C)-98.1 F (36.7 C)] 98.1 F (36.7 C) (06/24 0353) Pulse Rate:  [67-81] 70 (06/24 0353) Cardiac Rhythm:  [-] Normal sinus rhythm (06/24 0703) Resp:  [17-22] 18 (06/24 0353) BP: (112-160)/(65-77) 160/71 mmHg (06/24 0353) SpO2:  [93 %-100 %] 97 % (06/24 0353) Weight:  [114 lb 1.6 oz (51.755 kg)] 114 lb 1.6 oz (51.755 kg) (06/24 0353)  Pre op weight 51 kg Current Weight  07/03/15 114 lb 1.6 oz (51.755 kg)       Intake/Output from previous day: 06/23 0701 - 06/24 0700 In: 240 [P.O.:240] Out: 100 [Urine:100]   Physical Exam:  Cardiovascular: RRR Pulmonary: Slightly diminished at bases Abdomen: Soft, non tender, bowel sounds present. Extremities: Trace bilateral lower extremity edema. Wounds: Clean and dry.  No erythema or signs of infection.  Lab Results: CBC: Recent Labs  07/02/15 0505 07/03/15 0338  WBC 9.1 8.9  HGB 10.4* 11.1*  HCT 30.8* 32.6*  PLT 164 185   BMET:  Recent Labs  07/02/15 0505 07/03/15 0338  NA 132* 136  K 3.1* 3.5  CL 100* 106  CO2 25 22  GLUCOSE 80 102*  BUN 13 15  CREATININE 0.48 0.54  CALCIUM 7.8* 8.6*    PT/INR:  Lab Results  Component Value Date   INR 1.38 06/28/2015   INR 1.06 06/24/2015   INR 0.97 06/01/2015   ABG:  INR: Will add last result for INR, ABG once components are confirmed Will add last 4 CBG results once components are confirmed  Assessment/Plan:  1. CV - SR in the 70's. On Lopressor 25 mg bid. Will restart low dose Quinapril for better BP control. 2.  Pulmonary - On room air. CXR this am appears to show no pneumothorax, bilateral pleural  effusions, widening of mediastinum (s/p aneurysm repair). Encourage incentive spirometer. 3.  Acute blood loss anemia - H and H stable at 11.1 and 32.6 4. Supplement potassium 5. Will need SNF when ready for discharge-likely Monday  ZIMMERMAN,DONIELLE MPA-C 07/03/2015,7:39 AM  Improving, mental status better , less confused  Abdomen soft not tender with bowel sounds  I have seen and examined Kristi Erickson and agree with the above assessment  and plan.  Delight OvensEdward B Quintavious Rinck MD [facility] [phone] Office [phone] 07/03/2015 9:10 AM

## 2015-07-03 NOTE — Progress Notes (Signed)
Pacing wires have been removed per protocol. Wires were intact upon removal. Pt tolerated well. Pt's vitals are stable. Pt's vitals are being monitored every 15 minutes, per protocol. Pt is resting in bed at this time with call light within reach. Will continue to monitor.   Berdine DanceLauren Moffitt BSN, RN

## 2015-07-03 NOTE — Progress Notes (Signed)
CARDIAC REHAB PHASE I   PRE:  Rate/Rhythm: 73  BP:   Sitting: 112/60     SaO2: 95% RA  MODE:  Ambulation: 14850ft ft   POST:  Rate/Rhythm: 80  BP:   Sitting: 124/72     SaO2: 98% RA 250-331  Pt ambulated 15950ft with one person assist and rolling walker. Pt tolerated walk well and without any signs of CP, dizziness or lightheadedness. Returned pt to side of bed in which bed mobility and sit to standswere performed without the use of arms. Pt has call bell and phone in reach. [facility], gave brochure and contact information. Needs to be reinforced.   Lailah Marcelli D Suriya Kovarik,MS,ACSM-RCEP 07/03/2015 3:31 PM

## 2015-07-04 MED ORDER — LISINOPRIL 10 MG PO TABS
10.0000 mg | ORAL_TABLET | Freq: Every day | ORAL | Status: DC
  Administered 2015-07-04: 10 mg via ORAL
  Filled 2015-07-04: qty 1

## 2015-07-04 NOTE — Discharge Instructions (Signed)
We ask the SNF to please do the following: 1. Please obtain vital signs at least one time daily 2.Please weigh the patient daily. If he or she continues to gain weight or develops lower extremity edema, contact the office at [phone]. 3. Ambulate patient at least three times daily and please use sternal precautions.  Activity: 1.May walk up steps                2.No lifting more than ten pounds for four weeks.                 3.No driving for four weeks.                4.Stop any activity that causes chest pain, shortness of breath, dizziness,  sweating or excessive weakness.                5.Avoid straining.                6.Continue with your breathing exercises daily.  Diet: Low fat, Low salt diet  Wound Care: May shower.  Clean wounds with mild soap and water daily. Contact the office at [phone] if any problems arise.

## 2015-07-04 NOTE — Progress Notes (Addendum)
      [address]       [place] [id number]             [phone]        6 Days Post-Op Procedure(s) (LRB): REPLACEMENT ASCENDING AORTA AND ARCH (N/A) TRANSESOPHAGEAL ECHOCARDIOGRAM (TEE) (N/A)  Subjective: Patient asleep but awakened. She states she had best night sleep in awhile.  Objective: Vital signs in last 24 hours: Temp:  [97.8 F (36.6 C)-98.1 F (36.7 C)] 97.8 F (36.6 C) (06/25 0442) Pulse Rate:  [43-85] 66 (06/25 0442) Cardiac Rhythm:  [-] Normal sinus rhythm (06/25 0706) Resp:  [18-24] 18 (06/25 0442) BP: (101-150)/(45-73) 150/66 mmHg (06/25 0442) SpO2:  [95 %-97 %] 95 % (06/25 0442) Weight:  [116 lb 10 oz (52.9 kg)] 116 lb 10 oz (52.9 kg) (06/25 0442)  Pre op weight 51 kg Current Weight  07/04/15 116 lb 10 oz (52.9 kg)      Intake/Output from previous day: 06/24 0701 - 06/25 0700 In: 600 [P.O.:600] Out: -    Physical Exam:  Cardiovascular: RRR Pulmonary: Slightly diminished at bases Abdomen: Soft, non tender, bowel sounds present. Extremities: No lower extremity edema. Wounds: Clean and dry.  No erythema or signs of infection.  Lab Results: CBC:  Recent Labs  07/02/15 0505 07/03/15 0338  WBC 9.1 8.9  HGB 10.4* 11.1*  HCT 30.8* 32.6*  PLT 164 185   BMET:   Recent Labs  07/02/15 0505 07/03/15 0338  NA 132* 136  K 3.1* 3.5  CL 100* 106  CO2 25 22  GLUCOSE 80 102*  BUN 13 15  CREATININE 0.48 0.54  CALCIUM 7.8* 8.6*    PT/INR:  Lab Results  Component Value Date   INR 1.38 06/28/2015   INR 1.06 06/24/2015   INR 0.97 06/01/2015   ABG:  INR: Will add last result for INR, ABG once components are confirmed Will add last 4 CBG results once components are confirmed  Assessment/Plan:  1. CV - SR in the 70's. On Lopressor 25 mg bid and Quinapril 5 mg daily. Will increase Quinapril for better BP control. 2.  Pulmonary - On room air. CXR this am appears to show no pneumothorax, bilateral pleural effusions,  widening of mediastinum (s/p aneurysm repair). Encourage incentive spirometer. 3.  Acute blood loss anemia - H and H stable at 11.1 and 32.6 4. Will need SNF when ready for discharge-possibly in am  ZIMMERMAN,DONIELLE MPA-C 07/04/2015,7:57 AM  Proceed with plans for SNF, [facility]  I have seen and examined Beacher MayEleanor S Marchuk and agree with the above assessment  and plan.  Delight OvensEdward B Gisele Pack MD Beeper [phone] Office [phone] 07/04/2015 9:20 AM

## 2015-07-04 NOTE — Discharge Summary (Signed)
Physician Discharge Summary       [address] [place]       [place] [address]             [phone]    Patient ID: Kristi Erickson MRN: [id number] DOB/AGE: 01/31/1940 75 y.o.  Admit date: 06/28/2015 Discharge date: 07/05/2015  Admission Diagnoses: Ascending and arch aneursym  Active Diagnoses:  1. GERD (gastroesophageal reflux disease) 2. Hypertension 3. Hyperlipidemia 4. Turner's syndrome 5. Vitamin D deficiency 6. Osteoporosis 7. Vision disorder 8. Tobacco abuse  Procedure (s):  Repair of ascending and arch aneurysm with side-arm graft to the innominate and left common carotid, Resuspension of aortic valve under deep hypothermic circulatory arrest by Dr. Dorris Fetch on 06/28/2015.  History of Presenting Illness:  She is a 75 year old woman with Turner's syndrome, horseshoe kidney, glaucoma, hypertension, hyperlipidemia, osteoporosis, gastroesophageal reflux, diverticulitis, and aortic insufficiency. She had an echocardiogram in 2013 which showed moderate aortic insufficiency and preserved left ventricular function. She recently had a repeat echocardiogram. It showed mild-to-moderate aortic insufficiency, normal left ventricular function, and mild-to-moderate aortic insufficiency. The ascending aorta was noted to be dilated. 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 There was marked dilatation of the origin of the innominate artery as well. There was no evidence of dissection.  She is 4 feet 10 inches tall and weighs 114 pounds. She complains of shortness of breath with exertion after walking about a block or a flight of stairs. She says that she could get up 2 flights of stairs if she had to. She does recover within a minute or so of resting. She denies any chest pain, pressure, or tightness associated with shortness of breath. She has not had any presyncopal  or syncopal spells. She is not aware of any family history of aneurysms.  After Dr. Dorris Fetch saw her on 05/19/2015, she wanted a second opinion. She saw Dr. Tyrone Sage who agreed that she needed surgical repair.  She had cardiac catheterization. There was a 90% stenosis in a tiny branch of the first diagonal. There was no other significant coronary disease.  Her symptoms have not changed since her last visit. Dr. Dorris Fetch recommended again that we proceed with repair of the aneurysm. He also discussed management aortic valve. She does have some aortic insufficiency. Dr. Dorris Fetch suspects this is secondary to the aneurysm and that grafting the aneurysm well fixed aortic insufficiency. They understand that aortic valve repair or replacement could be necessary depending on intraoperative findings. If an aortic valve replacement is necessary a tissue valve would be used. We again discussed the indications, risks, benefits, and alternatives. She understands and agreed to proceed with surery. Pre operative carotid duplex US showed no significant internal carotid artery stenosis bilaterally. She underwent repair of ascending and arch aneurysm and re suspension of the aortic valve on 06/28/2015.  Brief Hospital Course:  The patient was extubated the evening of surgery without difficulty. She remained afebrile and hemodynamically stable. She was put on Amiodarone for intra op a fib. Theone Murdoch, a line, chest tubes, and foley were removed early in the post operative course. Lopressor was started and titrated accordingly. She was volume over loaded and diuresed. She had ABL anemia. She did require a post op transfusion. Her last H and H was up to 11.1 and 32.6. She was weaned off the insulin drip.The patient's HGA1C pre op was 5.5. She did  have some post op confusion but this did resolve. The patient was felt surgically stable for transfer from the ICU to [facility] for further convalescence on 07/02/2015. She  continues to progress with cardiac rehab. She was ambulating on room air. She has been tolerating a diet and has had a bowel movement. Epicardial pacing wires have been removed. Chest tube sutures will be removed prior to discharge. The patient is felt surgically stable for discharge to SNF today.   We ask the SNF to please do the following: 1. Please obtain vital signs at least one time daily 2.Please weigh the patient daily. If he or she continues to gain weight or develops lower extremity edema, contact the office at [phone]. 3. Ambulate patient at least three times daily and please use sternal precautions.  Latest Vital Signs: Blood pressure 122/67, pulse 85, temperature 97.7 F (36.5 C), temperature source Oral, resp. rate 16, height 4\' 10"  (1.473 m), weight 112 lb 10.5 oz (51.1 kg), SpO2 95 %.  Physical Exam: Cardiovascular: RRR Pulmonary: Slightly diminished at bases Abdomen: Soft, non tender, bowel sounds present. Extremities: No lower extremity edema. Wounds: Clean and dry. No erythema or signs of infection.  Discharge Condition:Stable and discharged to SNF.  Recent laboratory studies:  Lab Results  Component Value Date   WBC 16.1* 07/05/2015   HGB 11.8* 07/05/2015   HCT 35.3* 07/05/2015   MCV 91.7 07/05/2015   PLT 263 07/05/2015   Lab Results  Component Value Date   NA 136 07/05/2015   K 3.2* 07/05/2015   CL 103 07/05/2015   CO2 25 07/05/2015   CREATININE 0.52 07/05/2015   GLUCOSE 115* 07/05/2015    Diagnostic Studies: Dg Chest 2 View  07/03/2015  CLINICAL DATA:  Surgery for aortic aneurysm last Monday. Now with complaint of some congestion and productive cough as well is mild chest pain and shortness of breath with exertion. EXAM: CHEST  2 VIEW COMPARISON:  Chest x-rays dated 07/02/2015 in 06/2020 2017. FINDINGS: Continued improvement in aeration bilaterally. No evidence of significant edema on today's exam. Small pleural effusions are again seen  bilaterally, with adjacent mild bibasilar atelectasis. Cardiomediastinal silhouette is stable in size and configuration. Right IJ sheath has been removed in the interval. Atherosclerotic changes are seen at the aortic arch. IMPRESSION: 1. Continued improvement in aeration bilaterally, suggesting improved fluid status. 2. Small bilateral pleural effusions, with probable mild bibasilar atelectasis. No evidence of pneumonia. 3. Aortic atherosclerosis. Electronically Signed   By: Bary RichardStan  Maynard M.D.   On: 07/03/2015 10:56        Discharge Instructions    Amb Referral to Cardiac Rehabilitation    Complete by:  As directed   Diagnosis:  Other Comment - replacement of ascending aorta and arch          Discharge Medications:   Medication List    STOP taking these medications        acetaminophen 500 MG tablet  Commonly known as:  TYLENOL     diltiazem 180 MG 24 hr capsule  Commonly known as:  DILACOR XR     quinapril 40 MG tablet  Commonly known as:  ACCUPRIL      TAKE these medications        aspirin 325 MG EC tablet  Take 1 tablet (325 mg total) by mouth daily.     calcium-vitamin D 500-200 MG-UNIT tablet  Commonly known as:  OSCAL WITH D  Take 1 tablet by mouth daily with breakfast.  dicyclomine 20 MG tablet  Commonly known as:  BENTYL  Take 20 mg by mouth 2 (two) times daily.     dorzolamide 2 % ophthalmic solution  Commonly known as:  TRUSOPT  Place 1 drop into both eyes 2 (two) times daily.     FOSAMAX 70 MG tablet  Generic drug:  alendronate  Take 70 mg by mouth every Tuesday.     levothyroxine 88 MCG tablet  Commonly known as:  SYNTHROID, LEVOTHROID  Take 88 mcg by mouth every morning.     lisinopril 20 MG tablet  Commonly known as:  PRINIVIL,ZESTRIL  Take 1 tablet (20 mg total) by mouth daily.     metoprolol tartrate 25 MG tablet  Commonly known as:  LOPRESSOR  Take 1 tablet (25 mg total) by mouth 2 (two) times daily.     mirtazapine 45 MG tablet    Commonly known as:  REMERON  Take 45 mg by mouth at bedtime.     multivitamin tablet  Take 1 tablet by mouth daily.     oxyCODONE 5 MG immediate release tablet  Commonly known as:  Oxy IR/ROXICODONE  Take 1-2 tablets (5-10 mg total) by mouth every 6 (six) hours as needed for severe pain.     simvastatin 40 MG tablet  Commonly known as:  ZOCOR  Take 40 mg by mouth every morning.     temazepam 15 MG capsule  Commonly known as:  RESTORIL  Take 15 mg by mouth at bedtime.     vitamin C 1000 MG tablet  Take 3,000 mg by mouth daily.     Vitamin D 2000 units Caps  Take 2,000 Units by mouth daily.       The patient has been discharged on:   1.Beta Blocker:  Yes [ x  ]                              No   [   ]                              If No, reason:  2.Ace Inhibitor/ARB: Yes [ x  ]                                     No  [    ]                                     If No, reason:  3.Statin:   Yes [ x  ]                  No  [   ]                  If No, reason:  4.Ecasa:  Yes  [  x ]                  No   [   ]                  If No, reason: Follow Up Appointments:  Follow-up Information    Follow up with [facility] .   Specialty:  Skilled Nursing Facility  Contact information:   [address] [place] [id number] [phone]      Follow up with Chrystie NoseKenneth C Hilty, MD.   Specialty:  Cardiology   Why:  Call for a follow up appointment for 2 weeks   Contact information:   [address] [place] [place] [address] [phone]       Follow up with Loreli SlotSteven C Vibhav Waddill, MD On 07/07/2015.   Specialty:  Cardiothoracic Surgery   Why:  PA/LAT CXR to be taken ([facility] which is in the same building as Dr. Sunday CornHendrickson's office) one hour prior to office appointment;Office will mail appointment date and time   Contact information:   [address] [place] [address] [phone]       Follow up On  07/07/2015.      SignedGershon Crane: GOLD,WAYNE EPA-C 07/05/2015, 1:49 PM

## 2015-07-05 LAB — BASIC METABOLIC PANEL
Anion gap: 8 (ref 5–15)
BUN: 10 mg/dL (ref 6–20)
CO2: 25 mmol/L (ref 22–32)
Calcium: 8.7 mg/dL — ABNORMAL LOW (ref 8.9–10.3)
Chloride: 103 mmol/L (ref 101–111)
Creatinine, Ser: 0.52 mg/dL (ref 0.44–1.00)
GFR calc Af Amer: >60 mL/min (ref >60)
GFR calc non Af Amer: >60 mL/min (ref >60)
Glucose, Bld: 115 mg/dL — ABNORMAL HIGH (ref 65–99)
Potassium: 3.2 mmol/L — ABNORMAL LOW (ref 3.5–5.1)
Sodium: 136 mmol/L (ref 135–145)

## 2015-07-05 LAB — CBC
HCT: 35.3 % — ABNORMAL LOW (ref 36.0–46.0)
Hemoglobin: 11.8 g/dL — ABNORMAL LOW (ref 12.0–15.0)
MCH: 30.6 pg (ref 26.0–34.0)
MCHC: 33.4 g/dL (ref 30.0–36.0)
MCV: 91.7 fL (ref 78.0–100.0)
Platelets: 263 10*3/uL (ref 150–400)
RBC: 3.85 MIL/uL — ABNORMAL LOW (ref 3.87–5.11)
RDW: 13.5 % (ref 11.5–15.5)
WBC: 16.1 10*3/uL — ABNORMAL HIGH (ref 4.0–10.5)

## 2015-07-05 MED ORDER — ASPIRIN 325 MG PO TBEC: 325.0000 mg | DELAYED_RELEASE_TABLET | Freq: Every day | ORAL | Status: DC

## 2015-07-05 MED ORDER — OXYCODONE HCL 5 MG PO TABS: 5.0000 mg | ORAL_TABLET | Freq: Four times a day (QID) | ORAL | Status: DC | PRN

## 2015-07-05 MED ORDER — METOPROLOL TARTRATE 25 MG PO TABS: 25.0000 mg | ORAL_TABLET | Freq: Two times a day (BID) | ORAL | Status: DC

## 2015-07-05 MED ORDER — POTASSIUM CHLORIDE CRYS ER 20 MEQ PO TBCR
40.0000 meq | EXTENDED_RELEASE_TABLET | Freq: Once | ORAL | Status: AC
  Administered 2015-07-05: 40 meq via ORAL
  Filled 2015-07-05: qty 2

## 2015-07-05 MED ORDER — LISINOPRIL 10 MG PO TABS
20.0000 mg | ORAL_TABLET | Freq: Every day | ORAL | Status: DC
  Administered 2015-07-05: 20 mg via ORAL
  Filled 2015-07-05: qty 2

## 2015-07-05 MED ORDER — LISINOPRIL 20 MG PO TABS
20.0000 mg | ORAL_TABLET | Freq: Every day | ORAL | Status: AC
Start: 2015-07-05 — End: ?

## 2015-07-05 NOTE — Care Management Important Message (Signed)
Important Message  Patient Details  Name: Kristi Erickson MRN: [id number] Date of Birth: 1940-01-18   Medicare Important Message Given:  Yes    Kyla BalzarineShealy, Keiasia Christianson Abena 07/05/2015, 10:35 AM

## 2015-07-05 NOTE — Progress Notes (Signed)
Report called to RN at [facility]. All questions answered.  

## 2015-07-05 NOTE — Progress Notes (Signed)
OT Cancellation Note  Patient Details Name: Kristi Erickson MRN: [id number] DOB: 01-14-1940   Cancelled Treatment:    Reason Eval/Treat Not Completed: Other (comment). Pt's breakfast in room and pt asking to eat breakfast prior to therapy. Helped pt get setup for breakfast. Will check back as schedule allows.  Evette GeorgesLeonard, Samani Deal Eva [phone] 07/05/2015, 8:39 AM

## 2015-07-05 NOTE — Progress Notes (Signed)
Physical Therapy Treatment Patient Details Name: Beacher Mayleanor S Medellin MRN: [id number] DOB: 28-Nov-1940 Today's Date: 07/05/2015    History of Present Illness Pt admit for REPLACEMENT ASCENDING AORTA AND ARCH .    PT Comments    Patient progressing slowly with mobility, activity tolerance and safety.  Agree with SNF level rehab with possible plans to d/c today.  Will continue acute level PT until d/c.   Follow Up Recommendations  SNF;Supervision/Assistance - 24 hour     Equipment Recommendations  3in1 (PT)    Recommendations for Other Services       Precautions / Restrictions Precautions Precautions: Fall;Sternal Restrictions Weight Bearing Restrictions: Yes Other Position/Activity Restrictions: sternal    Mobility  Bed Mobility Overal bed mobility: Needs Assistance Bed Mobility: Sit to Sidelying         Sit to sidelying: Min assist General bed mobility comments: assist for technique for sternal precautions; cues to [facility] to [facility] LE's  Transfers Overall transfer level: Needs assistance Equipment used: Rolling walker (2 wheeled) Transfers: Sit to/from Stand Sit to Stand: Supervision         General transfer comment: able to stand with arms crossed over chest  Ambulation/Gait Ambulation/Gait assistance: Min guard Ambulation Distance (Feet): 350 Feet Assistive device: Rolling walker (2 wheeled) Gait Pattern/deviations: Step-through pattern;Trunk flexed     General Gait Details: minguard for safety due to flexed posture with increasing weight on UE's and reports arm fatigue   Stairs            Wheelchair Mobility    Modified Rankin (Stroke Patients Only)       Balance Overall balance assessment: Needs assistance Sitting-balance support: No upper extremity supported;Feet supported Sitting balance-Leahy Scale: Good       Standing balance-Leahy Scale: Fair Standing balance comment: static balance no UE support needed, dynamic she does need  UE support                    Cognition Arousal/Alertness: Awake/alert Behavior During Therapy: WFL for tasks assessed/performed Overall Cognitive Status: Within Functional Limits for tasks assessed                      Exercises      General Comments General comments (skin integrity, edema, etc.): HR 79, SpO2 97% on RA, BP 135/63 after ambulation with mild SOB noted      Pertinent Vitals/Pain Pain Assessment: No/denies pain    Home Living Family/patient expects to be discharged to:: Skilled nursing facility                    Prior Function Level of Independence: Independent          PT Goals (current goals can now be found in the care plan section) Acute Rehab PT Goals Patient Stated Goal: to go to rehab then home Progress towards PT goals: Progressing toward goals    Frequency  Min 3X/week    PT Plan Current plan remains appropriate    Co-evaluation             End of Session Equipment Utilized During Treatment: Gait belt Activity Tolerance: Patient limited by fatigue Patient left: in bed;with call bell/phone within reach     Time: 1349-1408 PT Time Calculation (min) (ACUTE ONLY): 19 min  Charges:  $Gait Training: 8-22 mins                    G Codes:  Elray McgregorCynthia Wynn 07/05/2015, 2:36 PM  Sheran Lawlessyndi Wynn, [place] [phone] 07/05/2015

## 2015-07-05 NOTE — Progress Notes (Addendum)
      [address]       [place] [id number]             [phone]      7 Days Post-Op Procedure(s) (LRB): REPLACEMENT ASCENDING AORTA AND ARCH (N/A) TRANSESOPHAGEAL ECHOCARDIOGRAM (TEE) (N/A) Subjective: Feels well  Objective: Vital signs in last 24 hours: Temp:  [97.4 F (36.3 C)-98 F (36.7 C)] 97.7 F (36.5 C) (06/26 0623) Pulse Rate:  [67-87] 72 (06/26 0623) Cardiac Rhythm:  [-] Normal sinus rhythm (06/25 1902) Resp:  [16-24] 16 (06/26 0623) BP: (98-141)/(57-74) 98/63 mmHg (06/26 0623) SpO2:  [95 %-97 %] 95 % (06/26 0623) Weight:  [112 lb 10.5 oz (51.1 kg)] 112 lb 10.5 oz (51.1 kg) (06/26 91470623)  Hemodynamic parameters for last 24 hours:    Intake/Output from previous day: 06/25 0701 - 06/26 0700 In: 1080 [P.O.:1080] Out: -  Intake/Output this shift:    General appearance: alert, cooperative and no distress Heart: regular rate and rhythm and no murmur Lungs: clear to auscultation bilaterally Abdomen: benign Extremities: no edema Wound: incis healing well  Lab Results:  Recent Labs  07/03/15 0338 07/05/15 0318  WBC 8.9 16.1*  HGB 11.1* 11.8*  HCT 32.6* 35.3*  PLT 185 263   BMET:  Recent Labs  07/03/15 0338 07/05/15 0318  NA 136 136  K 3.5 3.2*  CL 106 103  CO2 22 25  GLUCOSE 102* 115*  BUN 15 10  CREATININE 0.54 0.52  CALCIUM 8.6* 8.7*    PT/INR: No results for input(s): LABPROT, INR in the last 72 hours. ABG    Component Value Date/Time   PHART 7.405 06/28/2015 2236   HCO3 21.4 06/28/2015 2236   TCO2 20 07/01/2015 1542   ACIDBASEDEF 3.0* 06/28/2015 2236   O2SAT 98.0 06/28/2015 2236   CBG (last 3)   Recent Labs  07/02/15 0809  GLUCAP 82    Meds Scheduled Meds: . aspirin EC  325 mg Oral Daily   Or  . aspirin  324 mg Per Tube Daily  . bisacodyl  10 mg Oral Daily   Or  . bisacodyl  10 mg Rectal Daily  . dicyclomine  20 mg Oral BID  . dorzolamide  1 drop Both Eyes BID  . enoxaparin (LOVENOX) injection   30 mg Subcutaneous QHS  . levothyroxine  88 mcg Oral QAC breakfast  . lisinopril  10 mg Oral Daily  . metoprolol tartrate  25 mg Oral BID   Or  . metoprolol tartrate  25 mg Per Tube BID  . mirtazapine  45 mg Oral QHS  . pantoprazole  40 mg Oral Daily  . simvastatin  40 mg Oral Daily  . sodium chloride flush  3 mL Intravenous Q12H  . temazepam  15 mg Oral QHS   Continuous Infusions:  PRN Meds:.sodium chloride, alum & mag hydroxide-simeth, LORazepam, magnesium hydroxide, ondansetron (ZOFRAN) IV, oxyCODONE, sodium chloride flush, traMADol  Xrays No results found.  Assessment/Plan: S/P Procedure(s) (LRB): REPLACEMENT ASCENDING AORTA AND ARCH (N/A) TRANSESOPHAGEAL ECHOCARDIOGRAM (TEE) (N/A) 1 conts to do well 2 hemodyn stable in sinus rhythm, BP should tol a little higher dose of lisinopril 3 leukocytosis, with no fevers- unclear etiology- check UA 4 replace K+ 5 poss SNF today if bed avail  LOS: 7 days    GOLD,WAYNE E 07/05/2015  Patient seen and examined, [facility]  Cedar CrestSteven C. Dorris FetchHendrickson, MD [facility] [phone]

## 2015-07-05 NOTE — Progress Notes (Signed)
CARDIAC REHAB PHASE I   PRE:  Rate/Rhythm: 86 SR c/ PVCs  BP:  Sitting: 142/65        SaO2: 97 RA  MODE:  Ambulation: 200 ft   POST:  Rate/Rhythm: 93 SR c/ PVCs  BP:  Sitting: 157/69         SaO2: 97 RA  Pt states she walked only once this weekend (with cardiac rehab on Saturday). Pt ambulated 200 ft on RA, rolling walker, fairly steady gait, tolerated well. Pt c/o DOE (improved with rest), denies any other complaints. Cardiac surgery discharge education completed with pt at bedside. Reviewed risk factors, IS, sternal precautions, activity progression, exercise, heart healthy diet, and phase 2 cardiac rehab. Pt verbalized understanding, needs reinforcement. Pt agrees to phase 2 cardiac rehab referral, [facility]. Pt to recliner after walk, feet elevated, call bell within reach.   [phone] Joylene GrapesEmily C Takao Lizer, RN, BSN 07/05/2015 10:45 AM

## 2015-07-05 NOTE — Clinical Social Work Placement (Signed)
   CLINICAL SOCIAL WORK PLACEMENT  NOTE  Date:  07/05/2015  Patient Details  Name: Kristi Erickson MRN: [id number] Date of Birth: 1940-02-20  Clinical Social Work is seeking post-discharge placement for this patient at the   level of care (*CSW will initial, date and re-position this form in  chart as items are completed):  Yes   Patient/family provided with [facility] Clinical Social Work Department's list of facilities offering this level of care within the geographic area requested by the patient (or if unable, by the patient's family).  Yes   Patient/family informed of their freedom to choose among providers that offer the needed level of care, that participate in Medicare, Medicaid or managed care program needed by the patient, have an available bed and are willing to accept the patient.  Yes   Patient/family informed of [facility]'s ownership interest in [facility] and [facility], as well as of the fact that they are under no obligation to receive care at these facilities.  PASRR submitted to EDS on 07/01/15     PASRR number received on 07/01/15     Existing PASRR number confirmed on       FL2 transmitted to all facilities in geographic area requested by pt/family on 07/01/15     FL2 transmitted to all facilities within larger geographic area on       Patient informed that his/her managed care company has contracts with or will negotiate with certain facilities, including the following:        Yes   Patient/family informed of bed offers received.  [facility] bed [facility]     Physician recommends and patient chooses bed at      [facility] 07[facility].  Patient to be transferred to facility by Ambulance     Patient family notified on 07/05/15 of transfer.  Name of family member notified:  Demetrio Lappingom Bathe     PHYSICIAN Please prepare priority discharge summary, including medications, Please sign FL2, Please prepare  prescriptions     Additional Comment:  Per MD patient is [facility]. RN, patient, patient's family, and facility notified of discharge. RN given phone number for report and transport packet is on patient's chart. Ambulance transport requested. CSW signing off.   _______________________________________________ Reggy EyeLaShonda A Kaylenn Civil, LCSW 07/05/2015, 2:22 PM

## 2015-07-05 NOTE — Evaluation (Signed)
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   Clinical Impression   This 75 yo female admitted and underwent above presents to acute OT with deficits below (see OT problem list) thus affecting her PLOF of independent. She will benefit from acute OT with follow up OT at SNF to get back to her PLOF to return home.    Follow Up Recommendations  SNF    Equipment Recommendations  Other (comment) (TBD at next venue)       Precautions / Restrictions Precautions Precautions: Fall;Sternal Restrictions Weight Bearing Restrictions: Yes Other Position/Activity Restrictions: sternal      Mobility Bed Mobility               General bed mobility comments: Pt up in recliner upon arrival  Transfers Overall transfer level: Needs assistance Equipment used: Rolling walker (2 wheeled) Transfers: Sit to/from Stand Sit to Stand: Supervision         General transfer comment: Pt uses cardiac pillow to remind her not to use her arms for pushing up to stand    Balance Overall balance assessment: Needs assistance Sitting-balance support: No upper extremity supported;Feet supported Sitting balance-Leahy Scale: Good       Standing balance-Leahy Scale: Fair Standing balance comment: static balance no UE support needed, dynamic she does need UE support                            ADL Overall ADL's : Needs assistance/impaired Eating/Feeding: Independent;Sitting   Grooming: Min guard;Oral care;Wash/dry face;Wash/dry hands;Standing   Upper Body Bathing: Set up;Sitting   Lower Body Bathing: Min guard;Sit to/from stand   Upper Body Dressing : Set up;Sitting   Lower Body Dressing: Min guard;Sit to/from stand   Toilet Transfer: Min guard;Ambulation;RW;BSC   Toileting- ArchitectClothing Manipulation and Hygiene:  Min guard;Sit to/from stand                         Pertinent Vitals/Pain Pain Assessment: No/denies pain     Hand Dominance Right   Extremity/Trunk Assessment Upper Extremity Assessment Upper Extremity Assessment: Overall WFL for tasks assessed           Communication Communication Communication: No difficulties   Cognition Arousal/Alertness: Awake/alert Behavior During Therapy: WFL for tasks assessed/performed Overall Cognitive Status: Within Functional Limits for tasks assessed                                Home Living Family/patient expects to be discharged to:: Skilled nursing facility                                        Prior Functioning/Environment Level of Independence: Independent             OT Diagnosis: Generalized weakness   OT Problem List: Impaired balance (sitting and/or standing);Decreased knowledge of precautions;Decreased knowledge of use of DME or AE   OT Treatment/Interventions: Self-care/ADL training;Patient/family education;Balance training;Therapeutic activities;DME and/or AE instruction    OT Goals(Current goals can be found in the care plan section) Acute Rehab OT Goals Patient Stated Goal: to go to rehab then home OT Goal Formulation: With patient  Time For Goal Achievement: 07/12/15 Potential to Achieve Goals: Good  OT Frequency: Min 2X/week   Barriers to D/C: Decreased caregiver support             End of Session Equipment Utilized During Treatment: Gait belt;Rolling walker  Activity Tolerance: Patient tolerated treatment well Patient left: in chair;with call bell/phone within reach   Time: 1050-1114 OT Time Calculation (min): 24 min Charges:  OT General Charges $OT Visit: 1 Procedure OT Evaluation $OT Eval Moderate Complexity: 1 Procedure OT Treatments $Self Care/Home Management : 8-22 mins  Evette GeorgesLeonard, Brandyn Lowrey Eva [phone] 07/05/2015, 11:35 AM

## 2015-07-05 NOTE — Progress Notes (Signed)
Kristi Erickson to be D/C'd Skilled nursing facility per MD order. Discussed with the patient and all questions fully answered.    VVS, Skin clean, dry and intact without evidence of skin break down, no evidence of skin tears noted.  IV catheter discontinued intact. Site without signs and symptoms of complications. Dressing and pressure applied.    Patient escorted via stretcher, and [facility].  Kai LevinsJacobs, Siegfried Vieth N  07/05/2015 3:48 PM

## 2015-07-06 ENCOUNTER — Non-Acute Institutional Stay (SKILLED_NURSING_FACILITY): Payer: Medicare Other | Admitting: Adult Health

## 2015-07-06 ENCOUNTER — Encounter: Payer: Self-pay | Admitting: Adult Health

## 2015-07-06 DIAGNOSIS — E785 Hyperlipidemia, unspecified: Secondary | ICD-10-CM | POA: Diagnosis not present

## 2015-07-06 DIAGNOSIS — M810 Age-related osteoporosis without current pathological fracture: Secondary | ICD-10-CM

## 2015-07-06 DIAGNOSIS — I712 Thoracic aortic aneurysm, without rupture: Secondary | ICD-10-CM | POA: Diagnosis not present

## 2015-07-06 DIAGNOSIS — D72829 Elevated white blood cell count, unspecified: Secondary | ICD-10-CM

## 2015-07-06 DIAGNOSIS — F329 Major depressive disorder, single episode, unspecified: Secondary | ICD-10-CM | POA: Diagnosis not present

## 2015-07-06 DIAGNOSIS — G4700 Insomnia, unspecified: Secondary | ICD-10-CM | POA: Diagnosis not present

## 2015-07-06 DIAGNOSIS — K589 Irritable bowel syndrome without diarrhea: Secondary | ICD-10-CM | POA: Diagnosis not present

## 2015-07-06 DIAGNOSIS — E876 Hypokalemia: Secondary | ICD-10-CM | POA: Diagnosis not present

## 2015-07-06 DIAGNOSIS — I10 Essential (primary) hypertension: Secondary | ICD-10-CM | POA: Diagnosis not present

## 2015-07-06 DIAGNOSIS — D62 Acute posthemorrhagic anemia: Secondary | ICD-10-CM

## 2015-07-06 DIAGNOSIS — E039 Hypothyroidism, unspecified: Secondary | ICD-10-CM | POA: Diagnosis not present

## 2015-07-06 DIAGNOSIS — I7121 Aneurysm of the ascending aorta, without rupture: Secondary | ICD-10-CM

## 2015-07-06 DIAGNOSIS — R5381 Other malaise: Secondary | ICD-10-CM | POA: Diagnosis not present

## 2015-07-06 DIAGNOSIS — F32A Depression, unspecified: Secondary | ICD-10-CM

## 2015-07-06 NOTE — Progress Notes (Signed)
Patient ID: Kristi Erickson, female   DOB: [birth year], 75 y.o.   MRN: [id number]    DATE:  07/06/2015   MRN:  [id number]  BIRTHDAY: [birth year]  Facility:  Nursing Home Location:  [facility]  Nursing Home Room Number: 1204-P  LEVEL OF CARE:  SNF (31)  Contact Information    Name Relation Home Work Mobile   [place] Brother [phone]         Code Status History    Date Active Date Inactive Code Status Order ID Comments User Context   06/28/2015  2:48 PM 07/02/2015 12:49 PM Full Code [id number]  Rowe Clack, PA-C Inpatient    Advance Directive Documentation        Most Recent Value   Type of Advance Directive  Out of facility DNR (pink MOST or yellow form)   Pre-existing out of facility DNR order (yellow form or pink MOST form)     "MOST" Form in Place?         Chief Complaint  Patient presents with  . Hospitalization Follow-up    HISTORY OF PRESENT ILLNESS:  This is a 75 year old female who has been admitted to [facility] on 07/05/15 from [facility]. She has PMH of Aortic Regurgitation, HTN, HLD, Hypothyroidism, Turner's Syndrome, Horseshoe Kidney, GERD, Glaucoma. She had an echocardiogram in 2013 which showed moderate aortic insufficiency and preserved left ventricular function. She recently had a repeat echocardiogram. It showed mild to moderate aortic insufficiency, normal left ventricular function and mild to moderate aortic insufficiency. The ascending aorta was noted to be dilated. 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 There was marked dilatation of the origin of the innominate artery as well. There was no evidence of dissection. She reports having SOB with exertion after walking about a block or a flight of stairs. She has consulted Dr. Dorris Fetch on 05/19/15.  She consulted Dr. Karl Ito for second opinion who agreed that she needed  surgical repair. She had cardiac catheterization. There was a 90% stenosis in a tiny branch of the first diagonal. There was no other significant coronary disease. 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 She required post-of transportation due to acute blood loss anemia.  She has been admitted for a short-term rehabilitation.  PAST MEDICAL HISTORY:  Past Medical History  Diagnosis Date  . Vision disorder   . Stomach problems   . Vitamin D deficiency   . Turner's syndrome   . GERD (gastroesophageal reflux disease)   . Glaucoma   . Hypertension   . Hyperlipidemia   . Osteoporosis   . Heart murmur   . Hypothyroidism   . Anxiety   . History of hiatal hernia   . Diverticulitis      CURRENT MEDICATIONS: Reviewed  Patient's Medications  New Prescriptions   No medications on file  Previous Medications   ALENDRONATE (FOSAMAX) 70 MG TABLET    Take 70 mg by mouth every Tuesday.    ASCORBIC ACID (VITAMIN C) 1000 MG TABLET    Take 3,000 mg by mouth daily.    ASPIRIN EC 325 MG EC TABLET    Take 1 tablet (325 mg total) by mouth daily.   CALCIUM-VITAMIN D (OSCAL WITH D) 500-200 MG-UNIT TABLET    Take 1 tablet by mouth daily with breakfast.  CHOLECALCIFEROL (VITAMIN D) 2000 UNITS CAPS    Take 2,000 Units by mouth daily.    DICYCLOMINE (BENTYL) 20 MG TABLET    Take 20 mg by mouth 2 (two) times daily.    DORZOLAMIDE (TRUSOPT) 2 % OPHTHALMIC SOLUTION    Place 1 drop into both eyes 2 (two) times daily.   LEVOTHYROXINE (SYNTHROID, LEVOTHROID) 88 MCG TABLET    Take 88 mcg by mouth every morning.    LISINOPRIL (PRINIVIL,ZESTRIL) 20 MG TABLET    Take 1 tablet (20 mg total) by mouth daily.   METOPROLOL TARTRATE (LOPRESSOR) 25 MG TABLET    Take 1 tablet (25 mg total) by mouth 2 (two) times daily.   MIRTAZAPINE (REMERON) 45 MG TABLET    Take 45 mg by mouth at bedtime.   MULTIPLE VITAMIN (MULTIVITAMIN) TABLET    Take 1 tablet by mouth daily.    OXYCODONE (OXY IR/ROXICODONE) 5 MG IMMEDIATE RELEASE TABLET    Take 1-2 tablets (5-10 mg total) by mouth every 6 (six) hours as needed for severe pain.   SIMVASTATIN (ZOCOR) 40 MG TABLET    Take 40 mg by mouth every morning.    TEMAZEPAM (RESTORIL) 15 MG CAPSULE    Take 15 mg by mouth at bedtime.   Modified Medications   No medications on file  Discontinued Medications   No medications on file     Allergies  Allergen Reactions  . Remeron [Mirtazapine] Other (See Comments)    Other reaction(s): Unknown Did not work  . Shellfish-Derived Products Nausea And Vomiting    Clam, oyster     REVIEW OF SYSTEMS:  GENERAL: no change in appetite, no fatigue, no weight changes, no fever, chills or weakness EYES: Denies change in vision, dry eyes, eye pain, itching or discharge EARS: Denies change in hearing, ringing in ears, or earache NOSE: Denies nasal congestion or epistaxis MOUTH and THROAT: Denies oral discomfort, gingival pain or bleeding, pain from teeth or hoarseness   RESPIRATORY: no cough, SOB, DOE, wheezing, hemoptysis CARDIAC: no chest pain, edema or palpitations GI: no abdominal pain, diarrhea, constipation, heart burn, nausea or vomiting GU: Denies dysuria, frequency, hematuria, incontinence, or discharge PSYCHIATRIC: Denies feeling of depression or anxiety. No report of hallucinations, insomnia, paranoia, or agitation   PHYSICAL EXAMINATION  GENERAL APPEARANCE: Well nourished. In no acute distress. Normal body habitus SKIN:  Chest midline surgical incision and 2 epigastric surgical incision covered with Steri-Strips, right chest surgical incision, no redness, dry HEAD: Normal in size and contour. No evidence of trauma EYES: Lids open and close normally. No blepharitis, entropion or ectropion. PERRL. Conjunctivae are clear and sclerae are white. Lenses are without opacity EARS: Pinnae are normal. Patient hears normal voice tunes of the examiner MOUTH and THROAT: Lips are  without lesions. Oral mucosa is moist and without lesions. Tongue is normal in shape, size, and color and without lesions NECK: supple, trachea midline, no neck masses, no thyroid tenderness, no thyromegaly LYMPHATICS: no LAN in the neck, no supraclavicular LAN RESPIRATORY: breathing is even & unlabored, BS CTAB CARDIAC: RRR, no murmur,no extra heart sounds, no edema GI: abdomen soft, normal BS, no masses, no tenderness, no hepatomegaly, no splenomegaly EXTREMITIES:   Able to move 4 extremities PSYCHIATRIC: Alert and oriented X 3. Affect and behavior are appropriate  LABS/RADIOLOGY: Labs reviewed: Basic Metabolic Panel:  Recent Labs  62/13/0806/19/17 2030  06/29/15 0452  06/29/15 1801  07/02/15 0505 07/03/15 0338 07/05/15 0318  NA  --   < > 133*  < >  --   < >  132* 136 136  K  --   < > 3.3*  < >  --   < > 3.1* 3.5 3.2*  CL  --   < > 102  < >  --   < > 100* 106 103  CO2  --   --  22  --   --   < > 25 22 25   GLUCOSE  --   < > 100*  < >  --   < > 80 102* 115*  BUN  --   < > 7  < >  --   < > 13 15 10   CREATININE 0.44  < > 0.49  < > 0.60  < > 0.48 0.54 0.52  CALCIUM  --   --  7.2*  --   --   < > 7.8* 8.6* 8.7*  MG 3.0*  --  2.0  --  1.9  --   --   --   --   < > = values in this interval not displayed. Liver Function Tests:  Recent Labs  06/24/15 1309  AST 25  ALT 18  ALKPHOS 66  BILITOT 0.7  PROT 6.5  ALBUMIN 4.2   CBC:  Recent Labs  07/02/15 0505 07/03/15 0338 07/05/15 0318  WBC 9.1 8.9 16.1*  HGB 10.4* 11.1* 11.8*  HCT 30.8* 32.6* 35.3*  MCV 87.7 88.8 91.7  PLT 164 185 263   CBG:  Recent Labs  07/01/15 1530 07/01/15 2206 07/02/15 0809  GLUCAP 77 82 82     Dg Chest 2 View  07/03/2015  CLINICAL DATA:  Surgery for aortic aneurysm last Monday. Now with complaint of some congestion and productive cough as well is mild chest pain and shortness of breath with exertion. EXAM: CHEST  2 VIEW COMPARISON:  Chest x-rays dated 07/02/2015 in 06/2020 2017. FINDINGS:  Continued improvement in aeration bilaterally. No evidence of significant edema on today's exam. Small pleural effusions are again seen bilaterally, with adjacent mild bibasilar atelectasis. Cardiomediastinal silhouette is stable in size and configuration. Right IJ sheath has been removed in the interval. Atherosclerotic changes are seen at the aortic arch. IMPRESSION: 1. Continued improvement in aeration bilaterally, suggesting improved fluid status. 2. Small bilateral pleural effusions, with probable mild bibasilar atelectasis. No evidence of pneumonia. 3. Aortic atherosclerosis. Electronically Signed   By: Bary Richard M.D.   On: 07/03/2015 10:56   Dg Chest 2 View  06/24/2015  CLINICAL DATA:  Pre admit for repair of ascending aortic aneurysm, shortness of breath with exertion EXAM: CHEST  2 VIEW COMPARISON:  CT angio chest of 04/29/2015 and chest x-ray of 08/12/2006 FINDINGS: No active infiltrate or effusion is seen. On the frontal view the aortic knob is more prominent than on chest x-ray from 2008 indicating the fusiform aneurysmal dilatation noted by CT of the chest. No mediastinal or hilar adenopathy is seen. The heart is within upper limits of normal. No acute bony abnormality is noted. IMPRESSION: 1. No active infiltrate or effusion. 2. Prominence of the aortic knob consistent with fusiform aneurysmal dilatation noted on recent CT of the chest. Electronically Signed   By: Dwyane Dee M.D.   On: 06/24/2015 13:26   Dg Chest Port 1 View  07/02/2015  CLINICAL DATA:  Aortic arch aneurysm, aortic insufficiency, status post CABG, CHF. EXAM: PORTABLE CHEST 1 VIEW COMPARISON:  Portable chest x-ray of July 01, 2015 FINDINGS: The lungs are adequately inflated. Bilateral pleural effusions layer posteriorly. The cardiac silhouette remains enlarged.  There remains widening of the mediastinum. The right internal jugular Cordis sheath demonstrates a stable kink but has its tip over the proximal SVC. The sternal wires  are intact. IMPRESSION: Stable mild CHF with bilateral pleural effusions. Known ascending thoracic aortic aneurysm. Electronically Signed   By: David  [place]and M.D.   On: 07/02/2015 08:09   Dg Chest Port 1 View  07/01/2015  CLINICAL DATA:  Pleural effusion.  Shortness of breath . EXAM: PORTABLE CHEST 1 VIEW COMPARISON:  06/30/2015 . FINDINGS: Right IJ sheath in stable position. Prior median sternotomy. Cardiomegaly. Slightly increased bilateral pulmonary infiltrates/edema. Bilateral pleural effusions. Findings consistent with congestive heart failure. No pneumothorax. Surgical clips right axilla and upper chest. IMPRESSION: 1.  Right IJ sheath in stable position . 2. Prior median sternotomy. Cardiomegaly with interim slight increase in bilateral pulmonary infiltrates consistent with increasing pulmonary edema. Bilateral pleural effusions again noted. These findings are consistent with congestive heart failure. Electronically Signed   By: Maisie Fus  Register   On: 07/01/2015 07:15   Dg Chest Port 1 View  06/30/2015  CLINICAL DATA:  Chest soreness. Status post ascending aortic aneurysm repair 06/28/2015. EXAM: PORTABLE CHEST 1 VIEW COMPARISON:  Single-view of the chest 06/29/2015 and 06/28/2015. FINDINGS: Right IJ approach Swan-Ganz catheter and mediastinal drains have been removed. Right IJ sheath remains in place. Bilateral pleural effusions are identified with basilar atelectasis, both increased since yesterday's examination. Heart size is mildly enlarged. Widening of the upper mediastinum is unchanged in appearance. IMPRESSION: Increased bilateral pleural effusions and basilar atelectasis. Status post removal of mediastinal drains and Swan-Ganz catheter. No pneumothorax. Electronically Signed   By: Drusilla Kanner M.D.   On: 06/30/2015 08:00   Dg Chest Port 1 View  06/29/2015  CLINICAL DATA:  Status post repair aortic arch aneurysm EXAM: PORTABLE CHEST 1 VIEW COMPARISON:  Portable chest x-ray of June 28, 2015. FINDINGS: The lungs are adequately inflated. There is persistent increased density in the retrocardiac region on the left with further obscuration of the hemidiaphragm. Hazy increased density in the right infrahilar region has developed is well. The cardiac silhouette remains enlarged. The mediastinum remains mildly widened. Two mediastinal drains are in stable position. The Swan-Ganz catheter tip projects in the proximal main pulmonary artery trunk. The esophagogastric tube and endotracheal tube have been removed. IMPRESSION: Slight interval worsening of bibasilar atelectasis and probable trace left pleural effusion. The remaining support tubes are in reasonable position. Electronically Signed   By: David  [place]aziland M.D.   On: 06/29/2015 07:28   Dg Chest Port 1 View  06/28/2015  CLINICAL DATA:  Followup repair of aortic arch aneurysm EXAM: PORTABLE CHEST 1 VIEW COMPARISON:  06/24/2015 FINDINGS: Interval median sternotomy. Endotracheal tube has its tip 1.5 cm above the carina. Nasogastric tube has its tip in the stomach, esophagus being tortuous. Swan-Ganz catheter has its tip in the main pulmonary artery. Mediastinal drains are in place. Prominence of the superior mediastinum not unexpected following surgery in that region. Near complete collapse of the left lower lobe. No pneumothorax. IMPRESSION: Lines and tubes well positioned. Near complete collapse of the left lower lobe. Electronically Signed   By: Paulina Fusi M.D.   On: 06/28/2015 15:28    ASSESSMENT/PLAN:  Physical deconditioning - for rehabilitation, PT and OT; fall precaution  Ascending and arch aneurysm S/P repair - for rehabilitation; follow-up with Dr. Chrystie Nose, in 2 weeks, cardiology and Dr. Charlett Lango, cardiothoracic, on 07/07/15; continue oxycodone 5 mg 1-2 tabs by mouth every 6  hours when necessary for pain; continue aspirin 325 mg EC 1 tab by mouth daily  Osteoporosis - continue Fosamax 70 mg 1 tab by mouth every  Tuesdays  Hypothyroidism - continue levothyroxine 88 g 1 tab by mouth daily Lab Results  Component Value Date   TSH 2.87 06/01/2015   IBS - Continue dicyclomine 20 mg 1 tab by mouth twice a day  Hypertension - continue metoprolol tartrate 25 mg 1 tab by mouth twice a day, lisinopril 20 mg 1 tab by mouth daily  Depression - continue mirtazapine 45 mg 1 tab by mouth daily at bedtime  Hyperlipidemia - continue simvastatin 40 mg 1 tab by mouth every evening; check lipid panel  Insomnia - continue temazepam 15 mg 1 capsule by mouth daily at bedtime  Acute blood loss anemia - S/P blood transfusion; check CBC Lab Results  Component Value Date   WBC 16.1* 07/05/2015   HGB 11.8* 07/05/2015   HCT 35.3* 07/05/2015   MCV 91.7 07/05/2015   PLT 263 07/05/2015   Leukocytosis - no fever, no dysuria, no cough; will monitor Lab Results  Component Value Date   WBC 16.1* 07/05/2015   Hypokalemia - check BMP Lab Results  Component Value Date   K 3.2* 07/05/2015      Goals of care:  Short-term rehabilitation     Kenard GowerMonina Medina-Vargas, NP [facility] [phone]

## 2015-07-07 LAB — BASIC METABOLIC PANEL
BUN: 12 mg/dL (ref 4–21)
CREATININE: 0.6 mg/dL (ref 0.5–1.1)
Glucose: 92 mg/dL
POTASSIUM: 3.9 mmol/L (ref 3.4–5.3)
Sodium: 137 mmol/L (ref 137–147)

## 2015-07-07 LAB — LIPID PANEL
Cholesterol: 114 mg/dL (ref 0–200)
HDL: 24 mg/dL — AB (ref 35–70)
LDL CALC: 63 mg/dL
Triglycerides: 135 mg/dL (ref 40–160)

## 2015-07-07 LAB — CBC AND DIFFERENTIAL
HCT: 32 % — AB (ref 36–46)
Hemoglobin: 10.8 g/dL — AB (ref 12.0–16.0)
PLATELETS: 292 10*3/uL (ref 150–399)
WBC: 12.2 10*3/mL

## 2015-07-08 ENCOUNTER — Non-Acute Institutional Stay (SKILLED_NURSING_FACILITY): Payer: Medicare Other | Admitting: Internal Medicine

## 2015-07-08 ENCOUNTER — Encounter: Payer: Self-pay | Admitting: Internal Medicine

## 2015-07-08 DIAGNOSIS — M810 Age-related osteoporosis without current pathological fracture: Secondary | ICD-10-CM

## 2015-07-08 DIAGNOSIS — R5381 Other malaise: Secondary | ICD-10-CM

## 2015-07-08 DIAGNOSIS — I7121 Aneurysm of the ascending aorta, without rupture: Secondary | ICD-10-CM

## 2015-07-08 DIAGNOSIS — F329 Major depressive disorder, single episode, unspecified: Secondary | ICD-10-CM

## 2015-07-08 DIAGNOSIS — E785 Hyperlipidemia, unspecified: Secondary | ICD-10-CM

## 2015-07-08 DIAGNOSIS — I10 Essential (primary) hypertension: Secondary | ICD-10-CM

## 2015-07-08 DIAGNOSIS — I4891 Unspecified atrial fibrillation: Secondary | ICD-10-CM | POA: Diagnosis not present

## 2015-07-08 DIAGNOSIS — D62 Acute posthemorrhagic anemia: Secondary | ICD-10-CM

## 2015-07-08 DIAGNOSIS — E039 Hypothyroidism, unspecified: Secondary | ICD-10-CM

## 2015-07-08 DIAGNOSIS — I712 Thoracic aortic aneurysm, without rupture: Secondary | ICD-10-CM | POA: Diagnosis not present

## 2015-07-08 DIAGNOSIS — E876 Hypokalemia: Secondary | ICD-10-CM | POA: Diagnosis not present

## 2015-07-08 DIAGNOSIS — F32A Depression, unspecified: Secondary | ICD-10-CM

## 2015-07-08 DIAGNOSIS — D72829 Elevated white blood cell count, unspecified: Secondary | ICD-10-CM

## 2015-07-08 NOTE — Progress Notes (Signed)
LOCATION: [facility]  PCP: Gaye AlkenBARNES,ELIZABETH STEWART, MD   Code Status: DNR  Goals of care: Advanced Directive information Advanced Directives 07/08/2015  Does patient have an advance directive? Yes  Type of Advance Directive Out of facility DNR (pink MOST or yellow form)  Does patient want to make changes to advanced directive? No - Patient declined  Copy of advanced directive(s) in chart? Yes       Extended Emergency Contact Information Primary Emergency Contact: Tufano,Tom Address: [address]          [place], [place] [id number] [place] Home Phone: [phone] Relation: Brother   Allergies  Allergen Reactions  . Remeron [Mirtazapine] Other (See Comments)    Other reaction(s): Unknown Did not work  . Shellfish-Derived Products Nausea And Vomiting    Clam, oyster    Chief Complaint  Patient presents with  . New Admit To SNF    New Admission     HPI:  Patient is a 75 y.o. 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 Intra operatively, she had afib and ABLA. She required amiodarone and blood transfusion. She is seen in her room today.  Review of Systems:  Constitutional: Negative for fever, chills. Energy level is slowly returning.  HENT: Negative for headache, congestion, nasal discharge. Eyes: Negative for blurred vision, double vision and discharge. Wears glasses.  Respiratory: Negative for shortness of breath and wheezing. Positive for dry cough.   Cardiovascular: Negative for chest pain, palpitations, leg swelling.  Positive for chest wall soreness.  Gastrointestinal: Negative for heartburn, nausea, vomiting, abdominal pain. Last bowel movement was 2 days ago. Genitourinary: Negative for dysuria and flank pain.  Musculoskeletal: Negative for fall in the facility.  Skin:  Negative for itching, rash.  Neurological: Negative for dizziness. Psychiatric/Behavioral: Negative for depression   Past Medical History  Diagnosis Date  . Vision disorder   . Stomach problems   . Vitamin D deficiency   . Turner's syndrome   . GERD (gastroesophageal reflux disease)   . Glaucoma   . Hypertension   . Hyperlipidemia   . Osteoporosis   . Heart murmur   . Hypothyroidism   . Anxiety   . History of hiatal hernia   . Diverticulitis    Past Surgical History  Procedure Laterality Date  . Other surgical history  2010     ear surgery  . Abdominal surgery  1995    1970s  cyst from abdomen removed  . Transthoracic echocardiogram  08/2011    EF=/> 55%, LA mildly dilated, midl mitral annular calcification, trace MR, trace TR, mild AV calcification w/mod regurg, trace pulm valve regurg  . Cardiac catheterization N/A 06/03/2015    Procedure: Left Heart Cath and Coronary Angiography;  Surgeon: Corky CraftsJayadeep S Varanasi, MD;  [facility];  Service: Cardiovascular;  Laterality: N/A;  . Cataract extraction w/ intraocular lens  implant, bilateral    . Glaucoma valve insertion      right eye  . Appendectomy    . Tonsillectomy      t+a  . Eye surgery      left eye muscle     age 75  . Replacement ascending aorta N/A 06/28/2015    Procedure: REPLACEMENT ASCENDING AORTA AND ARCH;  Surgeon: Loreli SlotSteven C Hendrickson, MD;  Location: [facility] OR;  Service: Open Heart  Surgery;  Laterality: N/A;  . Tee without cardioversion N/A 06/28/2015    Procedure: TRANSESOPHAGEAL ECHOCARDIOGRAM (TEE);  Surgeon: Loreli Slot, MD;  Location: [facility] OR;  Service: Open Heart Surgery;  Laterality: N/A;   Social History:   reports that she has quit smoking. She has never used smokeless tobacco. She reports that she drinks alcohol. She reports that she does not use illicit drugs.  Family History  Problem Relation Age of Onset  . Heart attack Maternal Grandfather   . Heart attack Father   . Skin  cancer Brother     Medications:   Medication List       This list is accurate as of: 07/08/15 12:51 PM.  Always use your most recent med list.               aspirin 325 MG EC tablet  Take 1 tablet (325 mg total) by mouth daily.     calcium-vitamin D 500-200 MG-UNIT tablet  Commonly known as:  OSCAL WITH D  Take 1 tablet by mouth daily with breakfast.     dicyclomine 20 MG tablet  Commonly known as:  BENTYL  Take 20 mg by mouth 2 (two) times daily.     dorzolamide 2 % ophthalmic solution  Commonly known as:  TRUSOPT  Place 1 drop into both eyes 2 (two) times daily.     FOSAMAX 70 MG tablet  Generic drug:  alendronate  Take 70 mg by mouth every Tuesday.     levothyroxine 88 MCG tablet  Commonly known as:  SYNTHROID, LEVOTHROID  Take 88 mcg by mouth every morning.     lisinopril 20 MG tablet  Commonly known as:  PRINIVIL,ZESTRIL  Take 1 tablet (20 mg total) by mouth daily.     metoprolol tartrate 25 MG tablet  Commonly known as:  LOPRESSOR  Take 1 tablet (25 mg total) by mouth 2 (two) times daily.     mirtazapine 45 MG tablet  Commonly known as:  REMERON  Take 45 mg by mouth at bedtime.     multivitamin tablet  Take 1 tablet by mouth daily.     oxyCODONE 5 MG immediate release tablet  Commonly known as:  Oxy IR/ROXICODONE  Take 1-2 tablets (5-10 mg total) by mouth every 6 (six) hours as needed for severe pain.     simvastatin 40 MG tablet  Commonly known as:  ZOCOR  Take 40 mg by mouth every morning.     temazepam 15 MG capsule  Commonly known as:  RESTORIL  Take 15 mg by mouth at bedtime.     vitamin C 1000 MG tablet  Take 3,000 mg by mouth daily.     Vitamin D 2000 units Caps  Take 2,000 Units by mouth daily.        Immunizations:  There is no immunization history on file for this patient.   Physical Exam:  Filed Vitals:   07/08/15 1206  BP: 100/59  Pulse: 58  Temp: 98.1 F (36.7 C)  TempSrc: Oral  Resp: 16  Height: 4\' 10"  (1.473  m)  Weight: 119 lb (53.978 kg)  SpO2: 96%   Body mass index is 24.88 kg/(m^2).  General- elderly female, well built, in no acute distress Head- normocephalic, atraumatic, hearing aids present Nose- no nasal discharge Throat- moist mucus membrane Eyes- no pallor, no icterus, no discharge Neck- no cervical lymphadenopathy Cardiovascular- normal s1,s2, no murmur, trace leg edema Respiratory- bilateral clear to auscultation, no wheeze, no rhonchi,  no crackles, no use of accessory muscles Abdomen- bowel sounds present, soft, non tender Musculoskeletal- able to move all 4 extremities, generalized weakness Neurological- alert and oriented to person, place and time Skin- warm and dry, surgical incision to chest wall healing well, steristrips to surgical incision Psychiatry- normal mood and affect    Labs reviewed: Basic Metabolic Panel:  Recent Labs  16/10/9604/19/17 2030  06/29/15 0452  06/29/15 1801  07/02/15 0505 07/03/15 0338 07/05/15 0318 07/07/15  NA  --   < > 133*  < >  --   < > 132* 136 136 137  K  --   < > 3.3*  < >  --   < > 3.1* 3.5 3.2* 3.9  CL  --   < > 102  < >  --   < > 100* 106 103  --   CO2  --   --  22  --   --   < > 25 22 25   --   GLUCOSE  --   < > 100*  < >  --   < > 80 102* 115*  --   BUN  --   < > 7  < >  --   < > 13 15 10 12   CREATININE 0.44  < > 0.49  < > 0.60  < > 0.48 0.54 0.52 0.6  CALCIUM  --   --  7.2*  --   --   < > 7.8* 8.6* 8.7*  --   MG 3.0*  --  2.0  --  1.9  --   --   --   --   --   < > = values in this interval not displayed. Liver Function Tests:  Recent Labs  06/24/15 1309  AST 25  ALT 18  ALKPHOS 66  BILITOT 0.7  PROT 6.5  ALBUMIN 4.2   No results for input(s): LIPASE, AMYLASE in the last 8760 hours. No results for input(s): AMMONIA in the last 8760 hours. CBC:  Recent Labs  07/02/15 0505 07/03/15 0338 07/05/15 0318 07/07/15  WBC 9.1 8.9 16.1* 12.2  HGB 10.4* 11.1* 11.8* 10.8*  HCT 30.8* 32.6* 35.3* 32*  MCV 87.7 88.8 91.7  --    PLT 164 185 263 292   Cardiac Enzymes: No results for input(s): CKTOTAL, CKMB, CKMBINDEX, TROPONINI in the last 8760 hours. BNP: Invalid input(s): POCBNP CBG:  Recent Labs  07/01/15 1530 07/01/15 2206 07/02/15 0809  GLUCAP 77 82 82    Radiological Exams: Dg Chest 2 View  07/03/2015  CLINICAL DATA:  Surgery for aortic aneurysm last Monday. Now with complaint of some congestion and productive cough as well is mild chest pain and shortness of breath with exertion. EXAM: CHEST  2 VIEW COMPARISON:  Chest x-rays dated 07/02/2015 in 06/2020 2017. FINDINGS: Continued improvement in aeration bilaterally. No evidence of significant edema on today's exam. Small pleural effusions are again seen bilaterally, with adjacent mild bibasilar atelectasis. Cardiomediastinal silhouette is stable in size and configuration. Right IJ sheath has been removed in the interval. Atherosclerotic changes are seen at the aortic arch. IMPRESSION: 1. Continued improvement in aeration bilaterally, suggesting improved fluid status. 2. Small bilateral pleural effusions, with probable mild bibasilar atelectasis. No evidence of pneumonia. 3. Aortic atherosclerosis. Electronically Signed   By: Bary RichardStan  Maynard M.D.   On: 07/03/2015 10:56   Dg Chest 2 View  06/24/2015  CLINICAL DATA:  Pre admit for repair of ascending aortic aneurysm, shortness of breath  with exertion EXAM: CHEST  2 VIEW COMPARISON:  CT angio chest of 04/29/2015 and chest x-ray of 08/12/2006 FINDINGS: No active infiltrate or effusion is seen. On the frontal view the aortic knob is more prominent than on chest x-ray from 2008 indicating the fusiform aneurysmal dilatation noted by CT of the chest. No mediastinal or hilar adenopathy is seen. The heart is within upper limits of normal. No acute bony abnormality is noted. IMPRESSION: 1. No active infiltrate or effusion. 2. Prominence of the aortic knob consistent with fusiform aneurysmal dilatation noted on recent CT of the  chest. Electronically Signed   By: Dwyane Dee M.D.   On: 06/24/2015 13:26   Dg Chest Port 1 View  07/02/2015  CLINICAL DATA:  Aortic arch aneurysm, aortic insufficiency, status post CABG, CHF. EXAM: PORTABLE CHEST 1 VIEW COMPARISON:  Portable chest x-ray of July 01, 2015 FINDINGS: The lungs are adequately inflated. Bilateral pleural effusions layer posteriorly. The cardiac silhouette remains enlarged. There remains widening of the mediastinum. The right internal jugular Cordis sheath demonstrates a stable kink but has its tip over the proximal SVC. The sternal wires are intact. IMPRESSION: Stable mild CHF with bilateral pleural effusions. Known ascending thoracic aortic aneurysm. Electronically Signed   By: David  [place] M.D.   On: 07/02/2015 08:09   Dg Chest Port 1 View  07/01/2015  CLINICAL DATA:  Pleural effusion.  Shortness of breath . EXAM: PORTABLE CHEST 1 VIEW COMPARISON:  06/30/2015 . FINDINGS: Right IJ sheath in stable position. Prior median sternotomy. Cardiomegaly. Slightly increased bilateral pulmonary infiltrates/edema. Bilateral pleural effusions. Findings consistent with congestive heart failure. No pneumothorax. Surgical clips right axilla and upper chest. IMPRESSION: 1.  Right IJ sheath in stable position . 2. Prior median sternotomy. Cardiomegaly with interim slight increase in bilateral pulmonary infiltrates consistent with increasing pulmonary edema. Bilateral pleural effusions again noted. These findings are consistent with congestive heart failure. Electronically Signed   By: Maisie Fus  Register   On: 07/01/2015 07:15   Dg Chest Port 1 View  06/30/2015  CLINICAL DATA:  Chest soreness. Status post ascending aortic aneurysm repair 06/28/2015. EXAM: PORTABLE CHEST 1 VIEW COMPARISON:  Single-view of the chest 06/29/2015 and 06/28/2015. FINDINGS: Right IJ approach Swan-Ganz catheter and mediastinal drains have been removed. Right IJ sheath remains in place. Bilateral pleural effusions are  identified with basilar atelectasis, both increased since yesterday's examination. Heart size is mildly enlarged. Widening of the upper mediastinum is unchanged in appearance. IMPRESSION: Increased bilateral pleural effusions and basilar atelectasis. Status post removal of mediastinal drains and Swan-Ganz catheter. No pneumothorax. Electronically Signed   By: Drusilla Kanner M.D.   On: 06/30/2015 08:00   Dg Chest Port 1 View  06/29/2015  CLINICAL DATA:  Status post repair aortic arch aneurysm EXAM: PORTABLE CHEST 1 VIEW COMPARISON:  Portable chest x-ray of June 28, 2015. FINDINGS: The lungs are adequately inflated. There is persistent increased density in the retrocardiac region on the left with further obscuration of the hemidiaphragm. Hazy increased density in the right infrahilar region has developed is well. The cardiac silhouette remains enlarged. The mediastinum remains mildly widened. Two mediastinal drains are in stable position. The Swan-Ganz catheter tip projects in the proximal main pulmonary artery trunk. The esophagogastric tube and endotracheal tube have been removed. IMPRESSION: Slight interval worsening of bibasilar atelectasis and probable trace left pleural effusion. The remaining support tubes are in reasonable position. Electronically Signed   By: David  [place] M.D.   On: 06/29/2015 07:28   Dg  Chest Port 1 View  06/28/2015  CLINICAL DATA:  Followup repair of aortic arch aneurysm EXAM: PORTABLE CHEST 1 VIEW COMPARISON:  06/24/2015 FINDINGS: Interval median sternotomy. Endotracheal tube has its tip 1.5 cm above the carina. Nasogastric tube has its tip in the stomach, esophagus being tortuous. Swan-Ganz catheter has its tip in the main pulmonary artery. Mediastinal drains are in place. Prominence of the superior mediastinum not unexpected following surgery in that region. Near complete collapse of the left lower lobe. No pneumothorax. IMPRESSION: Lines and tubes well positioned. Near complete  collapse of the left lower lobe. Electronically Signed   By: Paulina Fusi M.D.   On: 06/28/2015 15:28    Assessment/Plan  Physical deconditioning From generalized weakness. Will have her work with physical therapy and occupational therapy team to help with gait training and muscle strengthening exercises.fall precautions. Skin care. Encourage to be out of bed.   Ascending and arch aneurysm  S/P repair and has follow up with cardiothoracic surgery and cardiology. Continue aspirin 325 mg daily and oxyIR 5 mg 1-2 tab q6h prn pain.   Blood loss anemia Post op, s/p transfusion, monitor cbc. Start feso4 325 mg daily.   Hypokalemia Has resolved.   HTN bp on lower side of normal. Monitor bp. Add holding parameter to her lopressor. Continue lopressor 25 mg bid and lisinopril 20 mg daily.   Leukocytosis Improved, monitor wbc and temp curve  afib Rate controlled. Continue metoprolol 25 mg bid and aspirin 325 mg daily  Hypothyroidism Continue levothyroxine 88 mcg daily  Hyperlipidemia Continue simvastatin 40 mg daily  Osteoporosis continue fosamax weekly  Depression  continue mirtazapine 45 mg daily    Goals of care: short term rehabilitation   Labs/tests ordered: cbc in 1 week  Family/ staff Communication: reviewed care plan with patient and nursing supervisor    Oneal Grout, MD Internal Medicine [facility] [address] [place], [place] [address] Cell Phone (Monday-Friday 8 am - 5 pm): [phone] On Call: [phone] and follow prompts after 5 pm and on weekends Office Phone: [phone] Office Fax: [phone]

## 2015-07-16 ENCOUNTER — Encounter: Payer: Self-pay | Admitting: Adult Health

## 2015-07-16 ENCOUNTER — Non-Acute Institutional Stay (SKILLED_NURSING_FACILITY): Payer: Medicare Other | Admitting: Adult Health

## 2015-07-16 DIAGNOSIS — E039 Hypothyroidism, unspecified: Secondary | ICD-10-CM | POA: Diagnosis not present

## 2015-07-16 DIAGNOSIS — I7121 Aneurysm of the ascending aorta, without rupture: Secondary | ICD-10-CM

## 2015-07-16 DIAGNOSIS — I712 Thoracic aortic aneurysm, without rupture: Secondary | ICD-10-CM | POA: Diagnosis not present

## 2015-07-16 DIAGNOSIS — E785 Hyperlipidemia, unspecified: Secondary | ICD-10-CM | POA: Diagnosis not present

## 2015-07-16 DIAGNOSIS — I10 Essential (primary) hypertension: Secondary | ICD-10-CM | POA: Diagnosis not present

## 2015-07-16 DIAGNOSIS — D72829 Elevated white blood cell count, unspecified: Secondary | ICD-10-CM

## 2015-07-16 DIAGNOSIS — M810 Age-related osteoporosis without current pathological fracture: Secondary | ICD-10-CM

## 2015-07-16 DIAGNOSIS — F329 Major depressive disorder, single episode, unspecified: Secondary | ICD-10-CM

## 2015-07-16 DIAGNOSIS — D62 Acute posthemorrhagic anemia: Secondary | ICD-10-CM

## 2015-07-16 DIAGNOSIS — R5381 Other malaise: Secondary | ICD-10-CM | POA: Diagnosis not present

## 2015-07-16 DIAGNOSIS — G4700 Insomnia, unspecified: Secondary | ICD-10-CM | POA: Diagnosis not present

## 2015-07-16 DIAGNOSIS — F32A Depression, unspecified: Secondary | ICD-10-CM

## 2015-07-16 DIAGNOSIS — K5901 Slow transit constipation: Secondary | ICD-10-CM

## 2015-07-16 DIAGNOSIS — K589 Irritable bowel syndrome without diarrhea: Secondary | ICD-10-CM

## 2015-07-16 NOTE — Progress Notes (Signed)
Patient ID: Kristi Erickson, female   DOB: [birth year], 75 y.o.   MRN: [id number]    DATE:  07/16/2015   MRN:  [id number]  BIRTHDAY: 03/25/1940  Facility:  Nursing Home Location:  [facility]  Nursing Home Room Number: 1204-P  LEVEL OF CARE:  SNF (31)  Contact Information    Name Relation Home Work Mobile   [place] Brother [phone]         Code Status History    Date Active Date Inactive Code Status Order ID Comments User Context   06/28/2015  2:48 PM 07/02/2015 12:49 PM Full Code [id number]  Rowe Clack, PA-C Inpatient    Advance Directive Documentation        Most Recent Value   Type of Advance Directive  Out of facility DNR (pink MOST or yellow form)   Pre-existing out of facility DNR order (yellow form or pink MOST form)     "MOST" Form in Place?         Chief Complaint  Patient presents with  . Discharge Note    HISTORY OF PRESENT ILLNESS:  This is a 75 year old female who is for discharge home with Home Health OT, PT and Skilled Nurse. She will discharge with medications.  She has been admitted to [facility] on 07/05/15 from [facility]. She has PMH of Aortic Regurgitation, HTN, HLD, Hypothyroidism, Turner's Syndrome, Horseshoe Kidney, GERD, Glaucoma. She had an echocardiogram in 2013 which showed moderate aortic insufficiency and preserved left ventricular function. She recently had a repeat echocardiogram. It showed mild to moderate aortic insufficiency, normal left ventricular function and mild to moderate aortic insufficiency. The ascending aorta was noted to be dilated. 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 There was marked dilatation of the origin of the innominate artery as well. There was no evidence of dissection.  She reports having SOB with exertion after walking about a block or a flight of stairs. She has consulted Dr.  Dorris Fetch on 05/19/15.  She consulted Dr. Karl Ito for second opinion who agreed that she needed surgical repair. She had cardiac catheterization. There was a 90% stenosis in a tiny branch of the first diagonal. There was no other significant coronary disease. 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 She required post-of transportation due to acute blood loss anemia.  Patient was admitted to this facility for short-term rehabilitation after the patient's recent hospitalization.  Patient has completed SNF rehabilitation and therapy has cleared the patient for discharge.   PAST MEDICAL HISTORY:  Past Medical History  Diagnosis Date  . Vision disorder   . Stomach problems   . Vitamin D deficiency   . Turner's syndrome   . GERD (gastroesophageal reflux disease)   . Glaucoma   . Hypertension   . Hyperlipidemia   . Osteoporosis   . Heart murmur   . Hypothyroidism   . Anxiety   . History of hiatal hernia   . Diverticulitis   . Physical deconditioning   . Ascending aortic aneurysm (HCC)   . IBS (irritable bowel syndrome)   . Depression   . Insomnia   . Acute blood loss anemia   . Leukocytosis   . Hypokalemia      CURRENT MEDICATIONS: Reviewed  Patient's Medications  New Prescriptions   No medications on file  Previous Medications   ALENDRONATE (FOSAMAX) 70 MG TABLET    Take 70 mg by mouth every Tuesday.    ASCORBIC ACID (VITAMIN C) 1000 MG TABLET    Take 3,000 mg by mouth daily.    ASPIRIN EC 325 MG EC TABLET    Take 1 tablet (325 mg total) by mouth daily.   CALCIUM-VITAMIN D (OSCAL WITH D) 500-200 MG-UNIT TABLET    Take 1 tablet by mouth daily with breakfast.   CHOLECALCIFEROL (VITAMIN D) 2000 UNITS CAPS    Take 2,000 Units by mouth daily.    DICYCLOMINE (BENTYL) 20 MG TABLET    Take 20 mg by mouth 2 (two) times daily.    DOCUSATE SODIUM (COLACE) 100 MG CAPSULE    Take 100 mg by mouth 2 (two) times daily as needed for  mild constipation.   DORZOLAMIDE (TRUSOPT) 2 % OPHTHALMIC SOLUTION    Place 1 drop into both eyes 2 (two) times daily.   FERROUS SULFATE 325 (65 FE) MG TABLET    Take 325 mg by mouth daily with breakfast.   LEVOTHYROXINE (SYNTHROID, LEVOTHROID) 88 MCG TABLET    Take 88 mcg by mouth every morning.    LISINOPRIL (PRINIVIL,ZESTRIL) 20 MG TABLET    Take 1 tablet (20 mg total) by mouth daily.   METOPROLOL TARTRATE (LOPRESSOR) 25 MG TABLET    Take 1 tablet (25 mg total) by mouth 2 (two) times daily.   MIRTAZAPINE (REMERON) 45 MG TABLET    Take 45 mg by mouth at bedtime.   MULTIPLE VITAMIN (MULTIVITAMIN) TABLET    Take 1 tablet by mouth daily.   OXYCODONE (OXY IR/ROXICODONE) 5 MG IMMEDIATE RELEASE TABLET    Take 1-2 tablets (5-10 mg total) by mouth every 6 (six) hours as needed for severe pain.   SIMVASTATIN (ZOCOR) 40 MG TABLET    Take 40 mg by mouth every morning.    TEMAZEPAM (RESTORIL) 15 MG CAPSULE    Take 15 mg by mouth at bedtime.   Modified Medications   No medications on file  Discontinued Medications   No medications on file     Allergies  Allergen Reactions  . Remeron [Mirtazapine] Other (See Comments)    Other reaction(s): Unknown Did not work  . Shellfish-Derived Products Nausea And Vomiting    Clam, oyster     REVIEW OF SYSTEMS:  GENERAL: no change in appetite, no fatigue, no weight changes, no fever, chills or weakness EYES: Denies change in vision, dry eyes, eye pain, itching or discharge EARS: Denies change in hearing, ringing in ears, or earache NOSE: Denies nasal congestion or epistaxis MOUTH and THROAT: Denies oral discomfort, gingival pain or bleeding, pain from teeth or hoarseness   RESPIRATORY: no cough, SOB, DOE, wheezing, hemoptysis CARDIAC: no chest pain, edema or palpitations GI: no abdominal pain, diarrhea, constipation, heart burn, nausea or vomiting GU: Denies dysuria, frequency, hematuria, incontinence, or discharge PSYCHIATRIC: Denies feeling of  depression or anxiety. No report of hallucinations, insomnia, paranoia, or agitation   PHYSICAL EXAMINATION  GENERAL APPEARANCE: Well nourished. In no acute distress. Normal body habitus SKIN:  Chest midline surgical incision and 2 epigastric surgical incision , right chest surgical incision, no redness, dry HEAD: Normal in size and contour. No evidence of trauma EYES: Lids open and close normally. No blepharitis, entropion or ectropion. PERRL. Conjunctivae are clear and sclerae are white. Lenses are without opacity EARS: Pinnae are normal. Patient hears normal voice tunes of the examiner MOUTH and THROAT: Lips are  without lesions. Oral mucosa is moist and without lesions. Tongue is normal in shape, size, and color and without lesions NECK: supple, trachea midline, no neck masses, no thyroid tenderness, no thyromegaly LYMPHATICS: no LAN in the neck, no supraclavicular LAN RESPIRATORY: breathing is even & unlabored, BS CTAB CARDIAC: RRR, no murmur,no extra heart sounds, no edema GI: abdomen soft, normal BS, no masses, no tenderness, no hepatomegaly, no splenomegaly EXTREMITIES:   Able to move 4 extremities PSYCHIATRIC: Alert and oriented X 3. Affect and behavior are appropriate  LABS/RADIOLOGY: Labs reviewed: Basic Metabolic Panel:  Recent Labs  29/56/2106/19/17 2030  06/29/15 0452  06/29/15 1801  07/02/15 0505 07/03/15 0338 07/05/15 0318 07/07/15  NA  --   < > 133*  < >  --   < > 132* 136 136 137  K  --   < > 3.3*  < >  --   < > 3.1* 3.5 3.2* 3.9  CL  --   < > 102  < >  --   < > 100* 106 103  --   CO2  --   --  22  --   --   < > 25 22 25   --   GLUCOSE  --   < > 100*  < >  --   < > 80 102* 115*  --   BUN  --   < > 7  < >  --   < > 13 15 10 12   CREATININE 0.44  < > 0.49  < > 0.60  < > 0.48 0.54 0.52 0.6  CALCIUM  --   --  7.2*  --   --   < > 7.8* 8.6* 8.7*  --   MG 3.0*  --  2.0  --  1.9  --   --   --   --   --   < > = values in this interval not displayed. Liver Function  Tests:  Recent Labs  06/24/15 1309  AST 25  ALT 18  ALKPHOS 66  BILITOT 0.7  PROT 6.5  ALBUMIN 4.2   CBC:  Recent Labs  07/02/15 0505 07/03/15 0338 07/05/15 0318 07/07/15  WBC 9.1 8.9 16.1* 12.2  HGB 10.4* 11.1* 11.8* 10.8*  HCT 30.8* 32.6* 35.3* 32*  MCV 87.7 88.8 91.7  --   PLT 164 185 263 292   CBG:  Recent Labs  07/01/15 1530 07/01/15 2206 07/02/15 0809  GLUCAP 77 82 82     Dg Chest 2 View  07/03/2015  CLINICAL DATA:  Surgery for aortic aneurysm last Monday. Now with complaint of some congestion and productive cough as well is mild chest pain and shortness of breath with exertion. EXAM: CHEST  2 VIEW COMPARISON:  Chest x-rays dated 07/02/2015 in 06/2020 2017. FINDINGS: Continued improvement in aeration bilaterally. No evidence of significant edema on today's exam. Small pleural effusions are again seen bilaterally, with adjacent mild bibasilar atelectasis. Cardiomediastinal silhouette is stable in size and configuration. Right IJ sheath has been removed in the interval. Atherosclerotic changes are seen at the aortic arch. IMPRESSION: 1. Continued improvement in aeration bilaterally, suggesting improved fluid status. 2. Small bilateral pleural effusions, with probable mild bibasilar atelectasis. No evidence of pneumonia. 3. Aortic atherosclerosis. Electronically Signed   By: Bary RichardStan  Maynard M.D.   On: 07/03/2015 10:56   Dg Chest 2 View  06/24/2015  CLINICAL DATA:  Pre admit for repair of ascending aortic aneurysm, shortness of breath with exertion EXAM: CHEST  2 VIEW COMPARISON:  CT angio chest of 04/29/2015 and chest x-ray of 08/12/2006 FINDINGS: No active infiltrate or effusion is seen. On the frontal view the aortic knob is more prominent than on chest x-ray from 2008 indicating the fusiform aneurysmal dilatation noted by CT of the chest. No mediastinal or hilar adenopathy is seen. The heart is within upper limits of normal. No acute bony abnormality is noted. IMPRESSION:  1. No active infiltrate or effusion. 2. Prominence of the aortic knob consistent with fusiform aneurysmal dilatation noted on recent CT of the chest. Electronically Signed   By: Dwyane DeePaul  Barry M.D.   On: 06/24/2015 13:26   Dg Chest Port 1 View  07/02/2015  CLINICAL DATA:  Aortic arch aneurysm, aortic insufficiency, status post CABG, CHF. EXAM: PORTABLE CHEST 1 VIEW COMPARISON:  Portable chest x-ray of July 01, 2015 FINDINGS: The lungs are adequately inflated. Bilateral pleural effusions layer posteriorly. The cardiac silhouette remains enlarged. There remains widening of the mediastinum. The right internal jugular Cordis sheath demonstrates a stable kink but has its tip over the proximal SVC. The sternal wires are intact. IMPRESSION: Stable mild CHF with bilateral pleural effusions. Known ascending thoracic aortic aneurysm. Electronically Signed   By: David  SwazilandJordan M.D.   On: 07/02/2015 08:09   Dg Chest Port 1 View  07/01/2015  CLINICAL DATA:  Pleural effusion.  Shortness of breath . EXAM: PORTABLE CHEST 1 VIEW COMPARISON:  06/30/2015 . FINDINGS: Right IJ sheath in stable position. Prior median sternotomy. Cardiomegaly. Slightly increased bilateral pulmonary infiltrates/edema. Bilateral pleural effusions. Findings consistent with congestive heart failure. No pneumothorax. Surgical clips right axilla and upper chest. IMPRESSION: 1.  Right IJ sheath in stable position . 2. Prior median sternotomy. Cardiomegaly with interim slight increase in bilateral pulmonary infiltrates consistent with increasing pulmonary edema. Bilateral pleural effusions again noted. These findings are consistent with congestive heart failure. Electronically Signed   By: Maisie Fushomas  Register   On: 07/01/2015 07:15   Dg Chest Port 1 View  06/30/2015  CLINICAL DATA:  Chest soreness. Status post ascending aortic aneurysm repair 06/28/2015. EXAM: PORTABLE CHEST 1 VIEW COMPARISON:  Single-view of the chest 06/29/2015 and 06/28/2015. FINDINGS: Right  IJ approach Swan-Ganz catheter and mediastinal drains have been removed. Right IJ sheath remains in place. Bilateral pleural effusions are identified with basilar atelectasis, both increased since yesterday's examination. Heart size is mildly enlarged. Widening of the upper mediastinum is unchanged in appearance. IMPRESSION: Increased bilateral pleural effusions and basilar atelectasis. Status post removal of mediastinal drains and Swan-Ganz catheter. No pneumothorax. Electronically Signed   By: Drusilla Kannerhomas  Dalessio M.D.   On: 06/30/2015 08:00   Dg Chest Port 1 View  06/29/2015  CLINICAL DATA:  Status post repair aortic arch aneurysm EXAM: PORTABLE CHEST 1 VIEW COMPARISON:  Portable chest x-ray of June 28, 2015. FINDINGS: The lungs are adequately inflated. There is persistent increased density in the retrocardiac region on the left with further obscuration of the hemidiaphragm. Hazy increased density in the right infrahilar region has developed is well. The cardiac silhouette remains enlarged. The mediastinum remains mildly widened. Two mediastinal drains are in stable position. The Swan-Ganz catheter tip projects in the proximal main pulmonary artery trunk. The esophagogastric tube and endotracheal tube have been removed. IMPRESSION: Slight interval worsening of bibasilar atelectasis and probable trace left pleural effusion. The remaining support tubes are in reasonable position. Electronically Signed   By: David  [place]Jordan M.D.   On: 06/29/2015 07:28   [facility] [address]  [facility]2015  CLINICAL DATA:  Followup repair of aortic arch aneurysm EXAM: PORTABLE CHEST 1 VIEW COMPARISON:  06/24/2015 FINDINGS: Interval median sternotomy. Endotracheal tube has its tip 1.5 cm above the carina. Nasogastric tube has its tip in the stomach, esophagus being tortuous. Swan-Ganz catheter has its tip in the main pulmonary artery. Mediastinal drains are in place. Prominence of the superior mediastinum not unexpected following surgery  in that region. Near complete collapse of the left lower lobe. No pneumothorax. IMPRESSION: Lines and tubes well positioned. Near complete collapse of the left lower lobe. Electronically Signed   By: Paulina Fusi M.D.   On: 06/28/2015 15:28    ASSESSMENT/PLAN:  Physical deconditioning -  For Home health PT, OT and Skilled Nurse; fall precaution  Ascending and arch aneurysm S/P repair -  follow-up with Dr. Chrystie Nose, cardiology and Dr. Charlett Lango, cardiothoracic ; continue oxycodone 5 mg 1-2 tabs by mouth every 6 hours when necessary for pain; continue aspirin 325 mg EC 1 tab by mouth daily  Osteoporosis - continue Fosamax 70 mg 1 tab by mouth every Tuesdays  Hypothyroidism - continue levothyroxine 88 g 1 tab by mouth daily Lab Results  Component Value Date   TSH 2.87 06/01/2015   IBS - Continue dicyclomine 20 mg 1 tab by mouth twice a day  Hypertension - continue metoprolol tartrate 25 mg 1 tab by mouth twice a day, lisinopril 20 mg 1 tab by mouth daily  Depression - continue mirtazapine 45 mg 1 tab by mouth daily at bedtime  Hyperlipidemia - continue simvastatin 40 mg 1 tab by mouth every evening; check lipid panel  Insomnia - continue temazepam 15 mg 1 capsule by mouth daily at bedtime  Acute blood loss anemia - S/P blood transfusion; recently started on FeSO4 325 mg 1 tab PO daily; for repeat CBC in 1 week  Lab Results  Component Value Date   WBC 12.2 07/07/2015   HGB 10.8* 07/07/2015   HCT 32* 07/07/2015   MCV 91.7 07/05/2015   PLT 292 07/07/2015   Leukocytosis - no fever, no dysuria, no cough;  monitor Lab Results  Component Value Date   WBC 12.2 07/07/2015   Hypokalemia - resolved  Lab Results  Component Value Date   K 3.9 07/07/2015   Constipation - recently started on Colace 100 mg PO BID PRN      I have filled out patient's discharge paperwork and written prescriptions.  Patient will receive home health PT, OT and Skilled Nurse.  DME  provided: None  Total discharge time: Less than 30 minutes  Discharge time involved coordination of the discharge process with social worker, nursing staff and therapy department. Medical justification for home health services verified.      Kenard Gower, NP [employer] [phone]

## 2015-08-02 ENCOUNTER — Other Ambulatory Visit: Payer: Self-pay | Admitting: Thoracic Surgery (Cardiothoracic Vascular Surgery)

## 2015-08-02 DIAGNOSIS — I719 Aortic aneurysm of unspecified site, without rupture: Secondary | ICD-10-CM

## 2015-08-03 ENCOUNTER — Ambulatory Visit
Admission: RE | Admit: 2015-08-03 | Discharge: 2015-08-03 | Disposition: A | Discharge status: Home / Self Care | Payer: Medicare Other | Source: Ambulatory Visit | Attending: Thoracic Surgery (Cardiothoracic Vascular Surgery) | Admitting: Thoracic Surgery (Cardiothoracic Vascular Surgery)

## 2015-08-03 ENCOUNTER — Other Ambulatory Visit: Payer: Self-pay | Admitting: *Deleted

## 2015-08-03 ENCOUNTER — Ambulatory Visit (INDEPENDENT_AMBULATORY_CARE_PROVIDER_SITE_OTHER): Payer: Self-pay | Admitting: Thoracic Surgery (Cardiothoracic Vascular Surgery)

## 2015-08-03 VITALS — BP 130/60 | HR 54 | Resp 20 | Ht <= 58 in | Wt 113.0 lb

## 2015-08-03 DIAGNOSIS — I7121 Aneurysm of the ascending aorta, without rupture: Secondary | ICD-10-CM

## 2015-08-03 DIAGNOSIS — I719 Aortic aneurysm of unspecified site, without rupture: Secondary | ICD-10-CM

## 2015-08-03 DIAGNOSIS — I7122 Aneurysm of the aortic arch, without rupture: Secondary | ICD-10-CM

## 2015-08-03 DIAGNOSIS — I712 Thoracic aortic aneurysm, without rupture: Secondary | ICD-10-CM

## 2015-08-03 DIAGNOSIS — I351 Nonrheumatic aortic (valve) insufficiency: Secondary | ICD-10-CM

## 2015-08-03 DIAGNOSIS — Q969 Turner's syndrome, unspecified: Secondary | ICD-10-CM

## 2015-08-03 MED ORDER — OXYCODONE HCL 5 MG PO TABS: 5.0000 mg | ORAL_TABLET | Freq: Four times a day (QID) | ORAL | 0 refills | Status: DC | PRN

## 2015-08-03 NOTE — Progress Notes (Signed)
[address]       [place] [address]             [phone]       HPI: Mrs. Azurdia returns for a scheduled postoperative follow-up visit.   She is a 75 year old woman with history of Turner syndrome who had a repair of an ascending and arch aneurysm under deep hypothermic circulatory arrest on 06/28/2015. Postop course is relatively uncommon complicated [facility] postop day 7.  She now is back at home. She is not having any incisional pain. She does complain of some lower abdominal pain at night for which she is using oxycodone. Her exercise tolerance is improving she is anxious to increase her activities.  Past Medical History:  Diagnosis Date  . Acute blood loss anemia   . Anxiety   . Ascending aortic aneurysm (HCC)   . Depression   . Diverticulitis   . GERD (gastroesophageal reflux disease)   . Glaucoma   . Heart murmur   . History of hiatal hernia   . Hyperlipidemia   . Hypertension   . Hypokalemia   . Hypothyroidism   . IBS (irritable bowel syndrome)   . Insomnia   . Leukocytosis   . Osteoporosis   . Physical deconditioning   . Stomach problems   . Turner's syndrome   . Vision disorder   . Vitamin D deficiency       Current Outpatient Prescriptions  Medication Sig Dispense Refill  . alendronate (FOSAMAX) 70 MG tablet Take 70 mg by mouth every Tuesday.     . Ascorbic Acid (VITAMIN C) 1000 MG tablet Take 3,000 mg by mouth daily.     Marland Kitchen aspirin EC 325 MG EC tablet Take 1 tablet (325 mg total) by mouth daily.    . calcium-vitamin D (OSCAL WITH D) 500-200 MG-UNIT tablet Take 1 tablet by mouth daily with breakfast.    . Cholecalciferol (VITAMIN D) 2000 units CAPS Take 2,000 Units by mouth daily.     Marland Kitchen dicyclomine (BENTYL) 20 MG tablet Take 20 mg by mouth 2 (two) times daily.   0  . docusate sodium (COLACE) 100 MG capsule Take 100 mg by mouth 2 (two) times daily as needed for mild constipation.    .  dorzolamide (TRUSOPT) 2 % ophthalmic solution Place 1 drop into both eyes 2 (two) times daily.    Marland Kitchen levothyroxine (SYNTHROID, LEVOTHROID) 88 MCG tablet Take 88 mcg by mouth every morning.     Marland Kitchen lisinopril (PRINIVIL,ZESTRIL) 20 MG tablet Take 1 tablet (20 mg total) by mouth daily.    . metoprolol tartrate (LOPRESSOR) 25 MG tablet Take 1 tablet (25 mg total) by mouth 2 (two) times daily.    . mirtazapine (REMERON) 45 MG tablet Take 45 mg by mouth at bedtime.    . Multiple Vitamin (MULTIVITAMIN) tablet Take 1 tablet by mouth daily.    . simvastatin (ZOCOR) 40 MG tablet Take 40 mg by mouth every morning.     . temazepam (RESTORIL) 15 MG capsule Take 15 mg by mouth at bedtime.     Marland Kitchen oxyCODONE (OXY IR/ROXICODONE) 5 MG immediate release tablet Take 1-2 tablets (5-10 mg total) by mouth every 6 (six) hours as needed for severe pain. 40 tablet 0   No current facility-administered medications for this visit.     Physical Exam BP 130/60 (BP Location: Right Arm, Patient Position: Sitting, Cuff Size: Small)  Pulse (!) 54   Resp 20   Ht  (1.473 m)   Wt 113 lb (51.3 kg)   SpO2 98% Comment: RA  BMI 23.41 kg/m  75 year old woman in no acute distress Alert and oriented 3 with no focal deficits Lungs clear with equal vessels bilaterally Cardiac regular rate and rhythm with a 2/6 systolic murmur Sternum stable, incision healing well  Diagnostic Tests:  Chest x-ray shows postoperative changes.  Impression: 75 year old woman who is now 5 weeks out from repair of an ascending and arch aneurysm. She is doing extremely well.  Her heart rate is mildly bradycardic, but she is asymptomatic from that.  She requested another prescription for oxycodone which she is using for her abdominal discomfort. I did give her that prescription but recommended that she stop using it after that prescription is complete.  She should not lift anything over 10 pounds for least another week.  She may begin  driving.  Her precautions were discussed.  Plan: I'll plan to see her back in one year with a CT of the chest abdomen and pelvis to check on status of her aorta.  Loreli Slot, MD [facility] [phone]

## 2015-08-10 ENCOUNTER — Encounter (HOSPITAL_COMMUNITY): Payer: Self-pay | Admitting: *Deleted

## 2015-09-15 ENCOUNTER — Other Ambulatory Visit: Payer: Self-pay | Admitting: Gastroenterology

## 2015-09-15 DIAGNOSIS — R1032 Left lower quadrant pain: Secondary | ICD-10-CM

## 2015-09-24 ENCOUNTER — Ambulatory Visit
Admission: RE | Admit: 2015-09-24 | Discharge: 2015-09-24 | Disposition: A | Discharge status: Home / Self Care | Payer: Medicare Other | Source: Ambulatory Visit | Attending: Gastroenterology | Admitting: Gastroenterology

## 2015-09-24 DIAGNOSIS — R1032 Left lower quadrant pain: Secondary | ICD-10-CM

## 2015-09-24 MED ORDER — IOPAMIDOL (ISOVUE-300) INJECTION 61%
75.0000 mL | Freq: Once | INTRAVENOUS | Status: AC | PRN
  Administered 2015-09-24: 75 mL via INTRAVENOUS

## 2015-11-04 ENCOUNTER — Other Ambulatory Visit: Payer: Self-pay | Admitting: Gastroenterology

## 2015-11-04 ENCOUNTER — Ambulatory Visit
Admission: RE | Admit: 2015-11-04 | Discharge: 2015-11-04 | Disposition: A | Discharge status: Home / Self Care | Payer: Medicare Other | Source: Ambulatory Visit | Attending: Gastroenterology | Admitting: Gastroenterology

## 2015-11-04 DIAGNOSIS — K5900 Constipation, unspecified: Secondary | ICD-10-CM

## 2016-06-23 ENCOUNTER — Other Ambulatory Visit: Payer: Self-pay | Admitting: Thoracic Surgery (Cardiothoracic Vascular Surgery)

## 2016-06-23 DIAGNOSIS — I7121 Aneurysm of the ascending aorta, without rupture: Secondary | ICD-10-CM

## 2016-06-23 DIAGNOSIS — I712 Thoracic aortic aneurysm, without rupture: Secondary | ICD-10-CM

## 2016-08-08 ENCOUNTER — Encounter: Payer: Self-pay | Admitting: Thoracic Surgery (Cardiothoracic Vascular Surgery)

## 2016-08-08 ENCOUNTER — Ambulatory Visit: Payer: Medicare Other | Admitting: Thoracic Surgery (Cardiothoracic Vascular Surgery)

## 2016-08-08 ENCOUNTER — Ambulatory Visit
Admission: RE | Admit: 2016-08-08 | Discharge: 2016-08-08 | Disposition: A | Discharge status: Home / Self Care | Payer: Medicare Other | Source: Ambulatory Visit | Attending: Thoracic Surgery (Cardiothoracic Vascular Surgery) | Admitting: Thoracic Surgery (Cardiothoracic Vascular Surgery)

## 2016-08-08 ENCOUNTER — Ambulatory Visit (INDEPENDENT_AMBULATORY_CARE_PROVIDER_SITE_OTHER): Payer: Medicare Other | Admitting: Thoracic Surgery (Cardiothoracic Vascular Surgery)

## 2016-08-08 ENCOUNTER — Other Ambulatory Visit: Payer: Medicare Other

## 2016-08-08 VITALS — BP 160/76 | HR 54 | Resp 16 | Ht <= 58 in | Wt 108.0 lb

## 2016-08-08 DIAGNOSIS — Z09 Encounter for follow-up examination after completed treatment for conditions other than malignant neoplasm: Secondary | ICD-10-CM | POA: Diagnosis not present

## 2016-08-08 DIAGNOSIS — I712 Thoracic aortic aneurysm, without rupture: Secondary | ICD-10-CM

## 2016-08-08 DIAGNOSIS — I7121 Aneurysm of the ascending aorta, without rupture: Secondary | ICD-10-CM

## 2016-08-08 DIAGNOSIS — Q969 Turner's syndrome, unspecified: Secondary | ICD-10-CM

## 2016-08-08 DIAGNOSIS — I7122 Aneurysm of the aortic arch, without rupture: Secondary | ICD-10-CM

## 2016-08-08 NOTE — Progress Notes (Signed)
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 She had an echocardiogram in 2013 which showed moderate aortic sufficiency. On a repeat echo in 2017 her ascending aorta was noted to be dilated. 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 She underwent repair of the ascending aortic and arch aneurysms with deep hypothermic circulatory arrest on 06/28/2015. She was discharged to a skilled nursing facility on postoperative day 7.  I last saw her in the office at the end of July 2017. By that point she was back at home and was doing well.  She says she feels well. She denies any chest pain, pressure, or tightness. She still gets short of breath with exertion, but that is unchanged.  Past Medical History:  Diagnosis Date  . Acute blood loss anemia   . Anxiety   . Ascending aortic aneurysm (HCC)   . Depression   . Diverticulitis   . GERD (gastroesophageal reflux disease)   . Glaucoma   . Heart murmur   . History of hiatal hernia   . Hyperlipidemia   . Hypertension   . Hypokalemia   . Hypothyroidism   . IBS (irritable bowel syndrome)   . Insomnia   . Leukocytosis   . Osteoporosis   . Physical deconditioning   . Stomach problems   . Turner's syndrome   . Vision disorder   . Vitamin D deficiency      Current Outpatient Prescriptions  Medication Sig Dispense Refill  . alendronate (FOSAMAX) 70 MG tablet Take 70 mg by mouth every Tuesday.     . Ascorbic Acid (VITAMIN C) 1000 MG tablet Take 3,000 mg by mouth daily.     Marland Kitchen. aspirin EC 325 MG EC tablet Take 1 tablet (325 mg total) by mouth daily. (Patient taking  differently: Take 81 mg by mouth daily. )    . calcium-vitamin D (OSCAL WITH D) 500-200 MG-UNIT tablet Take 1 tablet by mouth daily with breakfast.    . Cholecalciferol (VITAMIN D) 2000 units CAPS Take 2,000 Units by mouth daily.     . dorzolamide (TRUSOPT) 2 % ophthalmic solution Place 1 drop into both eyes 2 (two) times daily.    Marland Kitchen. levothyroxine (SYNTHROID, LEVOTHROID) 88 MCG tablet Take 88 mcg by mouth every morning.     Marland Kitchen. lisinopril (PRINIVIL,ZESTRIL) 20 MG tablet Take 1 tablet (20 mg total) by mouth daily.    . metoprolol tartrate (LOPRESSOR) 25 MG tablet Take 1 tablet (25 mg total) by mouth 2 (two) times daily.    . mirtazapine (REMERON) 45 MG tablet Take 45 mg by mouth at bedtime.    . Multiple Vitamin (MULTIVITAMIN) tablet Take 1 tablet by mouth daily.    . psyllium (METAMUCIL) 58.6 % powder Take 1 packet by mouth.    . simvastatin (ZOCOR) 40 MG tablet Take 40 mg by mouth every morning.     . temazepam (RESTORIL) 15 MG capsule Take 15 mg by mouth at bedtime.      No current facility-administered medications for this visit.     Physical Exam BP Marland Kitchen(!)  160/76 (BP Location: Left Arm, Patient Position: Sitting, Cuff Size: Normal)   Pulse (!) 54   Resp 16   Ht 4\' 10"  (1.473 m)   Wt 108 lb (49 kg)   SpO2 99%   BMI 22.57 kg/m  Alert and oriented 3 with no focal deficits. Heart appearing Carotid bruits bilaterally Cardiac regular rate and rhythm normal S1 and S2 with no audible murmur Lungs clear with equal breath sounds bilaterally Sternal incision well-healed  Diagnostic Tests: CT CHEST FINDINGS  Cardiovascular: Patient is status post repair of a complex fusiform aneurysm involving the ascending aorta and transverse aorta. There has been revision of the major branch vessels as well. No new recurrent aneurysm, mediastinal hemorrhage or hematoma. Normal heart size. No pericardial effusion. Exam is limited without IV contrast because of elevated creatinine. Native coronary  atherosclerosis noted. Descending thoracic aorta normal in caliber and mildly tortuous.  Mediastinum/Nodes: No adenopathy. Scattered nonenlarged mediastinal and hilar lymph nodes. Trachea and esophagus unremarkable by noncontrast imaging. Stable residual right inferior thyroid nodule extends substernal measuring 18 mm.  Lungs/Pleura: Similar minor basilar atelectasis versus scarring. No acute airspace process, collapse or consolidation. No interstitial disease or edema. No suspicious pulmonary nodule or mass. Trachea and central airways remain patent. No pleural abnormality, pleural effusion, or pneumothorax.  Musculoskeletal: Degenerative changes of the spine with minor associated scoliosis. Remote median sternotomy. No acute osseous finding or severe compression fracture.  CT ABDOMEN PELVIS FINDINGS  Hepatobiliary: No definite focal hepatic abnormality or biliary dilatation by noncontrast imaging. Small incidental layering gallstones, image 118. Biliary tree is nondilated.  Pancreas: Unremarkable. No pancreatic ductal dilatation or surrounding inflammatory changes.  Spleen: Stable peripheral calcifications and lobular contour were. No acute process.  Adrenals/Urinary Tract: Normal adrenal glands. Horseshoe kidney again demonstrated chronic mild right pelviectasis.  Stomach/Bowel: Postop changes along the duodenum with multiple surgical clips. Negative for bowel obstruction, significant dilatation, ileus, or free air. Scattered colonic diverticulosis. No acute inflammatory process, fluid collection, or abscess. No free air.  Vascular/Lymphatic: Abdominal aortic atherosclerosis with tortuosity but no aneurysm. No retroperitoneal hemorrhage or hematoma. Exam is limited without IV contrast.  Reproductive: Uterus is atrophic with calcifications. No adnexal mass. No pelvic free fluid.  Other: No inguinal abnormality. Small fat containing umbilical hernia noted  as before.  Musculoskeletal: Stable chronic sacral Tarlov cysts. Stable degenerative changes of thoracic and lumbar spine with associated scoliosis. No acute compression fracture. No acute osseous finding.  IMPRESSION: Interval operative repair of the thoracic aortic aneurysm involving the ascending thoracic aorta and the transverse aorta. Limited assessment without IV contrast because of an elevated creatinine. No mediastinal hemorrhage or hematoma. No pericardial effusion.  Native coronary atherosclerosis.  No acute intrathoracic finding.  No acute intra-abdominal or pelvic finding by noncontrast CT.  Incidental cholelithiasis.  No evidence of abdominal aortic aneurysm. Aortoiliac atherosclerosis is present.  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  Shick M.D.   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 These were repaired under deep hypothermic circulatory arrest about a year ago. She really did quite well with the operation.  She feels well. She's not having any incisional chest pain or any other type of chest pain for that matter.  Ascending and arch  aneurysm- status post repair. No issues apparent on CT  Abdominal aortic atherosclerosis- severe, she is on a statin  Hypertension- poorly controlled her systolic pressure today was 160. I emphasized the importance of blood pressure control to her. She wishes to talk to Juluis RainierElizabeth Barnes about that issue when she sees her next month.  Plan: Follow-up with primary care scheduled  I will be happy to see Kristi Erickson back any time if I can be of any further assistance with her care  Loreli SlotSteven C Ericia Moxley, MD [facility] [phone]

## 2016-09-17 IMAGING — CR DG CHEST 1V PORT
1 series · 1 of 1 positions shown · non-contrast
Comparison: Portable chest x-ray June 28, 2015.

CLINICAL DATA: Status post repair aortic arch aneurysm

EXAM:
PORTABLE CHEST 1 VIEW

[AP]
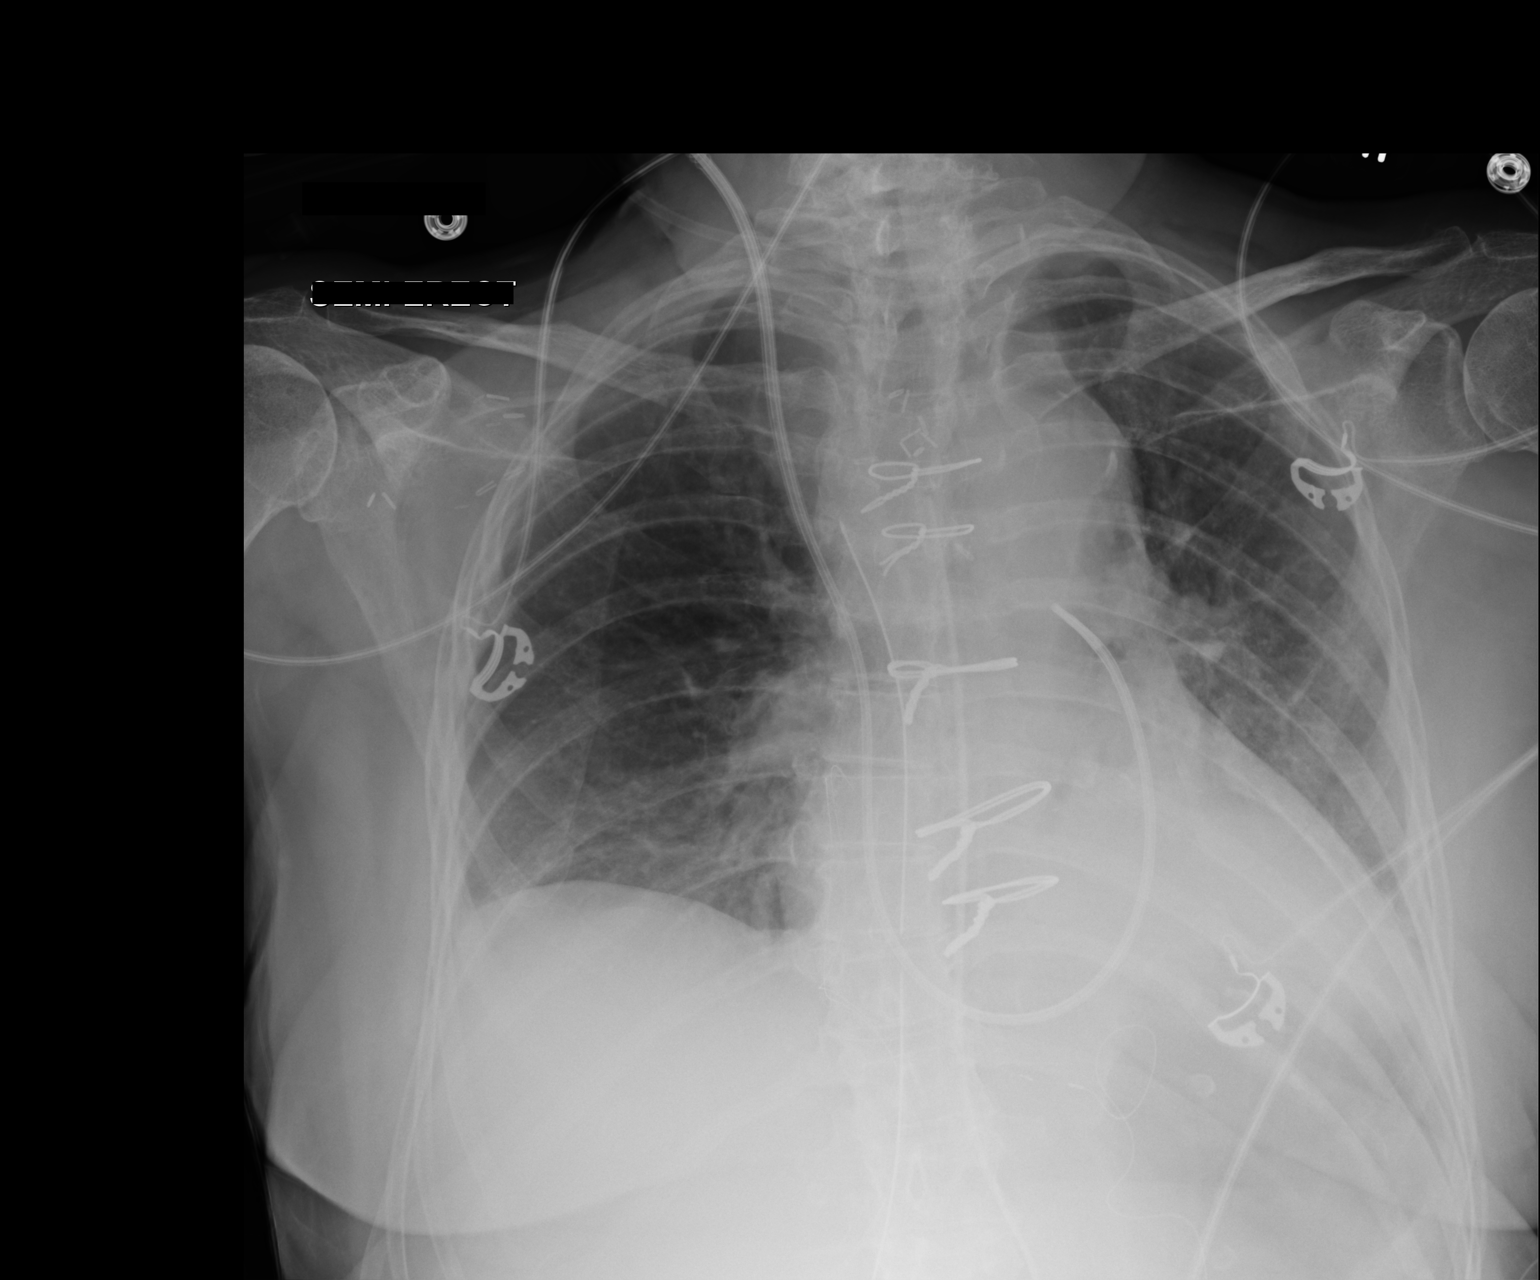

[1 of 1 positions shown; findings below may reference images not displayed]

FINDINGS: The lungs are adequately inflated. There is persistent increased
density in the retrocardiac region on the left with further
obscuration of the hemidiaphragm. Hazy increased density in the
right infrahilar region has developed is well. The cardiac
silhouette remains enlarged. The mediastinum remains mildly widened.
Two mediastinal drains are in stable position. The Swan-Ganz
catheter tip projects in the proximal main pulmonary artery trunk.
The esophagogastric tube and endotracheal tube have been removed.
IMPRESSION: Slight interval worsening of bibasilar atelectasis and probable
trace left pleural effusion. The remaining support tubes are in
reasonable position.

## 2016-09-19 IMAGING — CR DG CHEST 1V PORT
1 series · 1 of 1 positions shown · non-contrast
Comparison: 06/30/2015 .

CLINICAL DATA: Pleural effusion.  Shortness of breath .

EXAM:
PORTABLE CHEST 1 VIEW

[AP]
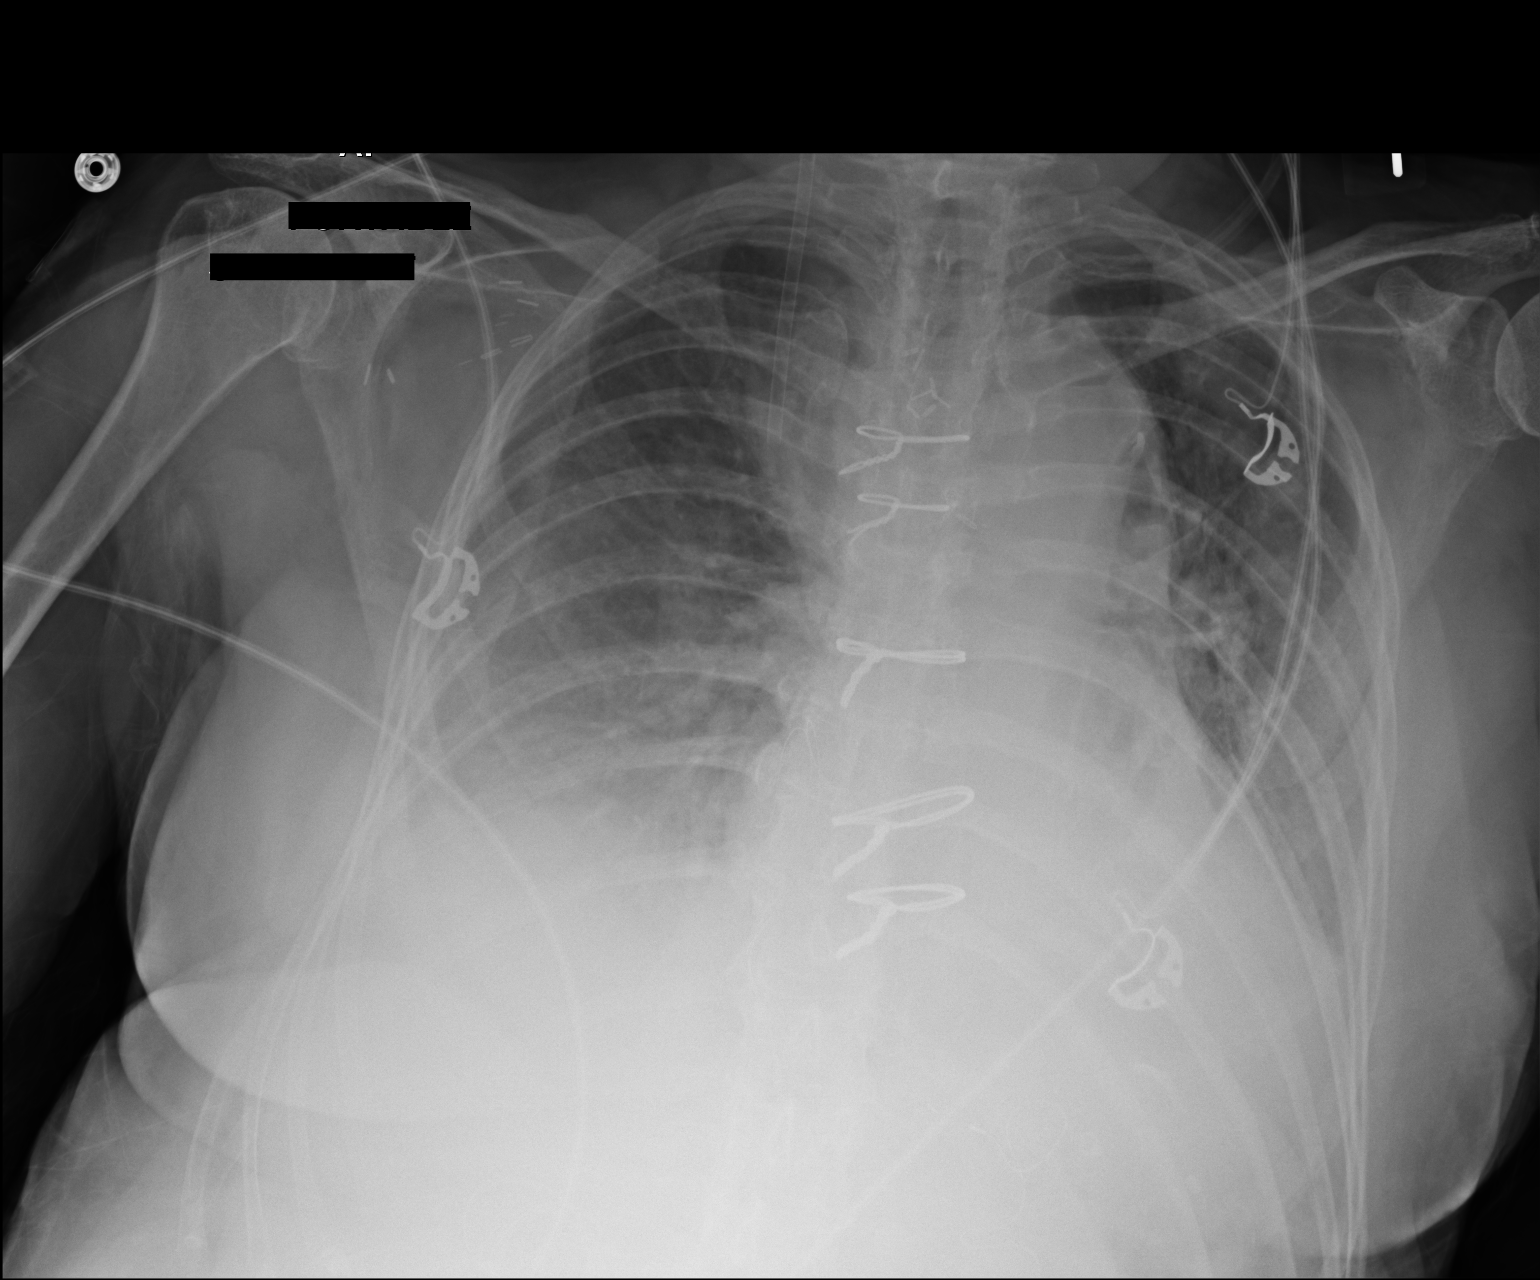

[1 of 1 positions shown; findings below may reference images not displayed]

FINDINGS: Right IJ sheath in stable position. Prior median sternotomy.
Cardiomegaly. Slightly increased bilateral pulmonary
infiltrates/edema. Bilateral pleural effusions. Findings consistent
with congestive heart failure. No pneumothorax. Surgical clips right
axilla and upper chest.
IMPRESSION: 1.  Right IJ sheath in stable position .

2. Prior median sternotomy. Cardiomegaly with interim slight
increase in bilateral pulmonary infiltrates consistent with
increasing pulmonary edema. Bilateral pleural effusions again noted.
These findings are consistent with congestive heart failure.

## 2016-09-21 IMAGING — CR DG CHEST 2V
2 series · 2 of 2 positions shown · non-contrast
Comparison: Chest x-rays dated 07/02/2015 in [REDACTED].

CLINICAL DATA: Surgery for aortic aneurysm last [REDACTED]. Now with
complaint of some congestion and productive cough as well is mild
chest pain and shortness of breath with exertion.

EXAM:
CHEST  2 VIEW

[chest pa]
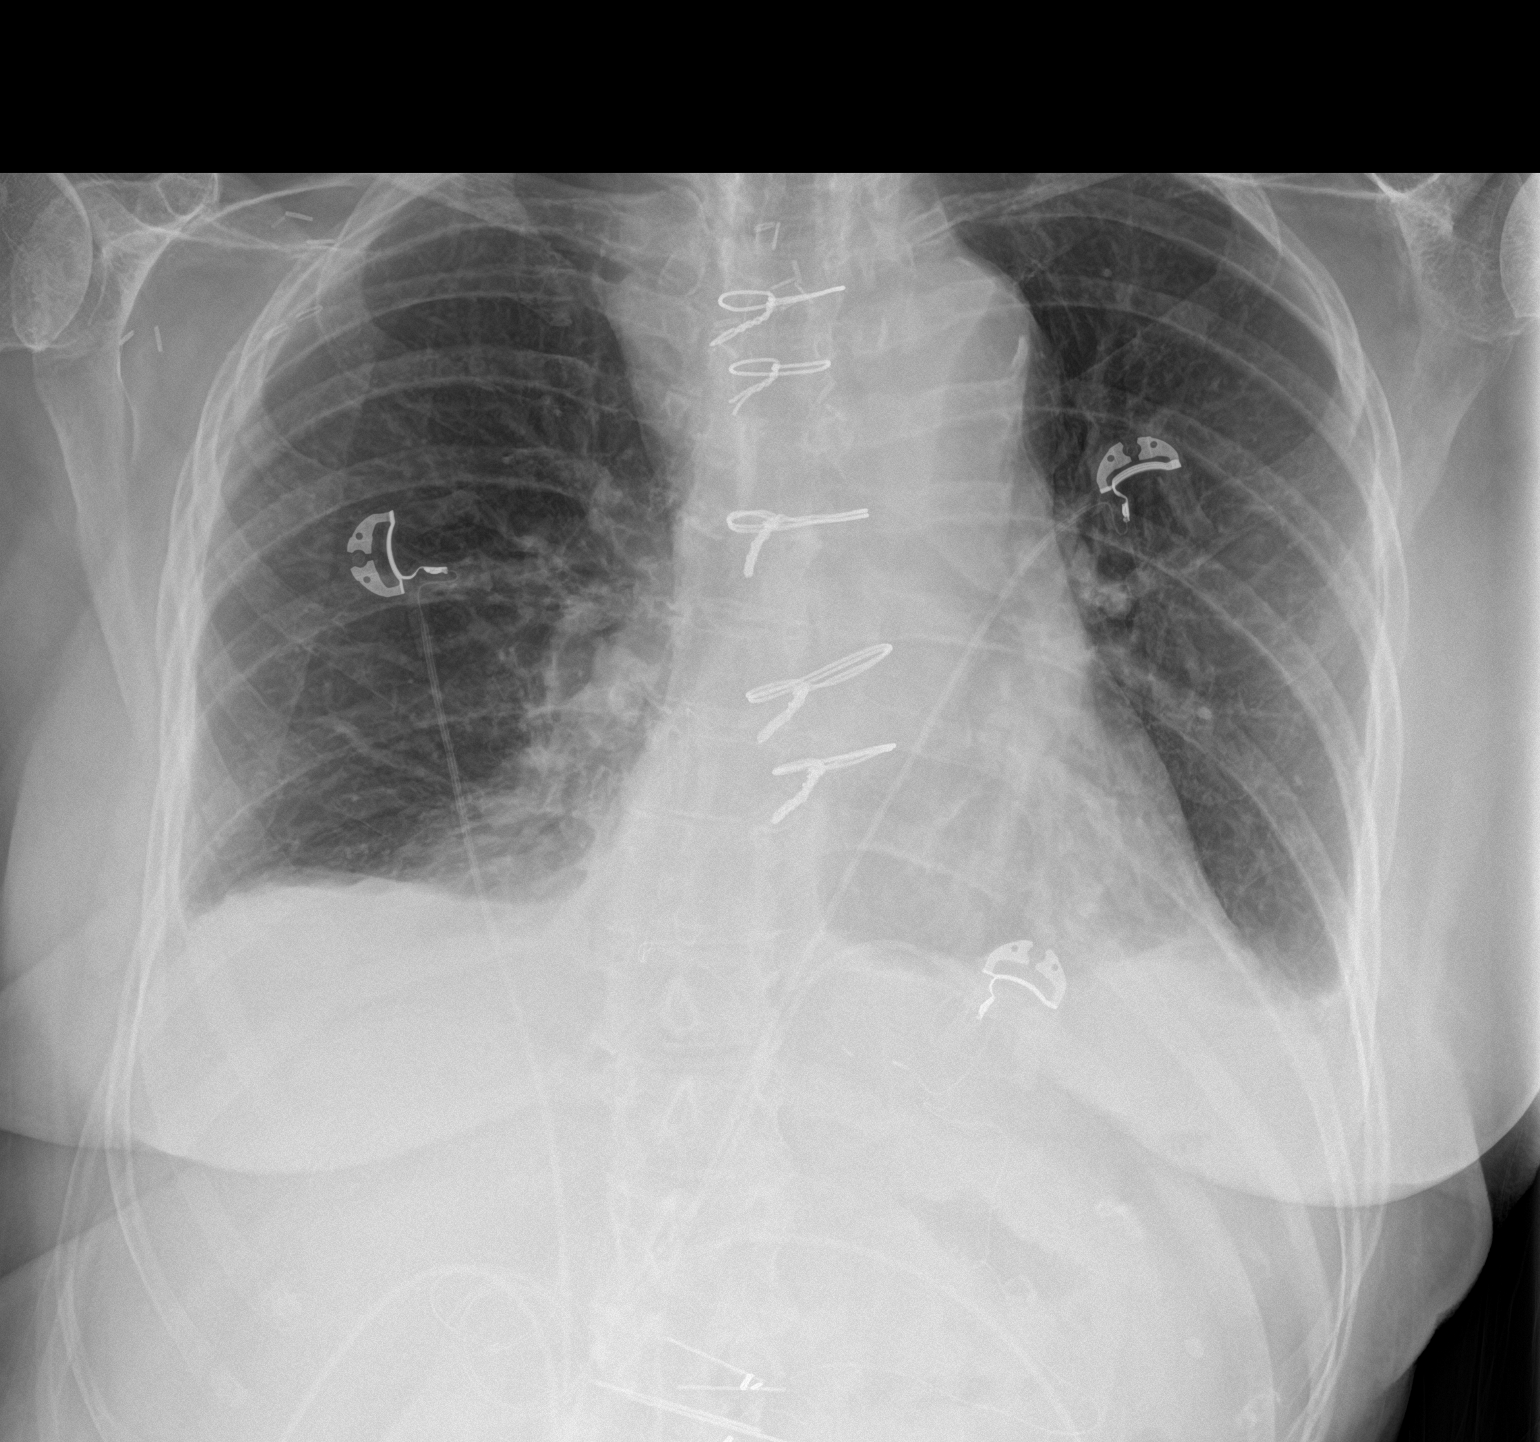

[chest lat]
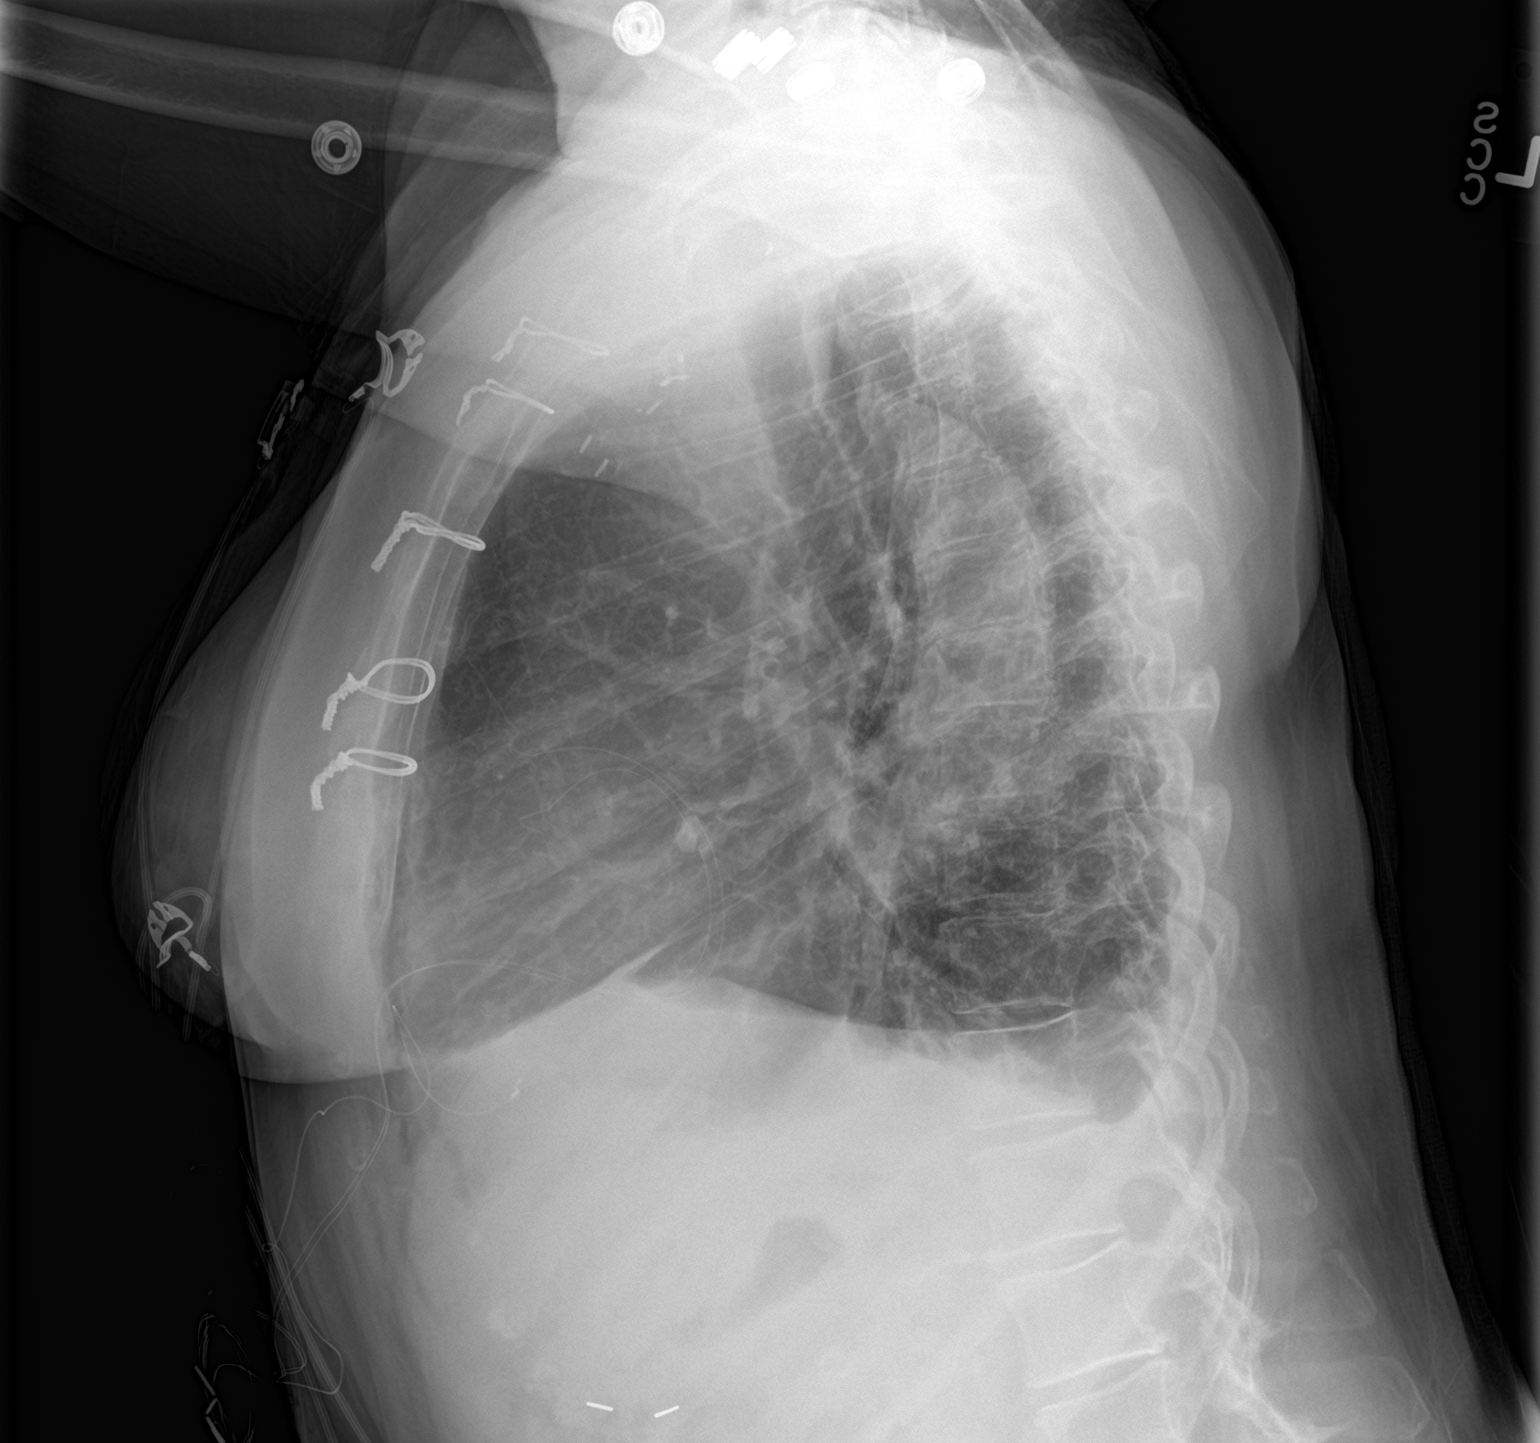

[2 of 2 positions shown; findings below may reference images not displayed]

FINDINGS: Continued improvement in aeration bilaterally. No evidence of
significant edema on today's exam. Small pleural effusions are again
seen bilaterally, with adjacent mild bibasilar atelectasis.
Cardiomediastinal silhouette is stable in size and configuration.
Right IJ sheath has been removed in the interval.

Atherosclerotic changes are seen at the aortic arch.
IMPRESSION: 1. Continued improvement in aeration bilaterally, suggesting
improved fluid status.
2. Small bilateral pleural effusions, with probable mild bibasilar
atelectasis. No evidence of pneumonia.
3. Aortic atherosclerosis.

## 2016-12-25 ENCOUNTER — Ambulatory Visit: Payer: Medicare Other | Admitting: Internal Medicine

## 2016-12-25 ENCOUNTER — Encounter: Payer: Self-pay | Admitting: Internal Medicine

## 2016-12-25 DIAGNOSIS — M810 Age-related osteoporosis without current pathological fracture: Secondary | ICD-10-CM | POA: Insufficient documentation

## 2016-12-25 LAB — BASIC METABOLIC PANEL WITH GFR
BUN: 19 mg/dL (ref 7–25)
CO2: 27 mmol/L (ref 20–32)
Calcium: 9.4 mg/dL (ref 8.6–10.4)
Chloride: 97 mmol/L — ABNORMAL LOW (ref 98–110)
Creat: 0.69 mg/dL (ref 0.60–0.93)
GFR, Est African American: 99 mL/min/{1.73_m2} (ref >=60)
GFR, Est Non African American: 85 mL/min/{1.73_m2} (ref >=60)
Glucose, Bld: 96 mg/dL (ref 65–99)
Potassium: 4.3 mmol/L (ref 3.5–5.3)
Sodium: 132 mmol/L — ABNORMAL LOW (ref 135–146)

## 2016-12-25 LAB — VITAMIN D 25 HYDROXY (VIT D DEFICIENCY, FRACTURES): VITD: 48.7 ng/mL (ref 30.00–100.00)

## 2016-12-25 NOTE — Patient Instructions (Signed)
Please stop at the lab.  Please separate calcium into 600 mg 2x a day with meals.  Continue Vitamin D 2000 units daily.  How Can I Prevent Falls? Men and women with osteoporosis need to take care not to fall down. Falls can break bones. Some reasons people fall are: Poor vision  Poor balance  Certain diseases that affect how you walk  Some types of medicine, such as sleeping pills.  Some tips to help prevent falls outdoors are: Use a cane or walker  Wear rubber-soled shoes so you don't slip  Walk on grass when sidewalks are slippery  In winter, put salt or kitty litter on icy sidewalks.  Some ways to help prevent falls indoors are: Keep rooms free of clutter, especially on floors  Use plastic or carpet runners on slippery floors  Wear low-heeled shoes that provide good support  Do not walk in socks, stockings, or slippers  Be sure carpets and area rugs have skid-proof backs or are tacked to the floor  Be sure stairs are well lit and have rails on both sides  Put grab bars on bathroom walls near tub, shower, and toilet  Use a rubber bath mat in the shower or tub  Keep a flashlight next to your bed  Use a sturdy step stool with a handrail and wide steps  Add more lights in rooms (and night lights) Buy a cordless phone to keep with you so that you don't have to rush to the phone       when it rings and so that you can call for help if you fall.   (adapted from http://www.niams.Nightlife[employer]views.se)  Exercise for Strong Bones (from [place]) There are two types of exercises that are important for building and maintaining bone density:  weight-bearing and muscle-strengthening exercises. Weight-bearing Exercises These exercises include activities that make you move against gravity while staying upright. Weight-bearing exercises can be high-impact or low-impact. High-impact weight-bearing exercises help build bones and keep  them strong. If you have broken a bone due to osteoporosis or are at risk of breaking a bone, you may need to avoid high-impact exercises. If you're not sure, you should check with your healthcare provider. Examples of high-impact weight-bearing exercises are: . Dancing . Doing high-impact aerobics . Hiking . Jogging/running . Jumping Rope . Stair climbing . Tennis Low-impact weight-bearing exercises can also help keep bones strong and are a safe alternative if you cannot do high-impact exercises. Examples of low-impact weight-bearing exercises are: . Using elliptical training machines . Doing low-impact aerobics . Using stair-step machines . Fast walking on a treadmill or outside Muscle-Strengthening Exercises These exercises include activities where you move your body, a weight or some other resistance against gravity. They are also known as resistance exercises and include: . Lifting weights . Using elastic exercise bands . Using weight machines . Lifting your own body weight . Functional movements, such as standing and rising up on your toes Yoga and Pilates can also improve strength, balance and flexibility. However, certain positions may not be safe for people with osteoporosis or those at increased risk of broken bones. For example, exercises that have you bend forward may increase the chance of breaking a bone in the spine. A physical therapist should be able to help you learn which exercises are safe and appropriate for you. Non-Impact Exercises Non-impact exercises can help you to improve balance, posture and how well you move in everyday activities. These exercises can also help to increase  muscle strength and decrease the risk of falls and broken bones. Some of these exercises include: . Balance exercises that strengthen your legs and test your balance, such as Tai Chi, can decrease your risk of falls. . Posture exercises that improve your posture and reduce rounded or "sloping"  shoulders can help you decrease the chance of breaking a bone, especially in the spine. . Functional exercises that improve how well you move can help you with everyday activities and decrease your chance of falling and breaking a bone. For example, if you have trouble getting up from a chair or climbing stairs, you should do these activities as exercises. A physical therapist can teach you balance, posture and functional exercises. Starting a New Exercise Program If you haven't exercised regularly for a while, check with your healthcare provider before beginning a new exercise program-particularly if you have health problems such as heart disease, diabetes or high blood pressure. If you're at high risk of breaking a bone, you should work with a physical therapist to develop a safe exercise program. Once you have your healthcare provider's approval, start slowly. If you've already broken bones in the spine because of osteoporosis, be very careful to avoid activities that require reaching down, bending forward, rapid twisting motions, heavy lifting and those that increase your chance of a fall. As you get started, your muscles may feel sore for a day or two after you exercise. If soreness lasts longer, you may be working too hard and need to ease up. Exercises should be done in a pain-free range of motion. How Much Exercise Do You Need? Weight-bearing exercises 30 minutes on most days of the week. Do a 30-minutesession or multiple sessions spread out throughout the day. The benefits to your bones are the same.   Muscle-strengthening exercises Two to three days per week. If you don't have much time for strengthening/resistance training, do small amounts at a time. You can do just one body part each day. For example do arms one day, legs the next and trunk the next. You can also spread these exercises out during your normal day.  Balance, posture and functional exercises Every day or as often as needed. You  may want to focus on one area more than the others. If you have fallen or lose your balance, spend time doing balance exercises. If you are getting rounded shoulders, work more on posture exercises. If you have trouble climbing stairs or getting up from the couch, do more functional exercises. You can also perform these exercises at one time or spread them during your day. Work with a phyiscal therapist to learn the right exercises for you.   Please check out the following book:   Please return to see me as needed.

## 2016-12-25 NOTE — Progress Notes (Signed)
Patient ID: Kristi Erickson, female   DOB: 08-Mar-1940, 76 y.o.   MRN: [id number]    HPI  Kristi Mayleanor S Fricker is a 76 y.o.-year-old female, referred by her PCP, Dr. Zachery DauerBarnes, for management of osteoporosis.  Pt was dx with OP in 07/2016  I reviewed pt's DEXA scans: Date L1-L4 T score FN T score 33% distal L Radius  07/25/2016 [facility]) n/a RFN: -1.7 LFN: -2.9 -1.5  07/02/2014 ([facility]) n/a RFN: -1.3 LFN: -1.8 -1.7  06/20/2012 ([facility]) n/a RFN: -0.7 LFN: -1.9  -1.7   She denies fractures. + occasional  Dizziness/vertigo. Noorthostasis/poor vision. No falls.   Previous OP treatments:  - Fosamax  - for 5 years up to 02/2012 per chart (but not taking it correctlyy per her recall) but she confused about when she started it again >> 6-8 mo ago.  No h/o vitamin D deficiency. Reviewed available vit D levels: 03/03/2016: Vitamin D 51.7 No results found for: VD25OH  Pt is on calcium - 1200 mg daily at one time; on vitamin D supplement - increased 3-4 weeks ago to 2000 units. She also takes a MVI. Also takes vit C.   No weight bearing exercises. She stays active.  She does not take high vitamin A doses.  She has a slight hyponatremia hx (chronic).  She was on Premarin initially and later on Estrogen >> stopped in her 7560s.  Pt does not have a FH of osteoporosis.  She has a h/o hypocalcemia in the hospital in 06/2015. No h/o kidney stones. 03/03/2016: Calcium-albumin corrected 9.1, normal Lab Results  Component Value Date   CALCIUM 8.7 (L) 07/05/2015   CALCIUM 8.6 (L) 07/03/2015   CALCIUM 7.8 (L) 07/02/2015   CALCIUM 7.2 (L) 07/01/2015   CALCIUM 7.2 (L) 06/30/2015   CALCIUM 7.2 (L) 06/29/2015   CALCIUM 10.1 06/24/2015   CALCIUM 8.8 06/01/2015   CALCIUM 9.2 10/11/2009   CALCIUM 9.1 08/12/2006   No h/o thyrotoxicosis. Reviewed TSH recent levels:  03/03/2016: TSH 1.48  Lab Results  Component Value Date   TSH 2.87 06/01/2015   No h/o CKD. Last BUN/Cr: 03/03/2016:  BUN/creatinine 20/0.69 Lab Results  Component Value Date   BUN 12 07/07/2015   CREATININE 0.6 07/07/2015   She has an interesting history of Turner syndrome.  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  ROS: Constitutional: no weight gain/loss, + fatigue, no subjective hyperthermia/hypothermia Eyes: + Blurry vision, no xerophthalmia ENT: no sore throat, no nodules palpated in throat, no dysphagia/odynophagia, no hoarseness, + hypoacusis Cardiovascular: no CP/+ SOB/no palpitations/leg swelling Respiratory: no cough/+ SOB Gastrointestinal: no N/V/+ D/ + C Musculoskeletal: no muscle/+ joint aches Skin: no rashes Neurological: no tremors/numbness/tingling/dizziness Psychiatric: no depression/anxiety  Past Medical History:  Diagnosis Date  . Acute blood loss anemia   . Anxiety   . Ascending aortic aneurysm (HCC)   . Depression   . Diverticulitis   . GERD (gastroesophageal reflux disease)   . Glaucoma   . Heart murmur   . History of hiatal hernia   . Hyperlipidemia   . Hypertension   . Hypokalemia   . Hypothyroidism   . IBS (irritable bowel syndrome)   . Insomnia   . Leukocytosis   . Osteoporosis   . Physical deconditioning   . Stomach problems   . Turner's syndrome   . Vision disorder   . Vitamin D deficiency    Past Surgical History:  Procedure Laterality Date  . ABDOMINAL SURGERY  1995  1970s  cyst from abdomen removed  . APPENDECTOMY    . CARDIAC CATHETERIZATION N/A 06/03/2015   Procedure: Left Heart Cath and Coronary Angiography;  Surgeon: Corky Crafts, MD;  [facility] INVASIVE CV LAB;  Service: Cardiovascular;  Laterality: N/A;  . CATARACT EXTRACTION W/ INTRAOCULAR LENS  IMPLANT, BILATERAL    . EYE SURGERY     left eye muscle     age 74  . GLAUCOMA VALVE INSERTION     right eye  . OTHER SURGICAL HISTORY  2010    ear surgery  . REPLACEMENT ASCENDING AORTA N/A 06/28/2015   Procedure: REPLACEMENT ASCENDING AORTA AND  ARCH;  Surgeon: Loreli Slot, MD;  Location: [facility] OR;  Service: Open Heart Surgery;  Laterality: N/A;  . TEE WITHOUT CARDIOVERSION N/A 06/28/2015   Procedure: TRANSESOPHAGEAL ECHOCARDIOGRAM (TEE);  Surgeon: Loreli Slot, MD;  Location: [facility] OR;  Service: Open Heart Surgery;  Laterality: N/A;  . TONSILLECTOMY     t+a  . TRANSTHORACIC ECHOCARDIOGRAM  08/2011   EF=/> 55%, LA mildly dilated, midl mitral annular calcification, trace MR, trace TR, mild AV calcification w/mod regurg, trace pulm valve regurg   Social History   Socioeconomic History  . Marital status: Single    Spouse name: Not on file  . Number of children: 0  . Years of education: M.A.  Social Needs  Occupational History  . retired  Tobacco Use  . Smoking status: Former Smoker    Years: 3.00  . Smokeless tobacco: Never Used  Substance and Sexual Activity  . Alcohol use: Yes    Alcohol/week: 0.0 oz    Comment: 1-2 glasses of wine/week  . Drug use: No   Current Outpatient Medications on File Prior to Visit  Medication Sig Dispense Refill  . alendronate (FOSAMAX) 70 MG tablet Take 70 mg by mouth every Tuesday.     . Ascorbic Acid (VITAMIN C) 1000 MG tablet Take 3,000 mg by mouth daily.     Marland Kitchen aspirin EC 325 MG EC tablet Take 1 tablet (325 mg total) by mouth daily. (Patient taking differently: Take 81 mg by mouth daily. )    . calcium-vitamin D (OSCAL WITH D) 500-200 MG-UNIT tablet Take 1 tablet by mouth daily with breakfast.    . Cholecalciferol (VITAMIN D) 2000 units CAPS Take 2,000 Units by mouth daily.     . dorzolamide (TRUSOPT) 2 % ophthalmic solution Place 1 drop into both eyes 2 (two) times daily.    Marland Kitchen levothyroxine (SYNTHROID, LEVOTHROID) 88 MCG tablet Take 88 mcg by mouth every morning.     Marland Kitchen lisinopril (PRINIVIL,ZESTRIL) 20 MG tablet Take 1 tablet (20 mg total) by mouth daily.    . metoprolol tartrate (LOPRESSOR) 25 MG tablet Take 1 tablet (25 mg total) by mouth 2 (two) times daily.    . mirtazapine  (REMERON) 45 MG tablet Take 45 mg by mouth at bedtime.    . Multiple Vitamin (MULTIVITAMIN) tablet Take 1 tablet by mouth daily.    . psyllium (METAMUCIL) 58.6 % powder Take 1 packet by mouth.    . simvastatin (ZOCOR) 40 MG tablet Take 40 mg by mouth every morning.     . temazepam (RESTORIL) 15 MG capsule Take 15 mg by mouth at bedtime.      No current facility-administered medications on file prior to visit.    Allergies  Allergen Reactions  . Remeron [Mirtazapine] Other (See Comments)    Other reaction(s): Unknown Did not work  . Shellfish-Derived  Products Nausea And Vomiting    Clam, oyster   Family History  Problem Relation Age of Onset  . Heart attack Father   . Heart attack Maternal Grandfather   . Skin cancer Brother     PE: BP 104/60   Pulse (!) 48   Ht 4\' 9"  (1.448 m)   Wt 113 lb 6.4 oz (51.4 kg)   SpO2 97%   BMI 24.54 kg/m  Wt Readings from Last 3 Encounters:  12/25/16 113 lb 6.4 oz (51.4 kg)  08/08/16 108 lb (49 kg)  08/03/15 113 lb (51.3 kg)   Constitutional: normal weight, in NAD. + accentuated scoliosis and walking bent over Eyes: PERRLA, EOMI, no exophthalmos ENT: moist mucous membranes, no thyromegaly, no cervical lymphadenopathy Cardiovascular: bradycardia, RR, No MRG Respiratory: CTA B Gastrointestinal: abdomen soft, NT, ND, BS+ Musculoskeletal: no deformities, strength intact in all 4 Skin: moist, warm, no rashes Neurological: no tremor with outstretched hands, DTR normal in all 4  Assessment: 1. Osteoporosis  Plan: 1. Osteoporosis - likely worsened after stopping estrogen HRT  - Discussed about increased risk of fracture, depending on the T score, greatly increased when the T score is lower than -2.5, but it is actually a continuum and -2.5 should not be regarded as an absolute threshold. We reviewed her last 2 DEXA scan reports together, and I explained that based on the T scores, she has an increased risk for fractures. The last 2 DXA scans  were done on different machines, but there is a clear decrease b/w the T-scores. - we reviewed her dietary and supplemental calcium and vitamin D intake. She gets 1200 mg of calcium daily from supplements but takes it all at once >> discussed that she is not going to absorb >600 mg at one time >> separated the dose in 2. I will check vit D today to see if she needs further supplementation. Currently on 2000 units daily but increased from 1000 units 3-4 weeks ago. - discussed fall precautions   - given handout from [facility] Re: weight bearing exercises - advised to do this every day or at least 5/7 days - we discussed about maintaining a good amount of protein in her diet. The recommended daily protein intake is ~0.8 g per kilogram per day (~40 g for her). I advised her to try to aim for this amount, since a diet low in proteins can exacerbate osteoporosis. Also, avoid smoking or >2 drinks of alcohol a day. - I recommended the following book:  - We discussed about the different medication classes, benefits and side effects (including atypical fractures and ONJ - no dental workup in progress or planned). I do not feel she is benefitting enough from her current Fosamax dose. I explained that my first choice would be sq denosumab (Prolia) for 6-10 years, then zoledronic acid (iv Reclast) for 1-2 years. I would use Teriparatide/Abaloparatide as a last resort. I explained the mechanism of action and expected benefits.  - Will check the following labs today: Orders Placed This Encounter  Procedures  . Vitamin D, 25-hydroxy  . BASIC METABOLIC PANEL WITH GFR  - if labs normal, she agrees to a Prolia inj >> she prefers to get this in PCPs office and to be followed by PCP  - I suggested to check a new DEXA scan in 2 years after starting Prolia -  I explained that the first indication that the treatment is working is her not having anymore fractures. DEXA scan changes  are secondary:  unchanged or slightly higher T-scores are desirable - will have her return to see me as needed  Component     Latest Ref Rng & Units 12/25/2016          Glucose     65 - 99 mg/dL 96  BUN     7 - 25 mg/dL 19  Creatinine     4.09 - 0.93 mg/dL 8.11  GFR, Est Non African American     > OR = 60 mL/min/1.22m2 85  GFR, Est African American     > OR = 60 mL/min/1.26m2 99  BUN/Creatinine Ratio     6 - 22 (calc) NOT APPLICABLE  Sodium     135 - 146 mmol/L 132 (L)  Potassium     3.5 - 5.3 mmol/L 4.3  Chloride     98 - 110 mmol/L 97 (L)  CO2     20 - 32 mmol/L 27  Calcium     8.6 - 10.4 mg/dL 9.4  VITD     91.47 - 829.56 ng/mL 48.70   Vitamin D level is normal.  Sodium slightly low.  GFR normal.  Calcium normal. She can go ahead with starting Prolia.  Carlus Pavlov, MD PhD [facility]

## 2017-10-10 ENCOUNTER — Ambulatory Visit: Payer: Medicare Other | Admitting: Internal Medicine

## 2017-10-10 ENCOUNTER — Encounter: Payer: Self-pay | Admitting: Internal Medicine

## 2017-10-10 VITALS — BP 154/62 | HR 42 | Ht <= 58 in | Wt 113.0 lb

## 2017-10-10 DIAGNOSIS — Z9889 Other specified postprocedural states: Secondary | ICD-10-CM | POA: Diagnosis not present

## 2017-10-10 DIAGNOSIS — R001 Bradycardia, unspecified: Secondary | ICD-10-CM | POA: Diagnosis not present

## 2017-10-10 DIAGNOSIS — I452 Bifascicular block: Secondary | ICD-10-CM | POA: Diagnosis not present

## 2017-10-10 DIAGNOSIS — Z8679 Personal history of other diseases of the circulatory system: Secondary | ICD-10-CM | POA: Diagnosis not present

## 2017-10-10 DIAGNOSIS — I351 Nonrheumatic aortic (valve) insufficiency: Secondary | ICD-10-CM

## 2017-10-10 NOTE — Progress Notes (Signed)
OFFICE NOTE  Chief Complaint:  Aortic Regurgitation follow-up  Primary Care Physician: Juluis Rainier, MD  HPI:  Kristi Erickson is a 77 y.o. female with a history of Aortic Regurgitation, HTN, HLD, Hypothyroidism, Turner's Syndrome, Horseshoe Kidney, GERD, Glaucoma. She presents today for follow up. She was seen about four years ago by Dr. Rennis Golden for aortic regurgitation. Echocardiogram 08/18/2011 revealed Normal LV function and parameters. Trileaflet Aortic Valve with Moderate Regurgitation and mild calcification. She did not have any anginal symptoms or shortness of breath at that time. She has continued to be treated for her other chronic medical problems in the interval since her visit then. She maintains good control of her blood pressure with Quinipril and Diltiazem, she is being treated for Hyperlipidemia with Simvastatin. She is also treated for hypothyroidism.   She has not had any development of new chest pain at rest or with exertion, she has not had any light headedness with exertion or presyncope, she does not have any shortness of breath at rest. She will get short of breath after walking 2 blocks with recovery in 1-2 minutes. She is able to climb 2 flights of stairs without difficulty. She has not had any lower extremity edema. She takes her medications daily and continues to follow with her PCP Dr. Zachery Dauer. Dr. Zachery Dauer referred her back here for follow up and echocardiography monitoring.   05/10/2015  Kristi Erickson returns today for follow-up. She was sent for an echocardiogram which demonstrated an LVEF of 60-65% with mild to moderate AI and a dilated aortic root measuring between 4.8 and 5.1 cm. Based on these findings I recommended a CT aortogram which she had on 04/29/2015. This demonstrated aneurysm of the ascending aorta, measuring up to 5.3 cm. I reviewed the results and had already referred her to thoracic surgery prior to this appointment. We again reviewed those results  in the office today.  10/10/2017  Kristi Erickson returns today for follow-up.  She was recently seen by Dr. Zachery Dauer to send her back for ongoing follow-up.  She is noted to have a persistent diastolic murmur.  As noted she had a history of aortic aneurysm and underwent aortic root replacement with resuspension of the native valve.  As evidenced by TEE after the procedure, she was noted to have mild to moderate AI that persisted.  Since the procedure she reports symptoms mostly of fatigue.  She is had persistently a low heart rate.  In December heart rate was in the upper 40s and today is 42.  EKG shows a marked sinus bradycardia with bifascicular block suggesting underlying intraventricular conduction delay.  She is on metoprolol.  She denies any chest pain or significant worsening shortness of breath.  PMHx:  Past Medical History:  Diagnosis Date  . Acute blood loss anemia   . Anxiety   . Ascending aortic aneurysm (HCC)   . Depression   . Diverticulitis   . GERD (gastroesophageal reflux disease)   . Glaucoma   . Heart murmur   . History of hiatal hernia   . Hyperlipidemia   . Hypertension   . Hypokalemia   . Hypothyroidism   . IBS (irritable bowel syndrome)   . Insomnia   . Leukocytosis   . Osteoporosis   . Physical deconditioning   . Stomach problems   . Turner's syndrome   . Vision disorder   . Vitamin D deficiency     Past Surgical History:  Procedure Laterality Date  . ABDOMINAL SURGERY  1995   1970s  cyst from abdomen removed  . APPENDECTOMY    . CARDIAC CATHETERIZATION N/A 06/03/2015   Procedure: Left Heart Cath and Coronary Angiography;  Surgeon: Corky Crafts, MD;  Location: [facility] INVASIVE CV LAB;  Service: Cardiovascular;  Laterality: N/A;  . CATARACT EXTRACTION W/ INTRAOCULAR LENS  IMPLANT, BILATERAL    . EYE SURGERY     left eye muscle     age 75  . GLAUCOMA VALVE INSERTION     right eye  . OTHER SURGICAL HISTORY  2010    ear surgery  . REPLACEMENT ASCENDING  AORTA N/A 06/28/2015   Procedure: REPLACEMENT ASCENDING AORTA AND ARCH;  Surgeon: Loreli Slot, MD;  Location: [facility] OR;  Service: Open Heart Surgery;  Laterality: N/A;  . TEE WITHOUT CARDIOVERSION N/A 06/28/2015   Procedure: TRANSESOPHAGEAL ECHOCARDIOGRAM (TEE);  Surgeon: Loreli Slot, MD;  Location: [facility] OR;  Service: Open Heart Surgery;  Laterality: N/A;  . TONSILLECTOMY     t+a  . TRANSTHORACIC ECHOCARDIOGRAM  08/2011   EF=/> 55%, LA mildly dilated, midl mitral annular calcification, trace MR, trace TR, mild AV calcification w/mod regurg, trace pulm valve regurg    FAMHx:  Family History  Problem Relation Age of Onset  . Heart attack Father   . Heart attack Maternal Grandfather   . Skin cancer Brother     SOCHx:   reports that she has quit smoking. She quit after 3.00 years of use. She has never used smokeless tobacco. She reports that she drinks alcohol. She reports that she does not use drugs.  ALLERGIES:  Allergies  Allergen Reactions  . Remeron [Mirtazapine] Other (See Comments)    Other reaction(s): Unknown Did not work  . Shellfish-Derived Products Nausea And Vomiting    Clam, oyster    ROS: Pertinent items noted in HPI and remainder of comprehensive ROS otherwise negative.  HOME MEDS: Current Outpatient Medications  Medication Sig Dispense Refill  . alendronate (FOSAMAX) 70 MG tablet Take 70 mg by mouth every Tuesday.     . Ascorbic Acid (VITAMIN C) 1000 MG tablet Take 3,000 mg by mouth daily.     Marland Kitchen aspirin EC 325 MG EC tablet Take 1 tablet (325 mg total) by mouth daily. (Patient taking differently: Take 81 mg by mouth daily. )    . calcium-vitamin D (OSCAL WITH D) 500-200 MG-UNIT tablet Take 1 tablet by mouth daily with breakfast.    . Cholecalciferol (VITAMIN D) 2000 units CAPS Take 2,000 Units by mouth daily.     . dorzolamide (TRUSOPT) 2 % ophthalmic solution Place 1 drop into both eyes 2 (two) times daily.    Marland Kitchen levothyroxine (SYNTHROID, LEVOTHROID)  88 MCG tablet Take 88 mcg by mouth every morning.     Marland Kitchen lisinopril (PRINIVIL,ZESTRIL) 20 MG tablet Take 1 tablet (20 mg total) by mouth daily.    . metoprolol tartrate (LOPRESSOR) 25 MG tablet Take 12.5 mg by mouth 2 (two) times daily.    . mirtazapine (REMERON) 45 MG tablet Take 45 mg by mouth at bedtime.    . Multiple Vitamin (MULTIVITAMIN) tablet Take 1 tablet by mouth daily.    . psyllium (METAMUCIL) 58.6 % powder Take 1 packet by mouth.    . simvastatin (ZOCOR) 40 MG tablet Take 40 mg by mouth every morning.     . temazepam (RESTORIL) 15 MG capsule Take 15 mg by mouth at bedtime.      No current facility-administered medications for this visit.  LABS/IMAGING: No results found for this or any previous visit (from the past 48 hour(s)). No results found.  WEIGHTS: Wt Readings from Last 3 Encounters:  10/10/17 113 lb (51.3 kg)  12/25/16 113 lb 6.4 oz (51.4 kg)  08/08/16 108 lb (49 kg)    VITALS: BP (!) 154/62   Pulse (!) 42   Ht 4\' 8"  (1.422 m)   Wt 113 lb (51.3 kg)   BMI 25.33 kg/m   EXAM: Deferred  EKG: Marked sinus bradycardia at 42, bifascicular block-personally reviewed  ASSESSMENT: 1. Symptomatic bradycardia 2. Bifascicular block 3. Aortic Regurgitation - mild to moderate 4. Ascending aortic aneurysm s/p root replacement and resuspension of the native valve (2017) 5. HTN 6. HLD 7. Hypothyroidism   PLAN: 1. Ms. Erickson is complaining of persistent fatigue and is had bradycardia with underlying bifascicular block.  The conduction delay may be related to her Turner's syndrome and/or aortic aneurysm with root replacement.  She is on beta-blocker which I would recommend decreasing to 12.5 mg twice daily.  Ultimately we may have to discontinue this.  She is scheduled to follow-up with her PCP in the near future.  We will plan a repeat echo she has not had a repeat study since 2017 to assess the degree of aortic insufficiency.  As mentioned she did have persistent  mild to moderate aortic insufficiency which was noted even after surgery to replace her aortic aneurysm.  Follow-up with me after the echo.  Chrystie Nose, MD, [facility], FACP  [place]  [facility]  Medical Director of the [facility]  [facility] Diplomate of the American Board of Clinical Lipidology Attending Cardiologist  Direct Dial: [phone]  Fax: [phone]  Website:  [email or web address] [email or web address] 10/10/2017, 12:23 PM

## 2017-10-10 NOTE — Patient Instructions (Signed)
Medication Instructions:   DECREASE metoprolol tartrate to 12.5mg  twice daily  Labwork:  NONE  Testing/Procedures:  Your physician has requested that you have an echocardiogram. Echocardiography is a painless test that uses sound waves to create images of your heart. It provides your doctor with information about the size and shape of your heart and how well your heart's chambers and valves are working. This procedure takes approximately one hour. There are no restrictions for this procedure. -- done at [address] - [address]  Follow-Up:  Your physician wants you to follow-up in about 6 weeks with Dr. Rennis Golden (after echo)   If you need a refill on your cardiac medications before your next appointment, please call your pharmacy.  Any Other Special Instructions Will Be Listed Below (If Applicable).

## 2017-10-22 ENCOUNTER — Ambulatory Visit (HOSPITAL_COMMUNITY): Payer: Medicare Other | Attending: Cardiology

## 2017-10-22 ENCOUNTER — Other Ambulatory Visit: Payer: Self-pay

## 2017-10-22 DIAGNOSIS — Z8679 Personal history of other diseases of the circulatory system: Secondary | ICD-10-CM | POA: Insufficient documentation

## 2017-10-22 DIAGNOSIS — Z9889 Other specified postprocedural states: Secondary | ICD-10-CM | POA: Insufficient documentation

## 2017-10-22 DIAGNOSIS — I351 Nonrheumatic aortic (valve) insufficiency: Secondary | ICD-10-CM

## 2017-12-04 ENCOUNTER — Encounter: Payer: Self-pay | Admitting: Internal Medicine

## 2017-12-04 ENCOUNTER — Ambulatory Visit: Payer: Medicare Other | Admitting: Internal Medicine

## 2017-12-04 VITALS — BP 142/60 | HR 57 | Ht <= 58 in | Wt 116.0 lb

## 2017-12-04 DIAGNOSIS — Z9889 Other specified postprocedural states: Secondary | ICD-10-CM | POA: Diagnosis not present

## 2017-12-04 DIAGNOSIS — I351 Nonrheumatic aortic (valve) insufficiency: Secondary | ICD-10-CM | POA: Diagnosis not present

## 2017-12-04 DIAGNOSIS — I452 Bifascicular block: Secondary | ICD-10-CM

## 2017-12-04 DIAGNOSIS — Z8679 Personal history of other diseases of the circulatory system: Secondary | ICD-10-CM | POA: Diagnosis not present

## 2017-12-04 MED ORDER — ASPIRIN EC 81 MG PO TBEC: 81.0000 mg | DELAYED_RELEASE_TABLET | Freq: Every day | ORAL | 3 refills | Status: AC

## 2017-12-04 NOTE — Progress Notes (Signed)
OFFICE NOTE  Chief Complaint:  Follow-up aortic regurgitation  Primary Care Physician: Juluis Rainier, MD  HPI:  Kristi Erickson is a 77 y.o. female with a history of Aortic Regurgitation, HTN, HLD, Hypothyroidism, Turner's Syndrome, Horseshoe Kidney, GERD, Glaucoma. She presents today for follow up. She was seen about four years ago by Dr. Rennis Golden for aortic regurgitation. Echocardiogram 08/18/2011 revealed Normal LV function and parameters. Trileaflet Aortic Valve with Moderate Regurgitation and mild calcification. She did not have any anginal symptoms or shortness of breath at that time. She has continued to be treated for her other chronic medical problems in the interval since her visit then. She maintains good control of her blood pressure with Quinipril and Diltiazem, she is being treated for Hyperlipidemia with Simvastatin. She is also treated for hypothyroidism.   She has not had any development of new chest pain at rest or with exertion, she has not had any light headedness with exertion or presyncope, she does not have any shortness of breath at rest. She will get short of breath after walking 2 blocks with recovery in 1-2 minutes. She is able to climb 2 flights of stairs without difficulty. She has not had any lower extremity edema. She takes her medications daily and continues to follow with her PCP Dr. Zachery Dauer. Dr. Zachery Dauer referred her back here for follow up and echocardiography monitoring.   05/10/2015  Kristi Erickson returns today for follow-up. She was sent for an echocardiogram which demonstrated an LVEF of 60-65% with mild to moderate AI and a dilated aortic root measuring between 4.8 and 5.1 cm. Based on these findings I recommended a CT aortogram which she had on 04/29/2015. This demonstrated aneurysm of the ascending aorta, measuring up to 5.3 cm. I reviewed the results and had already referred her to thoracic surgery prior to this appointment. We again reviewed those results  in the office today.  10/10/2017  Kristi Erickson returns today for follow-up.  She was recently seen by Dr. Zachery Dauer to send her back for ongoing follow-up.  She is noted to have a persistent diastolic murmur.  As noted she had a history of aortic aneurysm and underwent aortic root replacement with resuspension of the native valve.  As evidenced by TEE after the procedure, she was noted to have mild to moderate AI that persisted.  Since the procedure she reports symptoms mostly of fatigue.  She is had persistently a low heart rate.  In December heart rate was in the upper 40s and today is 42.  EKG shows a marked sinus bradycardia with bifascicular block suggesting underlying intraventricular conduction delay.  She is on metoprolol.  She denies any chest pain or significant worsening shortness of breath.  08/03/2017  Kristi Erickson is seen today for follow-up of her echo.  This demonstrated normal systolic function and persistent moderate aortic regurgitation.  She continues to have fatigue however she reports is on and off.  I decreased her beta-blocker and heart rate is accordingly higher today in the upper 50s however she cannot note a difference in her symptoms.  PMHx:  Past Medical History:  Diagnosis Date  . Acute blood loss anemia   . Anxiety   . Ascending aortic aneurysm (HCC)   . Depression   . Diverticulitis   . GERD (gastroesophageal reflux disease)   . Glaucoma   . Heart murmur   . History of hiatal hernia   . Hyperlipidemia   . Hypertension   . Hypokalemia   .  Hypothyroidism   . IBS (irritable bowel syndrome)   . Insomnia   . Leukocytosis   . Osteoporosis   . Physical deconditioning   . Stomach problems   . Turner's syndrome   . Vision disorder   . Vitamin D deficiency     Past Surgical History:  Procedure Laterality Date  . ABDOMINAL SURGERY  1995   1970s  cyst from abdomen removed  . APPENDECTOMY    . CARDIAC CATHETERIZATION N/A 06/03/2015   Procedure: Left Heart Cath  and Coronary Angiography;  Surgeon: Corky Crafts, MD;  Location: [facility] INVASIVE CV LAB;  Service: Cardiovascular;  Laterality: N/A;  . CATARACT EXTRACTION W/ INTRAOCULAR LENS  IMPLANT, BILATERAL    . EYE SURGERY     left eye muscle     age 43  . GLAUCOMA VALVE INSERTION     right eye  . OTHER SURGICAL HISTORY  2010    ear surgery  . REPLACEMENT ASCENDING AORTA N/A 06/28/2015   Procedure: REPLACEMENT ASCENDING AORTA AND ARCH;  Surgeon: Loreli Slot, MD;  Location: [facility] OR;  Service: Open Heart Surgery;  Laterality: N/A;  . TEE WITHOUT CARDIOVERSION N/A 06/28/2015   Procedure: TRANSESOPHAGEAL ECHOCARDIOGRAM (TEE);  Surgeon: Loreli Slot, MD;  Location: [facility] OR;  Service: Open Heart Surgery;  Laterality: N/A;  . TONSILLECTOMY     t+a  . TRANSTHORACIC ECHOCARDIOGRAM  08/2011   EF=/> 55%, LA mildly dilated, midl mitral annular calcification, trace MR, trace TR, mild AV calcification w/mod regurg, trace pulm valve regurg    FAMHx:  Family History  Problem Relation Age of Onset  . Heart attack Father   . Heart attack Maternal Grandfather   . Skin cancer Brother     SOCHx:   reports that she has quit smoking. She quit after 3.00 years of use. She has never used smokeless tobacco. She reports that she drinks alcohol. She reports that she does not use drugs.  ALLERGIES:  Allergies  Allergen Reactions  . Remeron [Mirtazapine] Other (See Comments)    Other reaction(s): Unknown Did not work  . Shellfish-Derived Products Nausea And Vomiting    Clam, oyster    ROS: Pertinent items noted in HPI and remainder of comprehensive ROS otherwise negative.  HOME MEDS: Current Outpatient Medications  Medication Sig Dispense Refill  . alendronate (FOSAMAX) 70 MG tablet Take 70 mg by mouth every Tuesday.     . Ascorbic Acid (VITAMIN C) 1000 MG tablet Take 3,000 mg by mouth daily.     Marland Kitchen aspirin EC 325 MG EC tablet Take 1 tablet (325 mg total) by mouth daily. (Patient taking  differently: Take 81 mg by mouth daily. )    . calcium-vitamin D (OSCAL WITH D) 500-200 MG-UNIT tablet Take 1 tablet by mouth daily with breakfast.    . Cholecalciferol (VITAMIN D) 2000 units CAPS Take 2,000 Units by mouth daily.     . dorzolamide (TRUSOPT) 2 % ophthalmic solution Place 1 drop into both eyes 2 (two) times daily.    Marland Kitchen latanoprost (XALATAN) 0.005 % ophthalmic solution Place 1 drop into the right eye daily.  3  . levothyroxine (SYNTHROID, LEVOTHROID) 88 MCG tablet Take 88 mcg by mouth every morning.     Marland Kitchen lisinopril (PRINIVIL,ZESTRIL) 20 MG tablet Take 1 tablet (20 mg total) by mouth daily.    . metoprolol tartrate (LOPRESSOR) 25 MG tablet Take 12.5 mg by mouth 2 (two) times daily.    . mirtazapine (REMERON) 45 MG tablet Take  45 mg by mouth at bedtime.    . Multiple Vitamin (MULTIVITAMIN) tablet Take 1 tablet by mouth daily.    . psyllium (METAMUCIL) 58.6 % powder Take 1 packet by mouth.    . simvastatin (ZOCOR) 40 MG tablet Take 40 mg by mouth every morning.     . temazepam (RESTORIL) 15 MG capsule Take 15 mg by mouth at bedtime.      No current facility-administered medications for this visit.     LABS/IMAGING: No results found for this or any previous visit (from the past 48 hour(s)). No results found.  WEIGHTS: Wt Readings from Last 3 Encounters:  12/04/17 116 lb (52.6 kg)  10/10/17 113 lb (51.3 kg)  12/25/16 113 lb 6.4 oz (51.4 kg)    VITALS: BP (!) 142/60   Pulse (!) 57   Ht 4\' 8"  (1.422 m)   Wt 116 lb (52.6 kg)   BMI 26.01 kg/m   EXAM: Deferred  EKG: Deferred  ASSESSMENT: 1. Possible symptomatic bradycardia 2. Bifascicular block 3. Aortic Regurgitation - mild to moderate 4. Ascending aortic aneurysm s/p root replacement and resuspension of the native valve (2017) 5. HTN 6. HLD 7. Hypothyroidism   PLAN: Ms. Mitchell HeirLatimer had possible symptomatic bradycardia however with her heart rate elevated now she does not feel significantly different.  She does  have mild to moderate aortic regurgitation.  This is been stable on echo.  We will repeat that study in a year.  Blood pressure is mildly elevated but not significantly different on reduced dose beta-blocker.  Follow-up with me annually or sooner as necessary.  Chrystie NoseKenneth C. Oney Tatlock, MD, Em[facility] Regional Medical CenterFACC, FACP  [place]  [facility]  Medical Director of the Advanced Lipid Disorders &  Cardiovascular Risk Reduction Clinic Diplomate of the American Board of Clinical Lipidology Attending Cardiologist  Direct Dial: [phone]  Fax: [phone]  Website:  [email or web address]  Ketara Cavness C Nansi [place] 12/04/2017, 4:18 PM

## 2017-12-04 NOTE — Patient Instructions (Signed)
Medication Instructions:  Continue current medications If you need a refill on your cardiac medications before your next appointment, please call your pharmacy.   Lab work: NONE  Testing/Procedures: Your physician has requested that you have an echocardiogram. Echocardiography is a painless test that uses sound waves to create images of your heart. It provides your doctor with information about the size and shape of your heart and how well your heart's chambers and valves are working. This procedure takes approximately one hour. There are no restrictions for this procedure. -- due in 1 year  Follow-Up: At [facility], you and your health needs are our priority.  As part of our continuing mission to provide you with exceptional heart care, we have created designated Provider Care Teams.  These Care Teams include your primary Cardiologist (physician) and Advanced Practice Providers (APPs -  Physician Assistants and Nurse Practitioners) who all work together to provide you with the care you need, when you need it. You will need a follow up appointment in 12 months.  Please call our office 2 months in advance to schedule this appointment.  You may see Dr. Rennis GoldenHilty or one of the following Advanced Practice Providers on your designated Care Team: Azalee CourseHao Meng, [place]PA-C . Micah FlesherAngela Duke, PA-C  Any Other Special Instructions Will Be Listed Below (If Applicable).

## 2017-12-05 ENCOUNTER — Telehealth: Payer: Self-pay | Admitting: Internal Medicine

## 2017-12-05 MED ORDER — METOPROLOL TARTRATE 25 MG PO TABS: 12.5000 mg | ORAL_TABLET | Freq: Two times a day (BID) | ORAL | 3 refills | Status: DC

## 2017-12-05 NOTE — Telephone Encounter (Signed)
SCRIPT SENT AT PT'S REQUEST./CY

## 2017-12-05 NOTE — Telephone Encounter (Signed)
New Message    *STAT* If patient is at the pharmacy, call can be transferred to refill team.   1. Which medications need to be refilled? (please list name of each medication and dose if known)  metoprolol tartrate (LOPRESSOR) 25 MG tablet  2. Which pharmacy/location (including street and city if local pharmacy) is medication to be sent to? [id number] - [place], [place] - [address] [facility] [address]  3. Do they need a 30 day or 90 day supply? 90

## 2018-05-22 ENCOUNTER — Telehealth: Payer: Self-pay | Admitting: Internal Medicine

## 2018-05-22 MED ORDER — METOPROLOL SUCCINATE ER 25 MG PO TB24: 25.0000 mg | ORAL_TABLET | Freq: Every day | ORAL | 3 refills | Status: DC

## 2018-05-22 NOTE — Telephone Encounter (Signed)
Made changes to patient's metoprolol to Toprol XL 25 mg daily. Sent to patient's pharmacy of choice. Patient is aware of change and verbalized understanding.

## 2018-05-22 NOTE — Telephone Encounter (Signed)
Pt called to discuss recent changes that were made to her medication. She would like to speak with Dr. Blanchie Dessert nurse as soon as possible

## 2018-05-22 NOTE — Telephone Encounter (Signed)
Patient is currently taking metoprolol tartrate 12.5 mg BID. Patient would like to take metoprolol succinate 25 mg daily instead. Patient does not want to have to take a pill twice a day. Will forward to Dr. Rennis Golden for order if he agrees with change.

## 2018-05-22 NOTE — Telephone Encounter (Signed)
Ok to make that change to Toprol XL 25 mg daily.  Dr. Rexene Edison

## 2019-05-02 ENCOUNTER — Other Ambulatory Visit: Payer: Self-pay

## 2019-05-02 ENCOUNTER — Ambulatory Visit: Payer: Medicare Other | Admitting: Internal Medicine

## 2019-05-02 ENCOUNTER — Encounter: Payer: Self-pay | Admitting: Internal Medicine

## 2019-05-02 VITALS — BP 130/70 | HR 56 | Temp 96.8°F | Ht 59.0 in | Wt 113.8 lb

## 2019-05-02 DIAGNOSIS — I351 Nonrheumatic aortic (valve) insufficiency: Secondary | ICD-10-CM

## 2019-05-02 DIAGNOSIS — I452 Bifascicular block: Secondary | ICD-10-CM

## 2019-05-02 DIAGNOSIS — R001 Bradycardia, unspecified: Secondary | ICD-10-CM | POA: Diagnosis not present

## 2019-05-02 MED ORDER — METOPROLOL SUCCINATE ER 25 MG PO TB24: 12.5000 mg | ORAL_TABLET | Freq: Every day | ORAL | 3 refills | Status: DC

## 2019-05-02 NOTE — Patient Instructions (Signed)
Medication Instructions:  Your physician has recommended you make the following change in your medication:  -- DECREASE metoprolol succinate to 12.5mg  daily (half of a 25mg  tablet)  *If you need a refill on your cardiac medications before your next appointment, please call your pharmacy*   Testing/Procedures: ECHOCARDIOGRAM @ [address]   Follow-Up: At , you and your health needs are our priority.  As part of our continuing mission to provide you with exceptional heart care, we have created designated Provider Care Teams.  These Care Teams include your primary Cardiologist (physician) and Advanced Practice Providers (APPs -  Physician Assistants and Nurse Practitioners) who all work together to provide you with the care you need, when you need it.  We recommend signing up for the patient portal called "MyChart".  Sign up information is provided on this After Visit Summary.  MyChart is used to connect with patients for Virtual Visits (Telemedicine).  Patients are able to view lab/test results, encounter notes, upcoming appointments, etc.  Non-urgent messages can be sent to your provider as well.   To learn more about what you can do with MyChart, go to [employer].    Your next appointment:   12 month(s)  The format for your next appointment:   In Person  Provider:   You may see Dr. [email or web address] or one of the following Advanced Practice Providers on your designated Care Team:    Zoila Shutter, PA-C  Azalee Course, Micah Flesher or   [place], Judy Pimple    Other Instructions

## 2019-05-02 NOTE — Progress Notes (Signed)
OFFICE NOTE  Chief Complaint:  Follow-up aortic regurgitation  Primary Care Physician: Juluis Rainier, MD  HPI:  Kristi Erickson is a 79 y.o. female with a history of Aortic Regurgitation, HTN, HLD, Hypothyroidism, Turner's Syndrome, Horseshoe Kidney, GERD, Glaucoma. She presents today for follow up. She was seen about four years ago by Dr. Rennis Golden for aortic regurgitation. Echocardiogram 08/18/2011 revealed Normal LV function and parameters. Trileaflet Aortic Valve with Moderate Regurgitation and mild calcification. She did not have any anginal symptoms or shortness of breath at that time. She has continued to be treated for her other chronic medical problems in the interval since her visit then. She maintains good control of her blood pressure with Quinipril and Diltiazem, she is being treated for Hyperlipidemia with Simvastatin. She is also treated for hypothyroidism.   She has not had any development of new chest pain at rest or with exertion, she has not had any light headedness with exertion or presyncope, she does not have any shortness of breath at rest. She will get short of breath after walking 2 blocks with recovery in 1-2 minutes. She is able to climb 2 flights of stairs without difficulty. She has not had any lower extremity edema. She takes her medications daily and continues to follow with her PCP Dr. Zachery Dauer. Dr. Zachery Dauer referred her back here for follow up and echocardiography monitoring.   05/10/2015  [name] returns today for follow-up. She was sent for an echocardiogram which demonstrated an LVEF of 60-65% with mild to moderate AI and a dilated aortic root measuring between 4.8 and 5.1 cm. Based on these findings I recommended a CT aortogram which she had on 04/29/2015. This demonstrated aneurysm of the ascending aorta, measuring up to 5.3 cm. I reviewed the results and had already referred her to thoracic surgery prior to this appointment. We again reviewed those results  in the office today.  10/10/2017  Mrs. Raffel returns today for follow-up.  She was recently seen by Dr. Zachery Dauer to send her back for ongoing follow-up.  She is noted to have a persistent diastolic murmur.  As noted she had a history of aortic aneurysm and underwent aortic root replacement with resuspension of the native valve.  As evidenced by TEE after the procedure, she was noted to have mild to moderate AI that persisted.  Since the procedure she reports symptoms mostly of fatigue.  She is had persistently a low heart rate.  In December heart rate was in the upper 40s and today is 42.  EKG shows a marked sinus bradycardia with bifascicular block suggesting underlying intraventricular conduction delay.  She is on metoprolol.  She denies any chest pain or significant worsening shortness of breath.  08/03/2017  Mrs. Lindo is seen today for follow-up of her echo.  This demonstrated normal systolic function and persistent moderate aortic regurgitation.  She continues to have fatigue however she reports is on and off.  I decreased her beta-blocker and heart rate is accordingly higher today in the upper 50s however she cannot note a difference in her symptoms.  05/02/2019  Mrs. Genao returns for follow-up.  Overall she has complaints about fatigue.  Is not clear if this is multifactorial related to heart rate.  In the past her heart rates been in the 40s and I decreased her beta-blocker.  Today her heart rates in the 50s however it looks like a junctional rhythm.  She does have a bifascicular block.  In addition she has aortic insufficiency  which was at least moderate in 2019.  If that is now more severe could cause her to be short of breath as well.  PMHx:  Past Medical History:  Diagnosis Date  . Acute blood loss anemia   . Anxiety   . Ascending aortic aneurysm ([place])   . Depression   . Diverticulitis   . GERD (gastroesophageal reflux disease)   . Glaucoma   . Heart murmur   . History of  hiatal hernia   . Hyperlipidemia   . Hypertension   . Hypokalemia   . Hypothyroidism   . IBS (irritable bowel syndrome)   . Insomnia   . Leukocytosis   . Osteoporosis   . Physical deconditioning   . Stomach problems   . Turner's syndrome   . Vision disorder   . Vitamin D deficiency     Past Surgical History:  Procedure Laterality Date  . ABDOMINAL SURGERY  1995   1970s  cyst from abdomen removed  . APPENDECTOMY    . CARDIAC CATHETERIZATION N/A 06/03/2015   Procedure: Left Heart Cath and Coronary Angiography;  Surgeon: Jettie Booze, MD;  Location: [place] CV LAB;  Service: Cardiovascular;  Laterality: N/A;  . CATARACT EXTRACTION W/ INTRAOCULAR LENS  IMPLANT, BILATERAL    . EYE SURGERY     left eye muscle     age 24  . GLAUCOMA VALVE INSERTION     right eye  . OTHER SURGICAL HISTORY  2010    ear surgery  . REPLACEMENT ASCENDING AORTA N/A 06/28/2015   Procedure: REPLACEMENT ASCENDING AORTA AND ARCH;  Surgeon: Melrose Nakayama, MD;  Location: [place];  Service: Open Heart Surgery;  Laterality: N/A;  . TEE WITHOUT CARDIOVERSION N/A 06/28/2015   Procedure: TRANSESOPHAGEAL ECHOCARDIOGRAM (TEE);  Surgeon: Melrose Nakayama, MD;  Location: [place];  Service: Open Heart Surgery;  Laterality: N/A;  . TONSILLECTOMY     t+a  . TRANSTHORACIC ECHOCARDIOGRAM  08/2011   EF=/> 55%, LA mildly dilated, midl mitral annular calcification, trace MR, trace TR, mild AV calcification w/mod regurg, trace pulm valve regurg    FAMHx:  Family History  Problem Relation Age of Onset  . Heart attack Father   . Heart attack Maternal Grandfather   . Skin cancer Brother     SOCHx:   reports that she has quit smoking. She quit after 3.00 years of use. She has never used smokeless tobacco. She reports current alcohol use. She reports that she does not use drugs.  ALLERGIES:  Allergies  Allergen Reactions  . Remeron [Mirtazapine] Other (See Comments)    Other reaction(s): Unknown Did not  work  . Shellfish-Derived Products Nausea And Vomiting    Clam, oyster    ROS: Pertinent items noted in HPI and remainder of comprehensive ROS otherwise negative.  HOME MEDS: Current Outpatient Medications  Medication Sig Dispense Refill  . alendronate (FOSAMAX) 70 MG tablet Take 70 mg by mouth every Tuesday.     . ALPHAGAN P 0.1 % SOLN Place 1 drop into the right eye 2 (two) times daily.    . Ascorbic Acid (VITAMIN C) 1000 MG tablet Take 3,000 mg by mouth daily.     Marland Kitchen aspirin EC 81 MG tablet Take 1 tablet (81 mg total) by mouth daily. 90 tablet 3  . augmented betamethasone dipropionate (DIPROLENE-AF) 0.05 % cream     . calcium-vitamin D (OSCAL WITH D) 500-200 MG-UNIT tablet Take 1 tablet by mouth daily with breakfast.    .  Cholecalciferol (VITAMIN D) 2000 units CAPS Take 2,000 Units by mouth daily.     . dorzolamide (TRUSOPT) 2 % ophthalmic solution Place 1 drop into both eyes 2 (two) times daily.    . dorzolamide-timolol (COSOPT) 22.3-6.8 MG/ML ophthalmic solution 1 drop 2 (two) times daily.    Marland Kitchen latanoprost (XALATAN) 0.005 % ophthalmic solution Place 1 drop into the right eye daily.  3  . levothyroxine (SYNTHROID, LEVOTHROID) 88 MCG tablet Take 88 mcg by mouth every morning.     Marland Kitchen lisinopril (PRINIVIL,ZESTRIL) 20 MG tablet Take 1 tablet (20 mg total) by mouth daily.    . metoprolol succinate (TOPROL XL) 25 MG 24 hr tablet Take 1 tablet (25 mg total) by mouth daily. 90 tablet 3  . mirtazapine (REMERON) 45 MG tablet Take 45 mg by mouth at bedtime.    . Multiple Vitamin (MULTIVITAMIN) tablet Take 1 tablet by mouth daily.    . psyllium (METAMUCIL) 58.6 % powder Take 1 packet by mouth.    Marland Kitchen ROCKLATAN 0.02-0.005 % SOLN SMARTSIG:1 Drop(s) Left Eye Every Evening    . simvastatin (ZOCOR) 40 MG tablet Take 40 mg by mouth every morning.     . temazepam (RESTORIL) 15 MG capsule Take 15 mg by mouth at bedtime.      No current facility-administered medications for this visit.     LABS/IMAGING: No results found for this or any previous visit (from the past 48 hour(s)). No results found.  WEIGHTS: Wt Readings from Last 3 Encounters:  05/02/19 113 lb 12.8 oz (51.6 kg)  12/04/17 116 lb (52.6 kg)  10/10/17 113 lb (51.3 kg)    VITALS: BP 130/70   Pulse (!) 56   Temp (!) 96.8 F (36 C)   Ht 4\' 11"  (1.499 m)   Wt 113 lb 12.8 oz (51.6 kg)   SpO2 96%   BMI 22.98 kg/m   EXAM: General appearance: alert and no distress Neck: no carotid bruit, no JVD and thyroid not enlarged, symmetric, no tenderness/mass/nodules Lungs: clear to auscultation bilaterally Heart: regular rate and rhythm, S1, S2 normal and diastolic murmur: holodiastolic 3/6, blowing at apex Abdomen: soft, non-tender; bowel sounds normal; no masses,  no organomegaly Extremities: extremities normal, atraumatic, no cyanosis or edema Pulses: 2+ and symmetric Skin: Skin color, texture, turgor normal. No rashes or lesions Neurologic: Grossly normal Psych: Pleasant  EKG: Junctional rhythm at 56, bifascicular block, inferior infarct pattern-personally reviewed  ASSESSMENT: 1. Possible symptomatic bradycardia 2. Bifascicular block 3. Aortic Regurgitation - moderate 4. Ascending aortic aneurysm s/p root replacement and resuspension of the native valve (2017) 5. HTN 6. HLD 7. Hypothyroidism   PLAN: Ms. Cleaves reports some fatigue and has possible symptomatic bradycardia.  Looks like a junctional rhythm with intraventricular conduction delay/right bundle branch block.  I advised decreasing her metoprolol further 12.5mg  daily.  In addition we will repeat an echo to see if she has had some progressive aortic insufficiency.  If it severe, we may need to consider treatment for that.  Plan follow-up with me annually or sooner if the echo results dictates that.  Mitchell Heir, MD, [facility], FACP  [place]  [facility]  Medical Director of the Advanced Lipid Disorders &  Cardiovascular Risk  Reduction Clinic Diplomate of the American Board of Clinical Lipidology Attending Cardiologist  Direct Dial: [phone]  Fax: [phone]  Website:  [email or web address] 05/02/2019, 2:59 PM

## 2019-05-27 ENCOUNTER — Other Ambulatory Visit (HOSPITAL_COMMUNITY): Payer: Medicare Other

## 2019-05-27 ENCOUNTER — Encounter (HOSPITAL_COMMUNITY): Payer: Self-pay | Admitting: *Deleted

## 2019-05-27 NOTE — Progress Notes (Signed)
Patient ID: Kristi Erickson, female   DOB: [birth year], 79 y.o.   MRN: [id number]   Spoke with Deidre Ala, CMA via secure chat at 2:15 pm.  Patient was not checked in, and did not respond to being called in the lobby.  Echo will be marked a No Show.   Farrel Conners, RDCS

## 2019-05-28 ENCOUNTER — Other Ambulatory Visit (HOSPITAL_COMMUNITY): Payer: Medicare Other

## 2019-06-02 ENCOUNTER — Other Ambulatory Visit: Payer: Self-pay

## 2019-06-02 ENCOUNTER — Ambulatory Visit (HOSPITAL_COMMUNITY): Payer: Medicare Other | Attending: Cardiology

## 2019-06-02 DIAGNOSIS — I351 Nonrheumatic aortic (valve) insufficiency: Secondary | ICD-10-CM | POA: Diagnosis not present

## 2019-09-26 ENCOUNTER — Other Ambulatory Visit (HOSPITAL_COMMUNITY): Payer: Self-pay | Admitting: Family Medicine

## 2019-09-26 DIAGNOSIS — R0989 Other specified symptoms and signs involving the circulatory and respiratory systems: Secondary | ICD-10-CM

## 2019-10-22 ENCOUNTER — Other Ambulatory Visit: Payer: Self-pay

## 2019-10-22 ENCOUNTER — Ambulatory Visit (HOSPITAL_COMMUNITY)
Admission: RE | Admit: 2019-10-22 | Discharge: 2019-10-22 | Disposition: A | Discharge status: Home / Self Care | Payer: Medicare Other | Source: Ambulatory Visit | Attending: Family Medicine | Admitting: Family Medicine

## 2019-10-22 DIAGNOSIS — R0989 Other specified symptoms and signs involving the circulatory and respiratory systems: Secondary | ICD-10-CM

## 2019-10-22 NOTE — Progress Notes (Signed)
Carotid artery duplex completed. Refer to "CV Proc" under chart review to view preliminary results.  10/22/2019 2:25 PM Eula Fried., MHA, RVT, RDCS, RDMS

## 2020-03-25 ENCOUNTER — Encounter: Payer: Self-pay | Admitting: Internal Medicine

## 2020-03-25 ENCOUNTER — Other Ambulatory Visit: Payer: Self-pay

## 2020-03-25 ENCOUNTER — Ambulatory Visit: Payer: Medicare Other | Admitting: Internal Medicine

## 2020-03-25 VITALS — BP 129/65 | HR 60 | Ht 59.0 in | Wt 106.4 lb

## 2020-03-25 DIAGNOSIS — I351 Nonrheumatic aortic (valve) insufficiency: Secondary | ICD-10-CM

## 2020-03-25 DIAGNOSIS — I452 Bifascicular block: Secondary | ICD-10-CM

## 2020-03-25 NOTE — Progress Notes (Signed)
OFFICE NOTE  Chief Complaint:  Follow-up aortic regurgitation  Primary Care Physician: Clayborn Heron, MD  HPI:  Kristi Erickson is a 80 y.o. female with a history of Aortic Regurgitation, HTN, HLD, Hypothyroidism, Turner's Syndrome, Horseshoe Kidney, GERD, Glaucoma. She presents today for follow up. She was seen about four years ago by Dr. Rennis Golden for aortic regurgitation. Echocardiogram 08/18/2011 revealed Normal LV function and parameters. Trileaflet Aortic Valve with Moderate Regurgitation and mild calcification. She did not have any anginal symptoms or shortness of breath at that time. She has continued to be treated for her other chronic medical problems in the interval since her visit then. She maintains good control of her blood pressure with Quinipril and Diltiazem, she is being treated for Hyperlipidemia with Simvastatin. She is also treated for hypothyroidism.   She has not had any development of new chest pain at rest or with exertion, she has not had any light headedness with exertion or presyncope, she does not have any shortness of breath at rest. She will get short of breath after walking 2 blocks with recovery in 1-2 minutes. She is able to climb 2 flights of stairs without difficulty. She has not had any lower extremity edema. She takes her medications daily and continues to follow with her PCP Dr. Zachery Dauer. Dr. Zachery Dauer referred her back here for follow up and echocardiography monitoring.   05/10/2015  Kristi Erickson returns today for follow-up. She was sent for an echocardiogram which demonstrated an LVEF of 60-65% with mild to moderate AI and a dilated aortic root measuring between 4.8 and 5.1 cm. Based on these findings I recommended a CT aortogram which she had on 04/29/2015. This demonstrated aneurysm of the ascending aorta, measuring up to 5.3 cm. I reviewed the results and had already referred her to thoracic surgery prior to this appointment. We again reviewed those  results in the office today.  10/10/2017  Kristi Erickson returns today for follow-up.  She was recently seen by Dr. Zachery Dauer to send her back for ongoing follow-up.  She is noted to have a persistent diastolic murmur.  As noted she had a history of aortic aneurysm and underwent aortic root replacement with resuspension of the native valve.  As evidenced by TEE after the procedure, she was noted to have mild to moderate AI that persisted.  Since the procedure she reports symptoms mostly of fatigue.  She is had persistently a low heart rate.  In December heart rate was in the upper 40s and today is 42.  EKG shows a marked sinus bradycardia with bifascicular block suggesting underlying intraventricular conduction delay.  She is on metoprolol.  She denies any chest pain or significant worsening shortness of breath.  08/03/2017  Kristi Erickson is seen today for follow-up of her echo.  This demonstrated normal systolic function and persistent moderate aortic regurgitation.  She continues to have fatigue however she reports is on and off.  I decreased her beta-blocker and heart rate is accordingly higher today in the upper 50s however she cannot note a difference in her symptoms.  05/02/2019  Kristi Erickson returns for follow-up.  Overall she has complaints about fatigue.  Is not clear if this is multifactorial related to heart rate.  In the past her heart rates been in the 40s and I decreased her beta-blocker.  Today her heart rates in the 50s however it looks like a junctional rhythm.  She does have a bifascicular block.  In addition she has aortic  insufficiency which was at least moderate in 2019.  If that is now more severe could cause her to be short of breath as well.  03/25/2020  Kristi Erickson seen today in follow-up of her aortic insufficiency.  She was recently seen by Dr. Barbaraann Barthelankins and previously was followed by Dr. Zachery DauerBarnes prior to her retiring.  Overall she says she feels well.  She is very hard of hearing.   She denies any worsening shortness of breath.  She had an echo last year which showed moderate aortic insufficiency what was thought to be stable.  She has an EKG that shows sinus rhythm and right bundle branch block.  She had labs recently showing total cholesterol 133, HDL 37, LDL 70 and triglycerides 153.  PMHx:  Past Medical History:  Diagnosis Date  . Acute blood loss anemia   . Anxiety   . Ascending aortic aneurysm (HCC)   . Depression   . Diverticulitis   . GERD (gastroesophageal reflux disease)   . Glaucoma   . Heart murmur   . History of hiatal hernia   . Hyperlipidemia   . Hypertension   . Hypokalemia   . Hypothyroidism   . IBS (irritable bowel syndrome)   . Insomnia   . Leukocytosis   . Osteoporosis   . Physical deconditioning   . Stomach problems   . Turner's syndrome   . Vision disorder   . Vitamin D deficiency     Past Surgical History:  Procedure Laterality Date  . ABDOMINAL SURGERY  1995   1970s  cyst from abdomen removed  . APPENDECTOMY    . CARDIAC CATHETERIZATION N/A 06/03/2015   Procedure: Left Heart Cath and Coronary Angiography;  Surgeon: Corky CraftsJayadeep S Varanasi, MD;  [facility];  Service: Cardiovascular;  Laterality: N/A;  . CATARACT EXTRACTION W/ INTRAOCULAR LENS  IMPLANT, BILATERAL    . EYE SURGERY     left eye muscle     age 89  . GLAUCOMA VALVE INSERTION     right eye  . OTHER SURGICAL HISTORY  2010    ear surgery  . REPLACEMENT ASCENDING AORTA N/A 06/28/2015   Procedure: REPLACEMENT ASCENDING AORTA AND ARCH;  Surgeon: Loreli SlotSteven C Hendrickson, MD;  [facility];  Service: Open Heart Surgery;  Laterality: N/A;  . TEE WITHOUT CARDIOVERSION N/A 06/28/2015   Procedure: TRANSESOPHAGEAL ECHOCARDIOGRAM (TEE);  Surgeon: Loreli SlotSteven C Hendrickson, MD;  [facility];  Service: Open Heart Surgery;  Laterality: N/A;  . TONSILLECTOMY     t+a  . TRANSTHORACIC ECHOCARDIOGRAM  08/2011   EF=/> 55%, LA mildly dilated, midl mitral annular  calcification, trace MR, trace TR, mild AV calcification w/mod regurg, trace pulm valve regurg    FAMHx:  Family History  Problem Relation Age of Onset  . Heart attack Father   . Heart attack Maternal Grandfather   . Skin cancer Brother     SOCHx:   reports that she has quit smoking. She quit after 3.00 years of use. She has never used smokeless tobacco. She reports current alcohol use. She reports that she does not use drugs.  ALLERGIES:  Allergies  Allergen Reactions  . Remeron [Mirtazapine] Other (See Comments)    Other reaction(s): Unknown Did not work  . Shellfish-Derived Products Nausea And Vomiting    Clam, oyster    ROS: Pertinent items noted in HPI and remainder of comprehensive ROS otherwise negative.  HOME MEDS: Current Outpatient Medications  Medication Sig Dispense Refill  . alendronate (FOSAMAX)  70 MG tablet Take 70 mg by mouth every Tuesday.     . ALPHAGAN P 0.1 % SOLN Place 1 drop into the right eye 2 (two) times daily.    . Ascorbic Acid (VITAMIN C) 1000 MG tablet Take 3,000 mg by mouth daily.     Marland Kitchen aspirin EC 81 MG tablet Take 1 tablet (81 mg total) by mouth daily. 90 tablet 3  . bisacodyl (DULCOLAX) 5 MG EC tablet 4 tablets    . calcium-vitamin D (OSCAL WITH D) 500-200 MG-UNIT tablet Take 1 tablet by mouth daily with breakfast.    . ciprofloxacin-dexamethasone (CIPRODEX) OTIC suspension APPLY 4 DROPS TO LEFT EAR TWICE DAILY FOR 5 DAYS    . diazepam (VALIUM) 5 MG tablet 1/2 to 1 tablet    . dorzolamide (TRUSOPT) 2 % ophthalmic solution Place 1 drop into both eyes 2 (two) times daily.    Marland Kitchen latanoprost (XALATAN) 0.005 % ophthalmic solution Place 1 drop into the right eye daily.  3  . levothyroxine (SYNTHROID, LEVOTHROID) 88 MCG tablet Take 88 mcg by mouth every morning.     Marland Kitchen lisinopril (PRINIVIL,ZESTRIL) 20 MG tablet Take 1 tablet (20 mg total) by mouth daily.    . mirtazapine (REMERON) 45 MG tablet Take 45 mg by mouth at bedtime.    . Multiple Vitamin  (MULTIVITAMIN) tablet Take 1 tablet by mouth daily.    . psyllium (METAMUCIL) 58.6 % powder Take 1 packet by mouth.    Marland Kitchen ROCKLATAN 0.02-0.005 % SOLN SMARTSIG:1 Drop(s) Left Eye Every Evening    . simvastatin (ZOCOR) 40 MG tablet Take 40 mg by mouth every morning.     . temazepam (RESTORIL) 15 MG capsule Take 15 mg by mouth at bedtime.      No current facility-administered medications for this visit.    LABS/IMAGING: No results found for this or any previous visit (from the past 48 hour(s)). No results found.  WEIGHTS: Wt Readings from Last 3 Encounters:  03/25/20 106 lb 6.4 oz (48.3 kg)  05/02/19 113 lb 12.8 oz (51.6 kg)  12/04/17 116 lb (52.6 kg)    VITALS: BP 129/65   Pulse 60   Ht 4\' 11"  (1.499 m)   Wt 106 lb 6.4 oz (48.3 kg)   SpO2 98%   BMI 21.49 kg/m   EXAM: General appearance: alert and no distress Neck: no carotid bruit, no JVD and thyroid not enlarged, symmetric, no tenderness/mass/nodules Lungs: clear to auscultation bilaterally Heart: regular rate and rhythm, S1, S2 normal and diastolic murmur: holodiastolic 3/6, blowing at apex Abdomen: soft, non-tender; bowel sounds normal; no masses,  no organomegaly Extremities: extremities normal, atraumatic, no cyanosis or edema Pulses: 2+ and symmetric Skin: Skin color, texture, turgor normal. No rashes or lesions Neurologic: Grossly normal Psych: Pleasant  EKG: Normal sinus rhythm at 60, RBBB-personally reviewed  ASSESSMENT: 1. Possible symptomatic bradycardia 2. Bifascicular block 3. Aortic Regurgitation - moderate 4. Ascending aortic aneurysm s/p root replacement and resuspension of the native valve (2017) 5. HTN 6. HLD 7. Hypothyroidism   PLAN: Ms. Laury reports some fatigue and has possible symptomatic bradycardia.  Looks like a junctional rhythm with intraventricular conduction delay/right bundle branch block.  I advised decreasing her metoprolol further 12.5mg  daily.  In addition we will repeat an echo  to see if she has had some progressive aortic insufficiency.  If it severe, we may need to consider treatment for that.  Plan follow-up with me annually or sooner if the echo results dictates that.  Chrystie Nose, MD, [facility], FACP  [place]  [facility]  Medical Director of the Advanced Lipid Disorders &  Cardiovascular Risk Reduction Clinic Diplomate of the American Board of Clinical Lipidology Attending Cardiologist   Direct Dial: [phone]  Fax: [phone]  Website:  [email or web address] Nicely Dyann Goodspeed 03/25/2020, 1:37 PM

## 2020-03-25 NOTE — Patient Instructions (Signed)
Medication Instructions:  Your physician recommends that you continue on your current medications as directed. Please refer to the Current Medication list given to you today.  *If you need a refill on your cardiac medications before your next appointment, please call your pharmacy*   Testing/Procedures: Your physician has requested that you have an echocardiogram. Echocardiography is a painless test that uses sound waves to create images of your heart. It provides your doctor with information about the size and shape of your heart and how well your heart's chambers and valves are working. This procedure takes approximately one hour. There are no restrictions for this procedure. -- due March 2023 -- [address]   Follow-Up: At [facility], you and your health needs are our priority.  As part of our continuing mission to provide you with exceptional heart care, we have created designated Provider Care Teams.  These Care Teams include your primary Cardiologist (physician) and Advanced Practice Providers (APPs -  Physician Assistants and Nurse Practitioners) who all work together to provide you with the care you need, when you need it.  We recommend signing up for the patient portal called "MyChart".  Sign up information is provided on this After Visit Summary.  MyChart is used to connect with patients for Virtual Visits (Telemedicine).  Patients are able to view lab/test results, encounter notes, upcoming appointments, etc.  Non-urgent messages can be sent to your provider as well.   To learn more about what you can do with MyChart, go to [email or web address].    Your next appointment:   12 month(s)  The format for your next appointment:   In Person  Provider:   You may see Dr. Rennis Golden or one of the following Advanced Practice Providers on your designated Care Team:    Azalee Course, PA-C  Micah Flesher, [place] or   Judy Pimple, [place]    Other Instructions

## 2020-07-16 DIAGNOSIS — H401113 Primary open-angle glaucoma, right eye, severe stage: Secondary | ICD-10-CM | POA: Diagnosis not present

## 2020-07-19 DIAGNOSIS — G4700 Insomnia, unspecified: Secondary | ICD-10-CM | POA: Diagnosis not present

## 2020-07-26 ENCOUNTER — Other Ambulatory Visit: Payer: Self-pay | Admitting: Physician Assistant

## 2020-07-26 DIAGNOSIS — R1033 Periumbilical pain: Secondary | ICD-10-CM | POA: Diagnosis not present

## 2020-07-26 DIAGNOSIS — R197 Diarrhea, unspecified: Secondary | ICD-10-CM

## 2020-08-10 ENCOUNTER — Ambulatory Visit
Admission: RE | Admit: 2020-08-10 | Discharge: 2020-08-10 | Disposition: A | Discharge status: Home / Self Care | Payer: Medicare Other | Source: Ambulatory Visit | Attending: Physician Assistant | Admitting: Physician Assistant

## 2020-08-10 DIAGNOSIS — R197 Diarrhea, unspecified: Secondary | ICD-10-CM

## 2020-08-10 DIAGNOSIS — K8020 Calculus of gallbladder without cholecystitis without obstruction: Secondary | ICD-10-CM | POA: Diagnosis not present

## 2020-08-10 DIAGNOSIS — K5730 Diverticulosis of large intestine without perforation or abscess without bleeding: Secondary | ICD-10-CM | POA: Diagnosis not present

## 2020-08-10 DIAGNOSIS — R1033 Periumbilical pain: Secondary | ICD-10-CM

## 2020-08-10 DIAGNOSIS — K589 Irritable bowel syndrome without diarrhea: Secondary | ICD-10-CM | POA: Diagnosis not present

## 2020-08-10 MED ORDER — IOPAMIDOL (ISOVUE-370) INJECTION 76%
80.0000 mL | Freq: Once | INTRAVENOUS | Status: AC | PRN
  Administered 2020-08-10: 80 mL via INTRAVENOUS

## 2020-08-25 DIAGNOSIS — R350 Frequency of micturition: Secondary | ICD-10-CM | POA: Diagnosis not present

## 2020-09-06 DIAGNOSIS — H7011 Chronic mastoiditis, right ear: Secondary | ICD-10-CM | POA: Diagnosis not present

## 2020-09-29 DIAGNOSIS — E039 Hypothyroidism, unspecified: Secondary | ICD-10-CM | POA: Diagnosis not present

## 2020-09-29 DIAGNOSIS — E871 Hypo-osmolality and hyponatremia: Secondary | ICD-10-CM | POA: Diagnosis not present

## 2020-09-29 DIAGNOSIS — I10 Essential (primary) hypertension: Secondary | ICD-10-CM | POA: Diagnosis not present

## 2020-09-29 DIAGNOSIS — M810 Age-related osteoporosis without current pathological fracture: Secondary | ICD-10-CM | POA: Diagnosis not present

## 2020-09-29 DIAGNOSIS — G479 Sleep disorder, unspecified: Secondary | ICD-10-CM | POA: Diagnosis not present

## 2020-09-29 DIAGNOSIS — B353 Tinea pedis: Secondary | ICD-10-CM | POA: Diagnosis not present

## 2020-09-29 DIAGNOSIS — Z0000 Encounter for general adult medical examination without abnormal findings: Secondary | ICD-10-CM | POA: Diagnosis not present

## 2020-09-29 DIAGNOSIS — I351 Nonrheumatic aortic (valve) insufficiency: Secondary | ICD-10-CM | POA: Diagnosis not present

## 2020-09-29 DIAGNOSIS — E7800 Pure hypercholesterolemia, unspecified: Secondary | ICD-10-CM | POA: Diagnosis not present

## 2020-10-14 DIAGNOSIS — R350 Frequency of micturition: Secondary | ICD-10-CM | POA: Diagnosis not present

## 2020-11-02 DIAGNOSIS — Z1231 Encounter for screening mammogram for malignant neoplasm of breast: Secondary | ICD-10-CM | POA: Diagnosis not present

## 2020-11-16 DIAGNOSIS — H401113 Primary open-angle glaucoma, right eye, severe stage: Secondary | ICD-10-CM | POA: Diagnosis not present

## 2020-11-30 DIAGNOSIS — K5900 Constipation, unspecified: Secondary | ICD-10-CM | POA: Diagnosis not present

## 2020-11-30 DIAGNOSIS — R1030 Lower abdominal pain, unspecified: Secondary | ICD-10-CM | POA: Diagnosis not present

## 2020-12-09 DIAGNOSIS — L2084 Intrinsic (allergic) eczema: Secondary | ICD-10-CM | POA: Diagnosis not present

## 2021-01-21 DIAGNOSIS — H7011 Chronic mastoiditis, right ear: Secondary | ICD-10-CM | POA: Diagnosis not present

## 2021-01-26 DIAGNOSIS — R350 Frequency of micturition: Secondary | ICD-10-CM | POA: Diagnosis not present

## 2021-02-14 DIAGNOSIS — L853 Xerosis cutis: Secondary | ICD-10-CM | POA: Diagnosis not present

## 2021-02-14 DIAGNOSIS — L2084 Intrinsic (allergic) eczema: Secondary | ICD-10-CM | POA: Diagnosis not present

## 2021-03-18 ENCOUNTER — Other Ambulatory Visit: Payer: Self-pay | Admitting: Internal Medicine

## 2021-03-18 DIAGNOSIS — I351 Nonrheumatic aortic (valve) insufficiency: Secondary | ICD-10-CM

## 2021-03-22 DIAGNOSIS — H401113 Primary open-angle glaucoma, right eye, severe stage: Secondary | ICD-10-CM | POA: Diagnosis not present

## 2021-03-23 ENCOUNTER — Other Ambulatory Visit (HOSPITAL_COMMUNITY): Payer: Medicare Other

## 2021-03-23 DIAGNOSIS — K589 Irritable bowel syndrome without diarrhea: Secondary | ICD-10-CM | POA: Diagnosis not present

## 2021-03-23 DIAGNOSIS — K5900 Constipation, unspecified: Secondary | ICD-10-CM | POA: Diagnosis not present

## 2021-03-23 DIAGNOSIS — R1033 Periumbilical pain: Secondary | ICD-10-CM | POA: Diagnosis not present

## 2021-03-29 DIAGNOSIS — I351 Nonrheumatic aortic (valve) insufficiency: Secondary | ICD-10-CM | POA: Diagnosis not present

## 2021-03-29 DIAGNOSIS — E7800 Pure hypercholesterolemia, unspecified: Secondary | ICD-10-CM | POA: Diagnosis not present

## 2021-03-29 DIAGNOSIS — Z23 Encounter for immunization: Secondary | ICD-10-CM | POA: Diagnosis not present

## 2021-03-29 DIAGNOSIS — I10 Essential (primary) hypertension: Secondary | ICD-10-CM | POA: Diagnosis not present

## 2021-03-29 DIAGNOSIS — E039 Hypothyroidism, unspecified: Secondary | ICD-10-CM | POA: Diagnosis not present

## 2021-03-29 DIAGNOSIS — M810 Age-related osteoporosis without current pathological fracture: Secondary | ICD-10-CM | POA: Diagnosis not present

## 2021-04-26 ENCOUNTER — Ambulatory Visit (HOSPITAL_COMMUNITY): Payer: Medicare Other | Attending: Cardiology

## 2021-04-26 DIAGNOSIS — I351 Nonrheumatic aortic (valve) insufficiency: Secondary | ICD-10-CM | POA: Diagnosis not present

## 2021-04-26 LAB — ECHOCARDIOGRAM COMPLETE
Area-P 1/2: 3.53 cm2
P 1/2 time: 450 msec
S' Lateral: 2.5 cm

## 2021-05-10 ENCOUNTER — Ambulatory Visit: Payer: Medicare Other | Admitting: General Practice

## 2021-05-19 ENCOUNTER — Encounter: Payer: Self-pay | Admitting: Internal Medicine

## 2021-05-22 NOTE — Progress Notes (Signed)
?Cardiology Office Note:   ? ?Date:  05/23/2021  ? ?ID:  Kristi Erickson, DOB 1940-03-16, MRN [id number] ? ?PCP:  Clayborn Heron, MD ?  ?[facility] Providers ?Cardiologist:  Chrystie Nose, MD ?Cardiology APP:  Marcelino Duster, PA { ?Referring MD: Clayborn Heron, MD  ? ?Chief Complaint  ?Patient presents with  ? Follow-up  ?  Thoracic aortic aneurysm repair  ? ? ?History of Present Illness:   ? ?Kristi Erickson is a 81 y.o. female with a hx of aortic insufficiency, hypertension, hyperlipidemia, hypothyroidism, Turner syndrome, horseshoe kidney, GERD, glaucoma, and bifascicular block.  She had an echo in 2017 that showed a dilated aortic root of 4.8 and 5.1 cm.  CT aortogram was completed on 04/29/2015 and demonstrated aneurysm of the ascending aorta measuring up to 5.3 cm.  She was referred to thoracic surgery and underwent aortic root replacement with resuspension of the native valve.  TEE postprocedure showed mild to moderate AI that persisted and she has fatigue from this.  She has been bradycardic and Dr. Rennis Golden has reduced her beta-blocker. ? ?Left heart catheterization leading up to her surgery showed 90% stenosis in a small branch of the first diagonal and 25% LAD. ? ?Most recent echocardiogram 04/26/2021 showed a normal EF and stable AI. ? ?She presents today for follow up. She has no cardiac complaints. She has been seeing GI for stomach issues and urology for bladder issues. She does fatigue after walking around in the house for 20 min. No orthopnea or chest pain. No dizziness or near syncope. She questions if her BP is low today. She is asymptomatic, I will make no changes. ? ? ?Past Medical History:  ?Diagnosis Date  ? Acute blood loss anemia   ? Anxiety   ? Ascending aortic aneurysm (HCC)   ? Depression   ? Diverticulitis   ? GERD (gastroesophageal reflux disease)   ? Glaucoma   ? Heart murmur   ? History of hiatal hernia   ? Hyperlipidemia   ? Hypertension   ? Hypokalemia   ?  Hypothyroidism   ? IBS (irritable bowel syndrome)   ? Insomnia   ? Leukocytosis   ? Osteoporosis   ? Physical deconditioning   ? Stomach problems   ? Turner's syndrome   ? Vision disorder   ? Vitamin D deficiency   ? ? ?Past Surgical History:  ?Procedure Laterality Date  ? ABDOMINAL SURGERY  1995  ? 1970s  cyst from abdomen removed  ? APPENDECTOMY    ? CARDIAC CATHETERIZATION N/A 06/03/2015  ? Procedure: Left Heart Cath and Coronary Angiography;  Surgeon: Corky Crafts, MD;  [facility] LAB;  Service: Cardiovascular;  Laterality: N/A;  ? CATARACT EXTRACTION W/ INTRAOCULAR LENS  IMPLANT, BILATERAL    ? EYE SURGERY    ? left eye muscle     age 14  ? GLAUCOMA VALVE INSERTION    ? right eye  ? OTHER SURGICAL HISTORY  2010   ? ear surgery  ? REPLACEMENT ASCENDING AORTA N/A 06/28/2015  ? Procedure: REPLACEMENT ASCENDING AORTA AND ARCH;  Surgeon: Loreli Slot, MD;  Location: [facility];  Service: Open Heart Surgery;  Laterality: N/A;  ? TEE WITHOUT CARDIOVERSION N/A 06/28/2015  ? Procedure: TRANSESOPHAGEAL ECHOCARDIOGRAM (TEE);  Surgeon: Loreli Slot, MD;  Location: [facility] OR;  Service: Open Heart Surgery;  Laterality: N/A;  ? TONSILLECTOMY    ? t+a  ? TRANSTHORACIC ECHOCARDIOGRAM  08/2011  ?  EF=/> 55%, LA mildly dilated, midl mitral annular calcification, trace MR, trace TR, mild AV calcification w/mod regurg, trace pulm valve regurg  ? ? ?Current Medications: ?Current Meds  ?Medication Sig  ? acetaminophen (TYLENOL) 500 MG tablet Take 500 mg by mouth daily.  ? alendronate (FOSAMAX) 70 MG tablet Take 70 mg by mouth once a week.  ? ALPHAGAN P 0.1 % SOLN Place 1 drop into the right eye 2 (two) times daily.  ? Ascorbic Acid (VITAMIN C) 1000 MG tablet Take 3,000 mg by mouth daily.   ? aspirin EC 81 MG tablet Take 1 tablet (81 mg total) by mouth daily.  ? calcium-vitamin D (OSCAL WITH D) 500-200 MG-UNIT tablet Take 1 tablet by mouth daily with breakfast.  ? dorzolamide (TRUSOPT) 2 % ophthalmic solution  Place 1 drop into both eyes 2 (two) times daily.  ? levothyroxine (SYNTHROID, LEVOTHROID) 88 MCG tablet Take 88 mcg by mouth every morning.   ? lisinopril (PRINIVIL,ZESTRIL) 20 MG tablet Take 1 tablet (20 mg total) by mouth daily.  ? Multiple Vitamin (MULTIVITAMIN) tablet Take 1 tablet by mouth daily.  ? OVER THE COUNTER MEDICATION IB Guard twice daily  ? psyllium (METAMUCIL) 58.6 % powder Take 1 packet by mouth.  ? ROCKLATAN 0.02-0.005 % SOLN SMARTSIG:1 Drop(s) Left Eye Every Evening  ? simvastatin (ZOCOR) 40 MG tablet Take 40 mg by mouth every morning.   ? temazepam (RESTORIL) 15 MG capsule Take 15 mg by mouth at bedtime.   ?  ? ?Allergies:   Remeron [mirtazapine] and Shellfish-derived products  ? ?Social History  ? ?Socioeconomic History  ? Marital status: Single  ?  Spouse name: Not on file  ? Number of children: Not on file  ? Years of education: M.A.  ? Highest education level: Not on file  ?Occupational History  ? Not on file  ?Tobacco Use  ? Smoking status: Former  ?  Years: 3.00  ?  Types: Cigarettes  ? Smokeless tobacco: Never  ?Substance and Sexual Activity  ? Alcohol use: Yes  ?  Alcohol/week: 0.0 standard drinks  ?  Comment: 1-2 glasses of wine/week  ? Drug use: No  ? Sexual activity: Not on file  ?Other Topics Concern  ? Not on file  ?Social History Narrative  ? Not on file  ? ?Social Determinants of Health  ? ?Financial Resource Strain: Not on file  ?Food Insecurity: Not on file  ?Transportation Needs: Not on file  ?Physical Activity: Not on file  ?Stress: Not on file  ?Social Connections: Not on file  ?  ? ?Family History: ?The patient's family history includes Heart attack in her father and maternal grandfather; Skin cancer in her brother. ? ?ROS:   ?Please see the history of present illness.    ? All other systems reviewed and are negative. ? ?EKGs/Labs/Other Studies Reviewed:   ? ?The following studies were reviewed today: ? ?Echo 04/26/21: ?1. Left ventricular ejection fraction, by estimation, is  60 to 65%. The  ?left ventricle has normal function. The left ventricle has no regional  ?wall motion abnormalities. Left ventricular diastolic parameters are  ?consistent with Grade I diastolic  ?dysfunction (impaired relaxation).  ? 2. Right ventricular systolic function is normal. The right ventricular  ?size is normal.  ? 3. The mitral valve is normal in structure. No evidence of mitral valve  ?regurgitation. No evidence of mitral stenosis.  ? 4. The aortic valve is tricuspid. Aortic valve regurgitation is moderate.  ?Aortic valve sclerosis/calcification  is present, without any evidence of  ?aortic stenosis.  ? 5. The inferior vena cava is normal in size with greater than 50%  ?respiratory variability, suggesting right atrial pressure of 3 mmHg. ? ?EKG:  EKG is ordered today.  The ekg ordered today demonstrates sinus rhythm HRH 60, artifact, RBBB, LAFB ? ?Recent Labs: ?No results found for requested labs within last 8760 hours.  ?Recent Lipid Panel ?   ?Component Value Date/Time  ? CHOL 114 07/07/2015 0000  ? TRIG 135 07/07/2015 0000  ? HDL 24 (A) 07/07/2015 0000  ? LDLCALC 63 07/07/2015 0000  ? ? ? ?Risk Assessment/Calculations:   ?  ? ?    ? ?Physical Exam:   ? ?VS:  BP (!) 104/52   Pulse 60   Ht 4\' 11"  (1.499 m)   Wt 105 lb 9.6 oz (47.9 kg)   SpO2 95%   BMI 21.33 kg/m?    ? ?Wt Readings from Last 3 Encounters:  ?05/23/21 105 lb 9.6 oz (47.9 kg)  ?03/25/20 106 lb 6.4 oz (48.3 kg)  ?05/02/19 113 lb 12.8 oz (51.6 kg)  ?  ? ?GEN:  Well nourished, well developed in no acute distress ?HEENT: Normal ?NECK: No JVD; No carotid bruits ?LYMPHATICS: No lymphadenopathy ?CARDIAC: RRR, + murmur ?RESPIRATORY:  Clear to auscultation without rales, wheezing or rhonchi  ?ABDOMEN: Soft, non-tender, non-distended ?MUSCULOSKELETAL:  No edema; No deformity  ?SKIN: Warm and dry ?NEUROLOGIC:  Alert and oriented x 3 ?PSYCHIATRIC:  Normal affect  ? ?ASSESSMENT:   ? ?1. Aortic valve insufficiency, etiology of cardiac valve disease  unspecified   ?2. Ascending aortic aneurysm, unspecified whether ruptured [facility](HCC)   ?3. Essential hypertension   ?4. Dyslipidemia   ?5. Primary hypertension   ? ?PLAN:   ? ?In order of problems listed above: ? ?Sympto

## 2021-05-23 ENCOUNTER — Ambulatory Visit: Payer: Medicare Other | Admitting: Physician Assistant

## 2021-05-23 ENCOUNTER — Encounter: Payer: Self-pay | Admitting: Physician Assistant

## 2021-05-23 VITALS — BP 104/52 | HR 60 | Ht 59.0 in | Wt 105.6 lb

## 2021-05-23 DIAGNOSIS — E785 Hyperlipidemia, unspecified: Secondary | ICD-10-CM

## 2021-05-23 DIAGNOSIS — I351 Nonrheumatic aortic (valve) insufficiency: Secondary | ICD-10-CM | POA: Diagnosis not present

## 2021-05-23 DIAGNOSIS — I10 Essential (primary) hypertension: Secondary | ICD-10-CM

## 2021-05-23 DIAGNOSIS — I7121 Aneurysm of the ascending aorta, without rupture: Secondary | ICD-10-CM

## 2021-05-23 NOTE — Patient Instructions (Signed)
Medication Instructions:  ?Your physician recommends that you continue on your current medications as directed. Please refer to the Current Medication list given to you today.  ? ?*If you need a refill on your cardiac medications before your next appointment, please call your pharmacy* ? ? ?Lab Work: ?NONE ordered at this time of appointment  ? ?If you have labs (blood work) drawn today and your tests are completely normal, you will receive your results only by: ?MyChart Message (if you have MyChart) OR ?A paper copy in the mail ?If you have any lab test that is abnormal or we need to change your treatment, we will call you to review the results. ? ? ?Testing/Procedures: ?NONE ordered at this time of appointment  ? ? ? ?Follow-Up: ?At [facility], you and your health needs are our priority.  As part of our continuing mission to provide you with exceptional heart care, we have created designated Provider Care Teams.  These Care Teams include your primary Cardiologist (physician) and Advanced Practice Providers (APPs -  Physician Assistants and Nurse Practitioners) who all work together to provide you with the care you need, when you need it. ? ?We recommend signing up for the patient portal called "MyChart".  Sign up information is provided on this After Visit Summary.  MyChart is used to connect with patients for Virtual Visits (Telemedicine).  Patients are able to view lab/test results, encounter notes, upcoming appointments, etc.  Non-urgent messages can be sent to your provider as well.   ?To learn more about what you can do with MyChart, go to [email or web address].   ? ?Your next appointment:   ?11 month(s) ? ?The format for your next appointment:   ?In Person ? ?Provider:   ?Pixie Casino, MD   ? ? ?Other Instructions ? ? ?Important Information About Sugar ? ? ? ? ? ? ?

## 2021-05-27 DIAGNOSIS — K5900 Constipation, unspecified: Secondary | ICD-10-CM | POA: Diagnosis not present

## 2021-05-27 DIAGNOSIS — K589 Irritable bowel syndrome without diarrhea: Secondary | ICD-10-CM | POA: Diagnosis not present

## 2021-05-27 DIAGNOSIS — R1013 Epigastric pain: Secondary | ICD-10-CM | POA: Diagnosis not present

## 2021-06-10 DIAGNOSIS — H7011 Chronic mastoiditis, right ear: Secondary | ICD-10-CM | POA: Diagnosis not present

## 2021-07-05 DIAGNOSIS — H7011 Chronic mastoiditis, right ear: Secondary | ICD-10-CM | POA: Diagnosis not present

## 2021-07-20 DIAGNOSIS — R350 Frequency of micturition: Secondary | ICD-10-CM | POA: Diagnosis not present

## 2021-07-26 DIAGNOSIS — H401113 Primary open-angle glaucoma, right eye, severe stage: Secondary | ICD-10-CM | POA: Diagnosis not present

## 2021-09-13 DIAGNOSIS — H7011 Chronic mastoiditis, right ear: Secondary | ICD-10-CM | POA: Diagnosis not present

## 2021-10-11 DIAGNOSIS — M810 Age-related osteoporosis without current pathological fracture: Secondary | ICD-10-CM | POA: Diagnosis not present

## 2021-10-11 DIAGNOSIS — E7800 Pure hypercholesterolemia, unspecified: Secondary | ICD-10-CM | POA: Diagnosis not present

## 2021-10-11 DIAGNOSIS — G4700 Insomnia, unspecified: Secondary | ICD-10-CM | POA: Diagnosis not present

## 2021-10-11 DIAGNOSIS — E039 Hypothyroidism, unspecified: Secondary | ICD-10-CM | POA: Diagnosis not present

## 2021-10-11 DIAGNOSIS — Z0000 Encounter for general adult medical examination without abnormal findings: Secondary | ICD-10-CM | POA: Diagnosis not present

## 2021-10-11 DIAGNOSIS — H409 Unspecified glaucoma: Secondary | ICD-10-CM | POA: Diagnosis not present

## 2021-10-11 DIAGNOSIS — Z6821 Body mass index (BMI) 21.0-21.9, adult: Secondary | ICD-10-CM | POA: Diagnosis not present

## 2021-10-11 DIAGNOSIS — L819 Disorder of pigmentation, unspecified: Secondary | ICD-10-CM | POA: Diagnosis not present

## 2021-10-11 DIAGNOSIS — E559 Vitamin D deficiency, unspecified: Secondary | ICD-10-CM | POA: Diagnosis not present

## 2021-11-09 DIAGNOSIS — R1030 Lower abdominal pain, unspecified: Secondary | ICD-10-CM | POA: Diagnosis not present

## 2021-11-09 DIAGNOSIS — K5900 Constipation, unspecified: Secondary | ICD-10-CM | POA: Diagnosis not present

## 2021-11-21 DIAGNOSIS — R110 Nausea: Secondary | ICD-10-CM | POA: Diagnosis not present

## 2021-11-21 DIAGNOSIS — K5900 Constipation, unspecified: Secondary | ICD-10-CM | POA: Diagnosis not present

## 2021-11-28 ENCOUNTER — Emergency Department (HOSPITAL_COMMUNITY): Payer: Medicare Other

## 2021-11-28 ENCOUNTER — Inpatient Hospital Stay (HOSPITAL_COMMUNITY)
Admission: EM | Admit: 2021-11-28 | Discharge: 2022-01-09 | DRG: 853 | Disposition: E | Payer: Medicare Other | Attending: Critical Care Medicine | Admitting: Critical Care Medicine

## 2021-11-28 ENCOUNTER — Other Ambulatory Visit: Payer: Self-pay | Admitting: Gastroenterology

## 2021-11-28 ENCOUNTER — Ambulatory Visit
Admission: RE | Admit: 2021-11-28 | Discharge: 2021-11-28 | Disposition: A | Discharge status: Home / Self Care | Payer: Medicare Other | Source: Ambulatory Visit | Attending: Gastroenterology | Admitting: Gastroenterology

## 2021-11-28 DIAGNOSIS — E440 Moderate protein-calorie malnutrition: Secondary | ICD-10-CM | POA: Diagnosis present

## 2021-11-28 DIAGNOSIS — J9690 Respiratory failure, unspecified, unspecified whether with hypoxia or hypercapnia: Secondary | ICD-10-CM | POA: Diagnosis not present

## 2021-11-28 DIAGNOSIS — Z4682 Encounter for fitting and adjustment of non-vascular catheter: Secondary | ICD-10-CM | POA: Diagnosis not present

## 2021-11-28 DIAGNOSIS — J690 Pneumonitis due to inhalation of food and vomit: Secondary | ICD-10-CM | POA: Diagnosis not present

## 2021-11-28 DIAGNOSIS — J9811 Atelectasis: Secondary | ICD-10-CM | POA: Diagnosis not present

## 2021-11-28 DIAGNOSIS — K660 Peritoneal adhesions (postprocedural) (postinfection): Secondary | ICD-10-CM | POA: Diagnosis not present

## 2021-11-28 DIAGNOSIS — Q631 Lobulated, fused and horseshoe kidney: Secondary | ICD-10-CM

## 2021-11-28 DIAGNOSIS — R109 Unspecified abdominal pain: Secondary | ICD-10-CM

## 2021-11-28 DIAGNOSIS — Z87891 Personal history of nicotine dependence: Secondary | ICD-10-CM | POA: Diagnosis not present

## 2021-11-28 DIAGNOSIS — D62 Acute posthemorrhagic anemia: Secondary | ICD-10-CM | POA: Diagnosis not present

## 2021-11-28 DIAGNOSIS — Z9889 Other specified postprocedural states: Secondary | ICD-10-CM | POA: Diagnosis not present

## 2021-11-28 DIAGNOSIS — Z888 Allergy status to other drugs, medicaments and biological substances status: Secondary | ICD-10-CM

## 2021-11-28 DIAGNOSIS — E8770 Fluid overload, unspecified: Secondary | ICD-10-CM | POA: Diagnosis not present

## 2021-11-28 DIAGNOSIS — R140 Abdominal distension (gaseous): Secondary | ICD-10-CM | POA: Diagnosis not present

## 2021-11-28 DIAGNOSIS — Z8719 Personal history of other diseases of the digestive system: Secondary | ICD-10-CM | POA: Diagnosis not present

## 2021-11-28 DIAGNOSIS — I700 Atherosclerosis of aorta: Secondary | ICD-10-CM | POA: Diagnosis not present

## 2021-11-28 DIAGNOSIS — J9601 Acute respiratory failure with hypoxia: Secondary | ICD-10-CM | POA: Diagnosis not present

## 2021-11-28 DIAGNOSIS — Z79899 Other long term (current) drug therapy: Secondary | ICD-10-CM

## 2021-11-28 DIAGNOSIS — I10 Essential (primary) hypertension: Secondary | ICD-10-CM | POA: Diagnosis not present

## 2021-11-28 DIAGNOSIS — K5989 Other specified functional intestinal disorders: Secondary | ICD-10-CM | POA: Diagnosis not present

## 2021-11-28 DIAGNOSIS — K8020 Calculus of gallbladder without cholecystitis without obstruction: Secondary | ICD-10-CM | POA: Diagnosis not present

## 2021-11-28 DIAGNOSIS — K659 Peritonitis, unspecified: Secondary | ICD-10-CM | POA: Diagnosis not present

## 2021-11-28 DIAGNOSIS — E875 Hyperkalemia: Secondary | ICD-10-CM | POA: Diagnosis present

## 2021-11-28 DIAGNOSIS — R739 Hyperglycemia, unspecified: Secondary | ICD-10-CM | POA: Diagnosis not present

## 2021-11-28 DIAGNOSIS — R6521 Severe sepsis with septic shock: Secondary | ICD-10-CM | POA: Diagnosis present

## 2021-11-28 DIAGNOSIS — K219 Gastro-esophageal reflux disease without esophagitis: Secondary | ICD-10-CM | POA: Diagnosis present

## 2021-11-28 DIAGNOSIS — E870 Hyperosmolality and hypernatremia: Secondary | ICD-10-CM | POA: Diagnosis present

## 2021-11-28 DIAGNOSIS — I471 Supraventricular tachycardia, unspecified: Secondary | ICD-10-CM | POA: Diagnosis not present

## 2021-11-28 DIAGNOSIS — K631 Perforation of intestine (nontraumatic): Secondary | ICD-10-CM | POA: Diagnosis present

## 2021-11-28 DIAGNOSIS — E872 Acidosis, unspecified: Secondary | ICD-10-CM | POA: Diagnosis present

## 2021-11-28 DIAGNOSIS — F32A Depression, unspecified: Secondary | ICD-10-CM | POA: Diagnosis present

## 2021-11-28 DIAGNOSIS — K567 Ileus, unspecified: Secondary | ICD-10-CM | POA: Diagnosis not present

## 2021-11-28 DIAGNOSIS — Q969 Turner's syndrome, unspecified: Secondary | ICD-10-CM

## 2021-11-28 DIAGNOSIS — Z452 Encounter for adjustment and management of vascular access device: Secondary | ICD-10-CM | POA: Diagnosis not present

## 2021-11-28 DIAGNOSIS — Z515 Encounter for palliative care: Secondary | ICD-10-CM

## 2021-11-28 DIAGNOSIS — Z66 Do not resuscitate: Secondary | ICD-10-CM | POA: Diagnosis not present

## 2021-11-28 DIAGNOSIS — R609 Edema, unspecified: Secondary | ICD-10-CM | POA: Diagnosis not present

## 2021-11-28 DIAGNOSIS — E039 Hypothyroidism, unspecified: Secondary | ICD-10-CM | POA: Diagnosis present

## 2021-11-28 DIAGNOSIS — I080 Rheumatic disorders of both mitral and aortic valves: Secondary | ICD-10-CM | POA: Diagnosis present

## 2021-11-28 DIAGNOSIS — A419 Sepsis, unspecified organism: Principal | ICD-10-CM | POA: Diagnosis present

## 2021-11-28 DIAGNOSIS — R1084 Generalized abdominal pain: Secondary | ICD-10-CM | POA: Diagnosis not present

## 2021-11-28 DIAGNOSIS — Z91013 Allergy to seafood: Secondary | ICD-10-CM

## 2021-11-28 DIAGNOSIS — K6389 Other specified diseases of intestine: Secondary | ICD-10-CM | POA: Diagnosis not present

## 2021-11-28 DIAGNOSIS — H9190 Unspecified hearing loss, unspecified ear: Secondary | ICD-10-CM | POA: Diagnosis present

## 2021-11-28 DIAGNOSIS — E785 Hyperlipidemia, unspecified: Secondary | ICD-10-CM | POA: Diagnosis present

## 2021-11-28 DIAGNOSIS — G9341 Metabolic encephalopathy: Secondary | ICD-10-CM | POA: Diagnosis not present

## 2021-11-28 DIAGNOSIS — E871 Hypo-osmolality and hyponatremia: Secondary | ICD-10-CM | POA: Diagnosis not present

## 2021-11-28 DIAGNOSIS — N179 Acute kidney failure, unspecified: Secondary | ICD-10-CM | POA: Diagnosis present

## 2021-11-28 DIAGNOSIS — Z7983 Long term (current) use of bisphosphonates: Secondary | ICD-10-CM

## 2021-11-28 DIAGNOSIS — K5900 Constipation, unspecified: Secondary | ICD-10-CM | POA: Diagnosis not present

## 2021-11-28 DIAGNOSIS — R579 Shock, unspecified: Secondary | ICD-10-CM

## 2021-11-28 DIAGNOSIS — K5909 Other constipation: Secondary | ICD-10-CM | POA: Diagnosis present

## 2021-11-28 DIAGNOSIS — M47816 Spondylosis without myelopathy or radiculopathy, lumbar region: Secondary | ICD-10-CM | POA: Diagnosis not present

## 2021-11-28 DIAGNOSIS — E876 Hypokalemia: Secondary | ICD-10-CM | POA: Diagnosis not present

## 2021-11-28 DIAGNOSIS — Z5331 Laparoscopic surgical procedure converted to open procedure: Secondary | ICD-10-CM

## 2021-11-28 DIAGNOSIS — M810 Age-related osteoporosis without current pathological fracture: Secondary | ICD-10-CM | POA: Diagnosis present

## 2021-11-28 DIAGNOSIS — K5730 Diverticulosis of large intestine without perforation or abscess without bleeding: Secondary | ICD-10-CM | POA: Diagnosis not present

## 2021-11-28 DIAGNOSIS — K529 Noninfective gastroenteritis and colitis, unspecified: Secondary | ICD-10-CM | POA: Diagnosis not present

## 2021-11-28 DIAGNOSIS — N170 Acute kidney failure with tubular necrosis: Secondary | ICD-10-CM | POA: Diagnosis not present

## 2021-11-28 DIAGNOSIS — J95821 Acute postprocedural respiratory failure: Secondary | ICD-10-CM | POA: Diagnosis not present

## 2021-11-28 DIAGNOSIS — J9600 Acute respiratory failure, unspecified whether with hypoxia or hypercapnia: Secondary | ICD-10-CM

## 2021-11-28 DIAGNOSIS — N736 Female pelvic peritoneal adhesions (postinfective): Secondary | ICD-10-CM | POA: Diagnosis present

## 2021-11-28 DIAGNOSIS — R64 Cachexia: Secondary | ICD-10-CM | POA: Diagnosis not present

## 2021-11-28 DIAGNOSIS — Z961 Presence of intraocular lens: Secondary | ICD-10-CM | POA: Diagnosis present

## 2021-11-28 DIAGNOSIS — Z79890 Hormone replacement therapy (postmenopausal): Secondary | ICD-10-CM

## 2021-11-28 DIAGNOSIS — F418 Other specified anxiety disorders: Secondary | ICD-10-CM | POA: Diagnosis not present

## 2021-11-28 DIAGNOSIS — K56609 Unspecified intestinal obstruction, unspecified as to partial versus complete obstruction: Secondary | ICD-10-CM | POA: Diagnosis not present

## 2021-11-28 DIAGNOSIS — K589 Irritable bowel syndrome without diarrhea: Secondary | ICD-10-CM | POA: Diagnosis present

## 2021-11-28 DIAGNOSIS — Z7982 Long term (current) use of aspirin: Secondary | ICD-10-CM

## 2021-11-28 DIAGNOSIS — R54 Age-related physical debility: Secondary | ICD-10-CM | POA: Diagnosis present

## 2021-11-28 DIAGNOSIS — Z6820 Body mass index (BMI) 20.0-20.9, adult: Secondary | ICD-10-CM

## 2021-11-28 DIAGNOSIS — G4700 Insomnia, unspecified: Secondary | ICD-10-CM | POA: Diagnosis present

## 2021-11-28 DIAGNOSIS — J90 Pleural effusion, not elsewhere classified: Secondary | ICD-10-CM | POA: Diagnosis not present

## 2021-11-28 DIAGNOSIS — Z8249 Family history of ischemic heart disease and other diseases of the circulatory system: Secondary | ICD-10-CM

## 2021-11-28 DIAGNOSIS — F419 Anxiety disorder, unspecified: Secondary | ICD-10-CM | POA: Diagnosis present

## 2021-11-28 DIAGNOSIS — K5651 Intestinal adhesions [bands], with partial obstruction: Secondary | ICD-10-CM | POA: Diagnosis not present

## 2021-11-28 DIAGNOSIS — R339 Retention of urine, unspecified: Secondary | ICD-10-CM | POA: Diagnosis not present

## 2021-11-28 DIAGNOSIS — K668 Other specified disorders of peritoneum: Secondary | ICD-10-CM | POA: Diagnosis not present

## 2021-11-28 DIAGNOSIS — E860 Dehydration: Secondary | ICD-10-CM | POA: Diagnosis present

## 2021-11-28 DIAGNOSIS — Z9911 Dependence on respirator [ventilator] status: Secondary | ICD-10-CM | POA: Diagnosis not present

## 2021-11-28 DIAGNOSIS — E041 Nontoxic single thyroid nodule: Secondary | ICD-10-CM | POA: Diagnosis present

## 2021-11-28 LAB — COMPREHENSIVE METABOLIC PANEL
ALT: 22 U/L (ref 0–44)
AST: 21 U/L (ref 15–41)
Albumin: 3.6 g/dL (ref 3.5–5.0)
Alkaline Phosphatase: 83 U/L (ref 38–126)
Anion gap: 16 — ABNORMAL HIGH (ref 5–15)
BUN: 71 mg/dL — ABNORMAL HIGH (ref 8–23)
CO2: 31 mmol/L (ref 22–32)
Calcium: 9.9 mg/dL (ref 8.9–10.3)
Chloride: 99 mmol/L (ref 98–111)
Creatinine, Ser: 1.9 mg/dL — ABNORMAL HIGH (ref 0.44–1.00)
GFR, Estimated: 26 mL/min — ABNORMAL LOW (ref >60)
Glucose, Bld: 122 mg/dL — ABNORMAL HIGH (ref 70–99)
Potassium: 2.4 mmol/L — CL (ref 3.5–5.1)
Sodium: 146 mmol/L — ABNORMAL HIGH (ref 135–145)
Total Bilirubin: 0.9 mg/dL (ref 0.3–1.2)
Total Protein: 6.9 g/dL (ref 6.5–8.1)

## 2021-11-28 LAB — LACTIC ACID, PLASMA: Lactic Acid, Venous: 1.9 mmol/L (ref 0.5–1.9)

## 2021-11-28 LAB — CBC
HCT: 50.6 % — ABNORMAL HIGH (ref 36.0–46.0)
Hemoglobin: 17.3 g/dL — ABNORMAL HIGH (ref 12.0–15.0)
MCH: 31.1 pg (ref 26.0–34.0)
MCHC: 34.2 g/dL (ref 30.0–36.0)
MCV: 90.8 fL (ref 80.0–100.0)
Platelets: 316 10*3/uL (ref 150–400)
RBC: 5.57 MIL/uL — ABNORMAL HIGH (ref 3.87–5.11)
RDW: 12.3 % (ref 11.5–15.5)
WBC: 37.6 10*3/uL — ABNORMAL HIGH (ref 4.0–10.5)
nRBC: 0 % (ref 0.0–0.2)

## 2021-11-28 LAB — MAGNESIUM: Magnesium: 2.7 mg/dL — ABNORMAL HIGH (ref 1.7–2.4)

## 2021-11-28 LAB — LIPASE, BLOOD: Lipase: 26 U/L (ref 11–51)

## 2021-11-28 MED ORDER — PIPERACILLIN-TAZOBACTAM 3.375 G IVPB 30 MIN
3.3750 g | Freq: Once | INTRAVENOUS | Status: AC
  Administered 2021-11-28: 3.375 g via INTRAVENOUS
  Filled 2021-11-28: qty 50

## 2021-11-28 MED ORDER — MAGNESIUM CITRATE PO SOLN
1.0000 | Freq: Once | ORAL | Status: AC
  Administered 2021-11-28: 1 via ORAL
  Filled 2021-11-28: qty 296

## 2021-11-28 MED ORDER — POTASSIUM CHLORIDE 10 MEQ/100ML IV SOLN
10.0000 meq | INTRAVENOUS | Status: AC
  Administered 2021-11-28 – 2021-11-29 (×5): 10 meq via INTRAVENOUS
  Filled 2021-11-28 (×5): qty 100

## 2021-11-28 MED ORDER — SORBITOL 70 % SOLN
100.0000 mL | TOPICAL_OIL | Freq: Once | ORAL | Status: AC
  Administered 2021-11-29: 100 mL via RECTAL
  Filled 2021-11-28: qty 30

## 2021-11-28 MED ORDER — POTASSIUM CHLORIDE 10 MEQ/100ML IV SOLN: 10.0000 meq | INTRAVENOUS | Status: DC

## 2021-11-28 MED ORDER — LACTATED RINGERS IV BOLUS
1000.0000 mL | Freq: Once | INTRAVENOUS | Status: AC
  Administered 2021-11-28: 1000 mL via INTRAVENOUS

## 2021-11-28 MED ORDER — PEG 3350-KCL-NA BICARB-NACL 420 G PO SOLR
4000.0000 mL | Freq: Once | ORAL | Status: AC
  Administered 2021-11-29: 4000 mL via ORAL
  Filled 2021-11-28: qty 4000

## 2021-11-28 MED ORDER — SORBITOL 70 % SOLN: 200.0000 mL | TOPICAL_OIL | Freq: Once | ORAL | Status: DC

## 2021-11-28 MED ORDER — POLYETHYLENE GLYCOL 3350 17 G PO PACK
17.0000 g | PACK | Freq: Two times a day (BID) | ORAL | Status: DC
  Administered 2021-11-28 – 2021-12-06 (×11): 17 g via ORAL
  Filled 2021-11-28 (×14): qty 1

## 2021-11-28 MED ORDER — BISACODYL 10 MG RE SUPP
10.0000 mg | Freq: Once | RECTAL | Status: AC
  Administered 2021-11-28: 10 mg via RECTAL
  Filled 2021-11-28: qty 1

## 2021-11-28 MED ORDER — PIPERACILLIN-TAZOBACTAM 3.375 G IVPB
3.3750 g | Freq: Once | INTRAVENOUS | Status: DC
  Filled 2021-11-28: qty 50

## 2021-11-28 MED ORDER — MAGNESIUM HYDROXIDE 400 MG/5ML PO SUSP
30.0000 mL | Freq: Once | ORAL | Status: AC
  Administered 2021-11-29: 30 mL via ORAL
  Filled 2021-11-28: qty 30

## 2021-11-28 MED ORDER — BISACODYL 5 MG PO TBEC
10.0000 mg | DELAYED_RELEASE_TABLET | Freq: Once | ORAL | Status: AC
  Administered 2021-11-28: 10 mg via ORAL
  Filled 2021-11-28: qty 2

## 2021-11-28 MED ORDER — PIPERACILLIN-TAZOBACTAM IN DEX 2-0.25 GM/50ML IV SOLN
2.2500 g | Freq: Three times a day (TID) | INTRAVENOUS | Status: DC
  Administered 2021-11-29 – 2021-12-02 (×10): 2.25 g via INTRAVENOUS
  Filled 2021-11-28 (×12): qty 50

## 2021-11-28 MED ORDER — POTASSIUM CHLORIDE 10 MEQ/100ML IV SOLN
10.0000 meq | Freq: Once | INTRAVENOUS | Status: AC
  Administered 2021-11-28: 10 meq via INTRAVENOUS
  Filled 2021-11-28: qty 100

## 2021-11-28 MED ORDER — MORPHINE SULFATE (PF) 2 MG/ML IV SOLN
1.0000 mg | INTRAVENOUS | Status: DC | PRN
  Administered 2021-11-30 – 2021-12-01 (×3): 1 mg via INTRAVENOUS
  Filled 2021-11-28 (×3): qty 1

## 2021-11-28 MED ORDER — DOCUSATE SODIUM 50 MG/5ML PO LIQD
200.0000 mg | Freq: Two times a day (BID) | ORAL | Status: DC
  Administered 2021-11-28 – 2021-12-06 (×15): 200 mg via ORAL
  Filled 2021-11-28 (×16): qty 20

## 2021-11-28 MED ORDER — SENNA 8.6 MG PO TABS
2.0000 | ORAL_TABLET | Freq: Once | ORAL | Status: AC
  Administered 2021-11-28: 17.2 mg via ORAL
  Filled 2021-11-28: qty 2

## 2021-11-28 MED ORDER — LACTATED RINGERS IV SOLN: INTRAVENOUS | Status: DC

## 2021-11-28 NOTE — ED Notes (Signed)
Transported to CT 

## 2021-11-28 NOTE — H&P (Addendum)
PCP:   Berdine Dance, PA-C   Chief Complaint:  ***  HPI: ***  Review of Systems:  The patient denies anorexia, fever, weight loss,, vision loss, decreased hearing, hoarseness, chest pain, syncope, dyspnea on exertion, peripheral edema, balance deficits, hemoptysis, abdominal pain, melena, hematochezia, severe indigestion/heartburn, hematuria, incontinence, genital sores, muscle weakness, suspicious skin lesions, transient blindness, difficulty walking, depression, unusual weight change, abnormal bleeding, enlarged lymph nodes, angioedema, and breast masses.  Past Medical History: Past Medical History:  Diagnosis Date   Acute blood loss anemia    Anxiety    Ascending aortic aneurysm (HCC)    Depression    Diverticulitis    GERD (gastroesophageal reflux disease)    Glaucoma    Heart murmur    History of hiatal hernia    Hyperlipidemia    Hypertension    Hypokalemia    Hypothyroidism    IBS (irritable bowel syndrome)    Insomnia    Leukocytosis    Osteoporosis    Physical deconditioning    Stomach problems    Turner's syndrome    Vision disorder    Vitamin D deficiency    Past Surgical History:  Procedure Laterality Date   ABDOMINAL SURGERY  1995   1970s  cyst from abdomen removed   APPENDECTOMY     CARDIAC CATHETERIZATION N/A 06/03/2015   Procedure: Left Heart Cath and Coronary Angiography;  Surgeon: Corky Crafts, MD;  Location: [facility] INVASIVE CV LAB;  Service: Cardiovascular;  Laterality: N/A;   CATARACT EXTRACTION W/ INTRAOCULAR LENS  IMPLANT, BILATERAL     EYE SURGERY     left eye muscle     age 81   GLAUCOMA VALVE INSERTION     right eye   OTHER SURGICAL HISTORY  2010    ear surgery   REPLACEMENT ASCENDING AORTA N/A 06/28/2015   Procedure: REPLACEMENT ASCENDING AORTA AND ARCH;  Surgeon: Loreli Slot, MD;  Location: [facility] OR;  Service: Open Heart Surgery;  Laterality: N/A;   TEE WITHOUT CARDIOVERSION N/A 06/28/2015   Procedure: TRANSESOPHAGEAL  ECHOCARDIOGRAM (TEE);  Surgeon: Loreli Slot, MD;  Location: [facility] OR;  Service: Open Heart Surgery;  Laterality: N/A;   TONSILLECTOMY     t+a   TRANSTHORACIC ECHOCARDIOGRAM  08/2011   EF=/> 55%, LA mildly dilated, midl mitral annular calcification, trace MR, trace TR, mild AV calcification w/mod regurg, trace pulm valve regurg    Medications: Prior to Admission medications   Medication Sig Start Date End Date Taking? Authorizing Provider  acetaminophen (TYLENOL) 500 MG tablet Take 500 mg by mouth daily.    [provider]  alendronate (FOSAMAX) 70 MG tablet Take 70 mg by mouth once a week.    [provider]  ALPHAGAN P 0.1 % SOLN Place 1 drop into the right eye 2 (two) times daily. 04/09/19   [provider]  Ascorbic Acid (VITAMIN C) 1000 MG tablet Take 3,000 mg by mouth daily.     [provider]  aspirin EC 81 MG tablet Take 1 tablet (81 mg total) by mouth daily. 12/04/17   Hilty, Lisette Abu, MD  bisacodyl (DULCOLAX) 5 MG EC tablet 4 tablets Patient not taking: Reported on 05/23/2021 01/30/20   [provider]  calcium-vitamin D (OSCAL WITH D) 500-200 MG-UNIT tablet Take 1 tablet by mouth daily with breakfast.    [provider]  dorzolamide (TRUSOPT) 2 % ophthalmic solution Place 1 drop into both eyes 2 (two) times daily.    [provider]  latanoprost (XALATAN) 0.005 % ophthalmic solution Place 1 drop into the right eye daily. Patient not taking: Reported on 05/23/2021 11/23/17   [provider]  levothyroxine (SYNTHROID, LEVOTHROID) 88 MCG tablet Take 88 mcg by mouth every morning.     [provider]  lisinopril (PRINIVIL,ZESTRIL) 20 MG tablet Take 1 tablet (20 mg total) by mouth daily. 07/05/15   Gold, Wayne E, PA-C  mirtazapine (REMERON) 45 MG tablet Take 45 mg by mouth at bedtime. Patient not taking: Reported on 05/23/2021    [provider]  Multiple Vitamin (MULTIVITAMIN) tablet Take 1  tablet by mouth daily.    [provider]  OVER THE COUNTER MEDICATION IB Guard twice daily    [provider]  psyllium (METAMUCIL) 58.6 % powder Take 1 packet by mouth.    [provider]  ROCKLATAN 0.02-0.005 % SOLN SMARTSIG:1 Drop(s) Left Eye Every Evening 03/25/19   [provider]  simvastatin (ZOCOR) 40 MG tablet Take 40 mg by mouth every morning.     [provider]  temazepam (RESTORIL) 15 MG capsule Take 15 mg by mouth at bedtime.     [provider]    Allergies:   Allergies  Allergen Reactions   Remeron [Mirtazapine] Other (See Comments)    Other reaction(s): Unknown Did not work   Shellfish-Derived Products Nausea And Vomiting    Clam, oyster    Social History:  reports that she has quit smoking. Her smoking use included cigarettes. She has never used smokeless tobacco. She reports current alcohol use. She reports that she does not use drugs.  Family History: Family History  Problem Relation Age of Onset   Heart attack Father    Heart attack Maternal Grandfather    Skin cancer Brother     Physical Exam: Vitals:   12/08/2021 2100 11/22/2021 2130 11/27/2021 2200 11/21/2021 2215  BP: (!) 86/57 102/69 104/71 92/63  Pulse: 93 88 90 90  Resp: 19 19 18 20   Temp:      TempSrc:      SpO2: 96% 97% 97% 97%    General:  Alert and oriented times three, well developed and nourished, no acute distress Eyes: PERRLA, pink conjunctiva, no scleral icterus ENT: Moist oral mucosa, neck supple, no thyromegaly Lungs: clear to ascultation, no wheeze, no crackles, no use of accessory muscles Cardiovascular: regular rate and rhythm, no regurgitation, no gallops, no murmurs. No carotid bruits, no JVD Abdomen: soft, positive BS, non-tender, non-distended, no organomegaly, not an acute abdomen GU: not examined Neuro: CN II - XII grossly intact, sensation intact Musculoskeletal: strength 5/5 all extremities, no clubbing, cyanosis or  edema Skin: no rash, no subcutaneous crepitation, no decubitus Psych: appropriate patient   Labs on Admission:  Recent Labs    11/16/2021 1800 12/01/2021 1832  NA 146*  --   K 2.4*  --   CL 99  --   CO2 31  --   GLUCOSE 122*  --   BUN 71*  --   CREATININE 1.90*  --   CALCIUM 9.9  --   MG  --  2.7*   Recent Labs    11/14/2021 1800  AST 21  ALT 22  ALKPHOS 83  BILITOT 0.9  PROT 6.9  ALBUMIN 3.6   Recent Labs    11/15/2021 1800  LIPASE 26   Recent Labs    12/08/2021 1800  WBC 37.6*  HGB 17.3*  HCT 50.6*  MCV 90.8  PLT 316  Radiological Exams on Admission: CT ABDOMEN PELVIS WO CONTRAST  Result Date: 11/10/2021 CLINICAL DATA:  Concern for bowel obstruction. EXAM: CT ABDOMEN AND PELVIS WITHOUT CONTRAST TECHNIQUE: Multidetector CT imaging of the abdomen and pelvis was performed following the standard protocol without IV contrast. RADIATION DOSE REDUCTION: This exam was performed according to the departmental dose-optimization program which includes automated exposure control, adjustment of the mA and/or kV according to patient size and/or use of iterative reconstruction technique. COMPARISON:  CT abdomen pelvis dated 08/10/2020. FINDINGS: Evaluation of this exam is limited in the absence of intravenous contrast. Evaluation is also limited due to streak artifact caused by surgical clips and overlying support wires. Lower chest: The visualized lung bases are clear. There is coronary vascular calcification. There is a small pneumoperitoneum.  No free fluid. Hepatobiliary: The liver is grossly unremarkable. Small layering gallstones. No pericholecystic fluid or evidence of acute cholecystitis. Pancreas: Pancreas appears atrophic. No active inflammatory changes. Spleen: Irregular splenic contour with areas of capsular calcification, likely sequela of prior insult. 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  There is no hydronephrosis or nephrolithiasis on either side. The urinary bladder is unremarkable. Stomach/Bowel: Large amount of dense stool noted throughout the colon and within the sigmoid. There is sigmoid diverticulosis. There is diffuse thickening of the wall of the sigmoid colon with diffuse perisigmoid stranding which may be chronic. The possibility of stercoral colitis is not excluded. Clinical correlation is recommended. No evidence of bowel obstruction. The appendix is not visualized with certainty. No inflammatory changes identified in the right lower quadrant. Vascular/Lymphatic: Moderate aortoiliac atherosclerotic disease. The IVC is grossly unremarkable. No obvious portal venous gas. No adenopathy. Reproductive: The uterus is anteverted. Small foci of calcification within the uterus. No adnexal masses. Other: Midline vertical anterior pelvic wall incisional scar. Musculoskeletal: Osteopenia with scoliosis and degenerative changes. Partially visualized median sternotomy. Right S1 Tarlov cyst. No acute osseous pathology. IMPRESSION: 1. Small pneumoperitoneum. In the absence of history of recent surgery, findings concerning for bowel perforation. Surgical consult is advised. 2. Sigmoid diverticulosis. Diffuse thickening of the wall of the sigmoid colon with diffuse perisigmoid stranding. Findings may be chronic, however; the possibility of stercoral colitis is not excluded. No evidence of bowel obstruction. 3. Cholelithiasis. 4. Horseshoe kidney. 5.  Aortic Atherosclerosis (ICD10-I70.0). These results were called by telephone at the time of interpretation on 11/13/2021 at 8:43 pm to provider COOPER ROBBINS , who verbally acknowledged these results. Electronically Signed   By: Elgie Collard M.D.   On: 12/03/2021 20:44    Assessment/Plan Present on Admission:  Pneumoperitoneum -   Essential hypertension -   Dyslipidemia -   Thyroid nodule  Hypokalemia   Kristi Erickson 11/30/2021, 11:04  PM

## 2021-11-28 NOTE — ED Provider Notes (Signed)
[facility] EMERGENCY DEPARTMENT Provider Note   CSN: [id number] Arrival date & time: 11/20/2021  1649     History {Add pertinent medical, surgical, social history, OB history to HPI:1} Chief Complaint  Patient presents with   Abdominal Pain    Kristi Erickson is a 81 y.o. female.   Abdominal Pain  The patient is a 81 year old female with past medical history of aortic insufficiency, hypertension, HLD, hypothyroidism, Turner syndrome, horseshoe kidney who is presenting for evaluation of a possible SBO.  Per the patient and her brother she has had decreased p.o. intake for the past 10 days.  She has not had any solid foods but has been consuming liquids.  She has also not had a bowel movement in the past 10 days.  The patient is endorsing nausea without vomiting, and diffuse abdominal pain .  She has no associated fevers, cough, chest pain, shortness of breath, and has not had any recent changes to medications.   Home Medications Prior to Admission medications   Medication Sig Start Date End Date Taking? Authorizing Provider  acetaminophen (TYLENOL) 500 MG tablet Take 500 mg by mouth daily.    [provider]  alendronate (FOSAMAX) 70 MG tablet Take 70 mg by mouth once a week.    [provider]  ALPHAGAN P 0.1 % SOLN Place 1 drop into the right eye 2 (two) times daily. 04/09/19   [provider]  Ascorbic Acid (VITAMIN C) 1000 MG tablet Take 3,000 mg by mouth daily.     [provider]  aspirin EC 81 MG tablet Take 1 tablet (81 mg total) by mouth daily. 12/04/17   Hilty, Lisette Abu, MD  bisacodyl (DULCOLAX) 5 MG EC tablet 4 tablets Patient not taking: Reported on 05/23/2021 01/30/20   [provider]  calcium-vitamin D (OSCAL WITH D) 500-200 MG-UNIT tablet Take 1 tablet by mouth daily with breakfast.    [provider]  dorzolamide (TRUSOPT) 2 % ophthalmic solution Place 1 drop into both eyes 2 (two) times daily.     [provider]  latanoprost (XALATAN) 0.005 % ophthalmic solution Place 1 drop into the right eye daily. Patient not taking: Reported on 05/23/2021 11/23/17   [provider]  levothyroxine (SYNTHROID, LEVOTHROID) 88 MCG tablet Take 88 mcg by mouth every morning.     [provider]  lisinopril (PRINIVIL,ZESTRIL) 20 MG tablet Take 1 tablet (20 mg total) by mouth daily. 07/05/15   Gold, Wayne E, PA-C  mirtazapine (REMERON) 45 MG tablet Take 45 mg by mouth at bedtime. Patient not taking: Reported on 05/23/2021    [provider]  Multiple Vitamin (MULTIVITAMIN) tablet Take 1 tablet by mouth daily.    [provider]  OVER THE COUNTER MEDICATION IB Guard twice daily    [provider]  psyllium (METAMUCIL) 58.6 % powder Take 1 packet by mouth.    [provider]  ROCKLATAN 0.02-0.005 % SOLN SMARTSIG:1 Drop(s) Left Eye Every Evening 03/25/19   [provider]  simvastatin (ZOCOR) 40 MG tablet Take 40 mg by mouth every morning.     [provider]  temazepam (RESTORIL) 15 MG capsule Take 15 mg by mouth at bedtime.     [provider]      Allergies    Remeron [mirtazapine] and Shellfish-derived products    Review of Systems   Review of Systems  Gastrointestinal:  Positive for abdominal pain.    Physical Exam Updated Vital Signs  BP 104/71   Pulse 90   Temp 97.6 F (36.4 C) (Axillary)   Resp 18   SpO2 97%  Physical Exam Vitals and nursing note reviewed.  Constitutional:      General: She is not in acute distress.    Appearance: She is well-developed.  HENT:     Head: Normocephalic and atraumatic.     Mouth/Throat:     Comments: Dry mucous membranes Eyes:     Conjunctiva/sclera: Conjunctivae normal.  Cardiovascular:     Rate and Rhythm: Normal rate and regular rhythm.     Heart sounds: No murmur heard. Pulmonary:     Effort: Pulmonary effort is normal. No respiratory distress.     Breath  sounds: Normal breath sounds.  Abdominal:     Palpations: Abdomen is soft.     Comments: Diffuse generalized abdominal tenderness to palpation.  No guarding or rigidity  Musculoskeletal:        General: No swelling.     Cervical back: Neck supple.  Skin:    General: Skin is warm and dry.     Capillary Refill: Capillary refill takes less than 2 seconds.  Neurological:     Mental Status: She is alert.  Psychiatric:        Mood and Affect: Mood normal.     ED Results / Procedures / Treatments   Labs (all labs ordered are listed, but only abnormal results are displayed) Labs Reviewed  COMPREHENSIVE METABOLIC PANEL - Abnormal; Notable for the following components:      Result Value   Sodium 146 (*)    Potassium 2.4 (*)    Glucose, Bld 122 (*)    BUN 71 (*)    Creatinine, Ser 1.90 (*)    GFR, Estimated 26 (*)    Anion gap 16 (*)    All other components within normal limits  CBC - Abnormal; Notable for the following components:   WBC 37.6 (*)    RBC 5.57 (*)    Hemoglobin 17.3 (*)    HCT 50.6 (*)    All other components within normal limits  MAGNESIUM - Abnormal; Notable for the following components:   Magnesium 2.7 (*)    All other components within normal limits  CULTURE, BLOOD (ROUTINE X 2)  CULTURE, BLOOD (ROUTINE X 2)  LIPASE, BLOOD  LACTIC ACID, PLASMA  URINALYSIS, ROUTINE W REFLEX MICROSCOPIC  LACTIC ACID, PLASMA    EKG None  Radiology CT ABDOMEN PELVIS WO CONTRAST  Result Date: 12/03/2021 CLINICAL DATA:  Concern for bowel obstruction. EXAM: CT ABDOMEN AND PELVIS WITHOUT CONTRAST TECHNIQUE: Multidetector CT imaging of the abdomen and pelvis was performed following the standard protocol without IV contrast. RADIATION DOSE REDUCTION: This exam was performed according to the departmental dose-optimization program which includes automated exposure control, adjustment of the mA and/or kV according to patient size and/or use of iterative reconstruction technique.  COMPARISON:  CT abdomen pelvis dated 08/10/2020. FINDINGS: Evaluation of this exam is limited in the absence of intravenous contrast. Evaluation is also limited due to streak artifact caused by surgical clips and overlying support wires. Lower chest: The visualized lung bases are clear. There is coronary vascular calcification. There is a small pneumoperitoneum.  No free fluid. Hepatobiliary: The liver is grossly unremarkable. Small layering gallstones. No pericholecystic fluid or evidence of acute cholecystitis. Pancreas: Pancreas appears atrophic. No active inflammatory changes. Spleen: Irregular splenic contour with areas of capsular calcification, likely sequela of prior insult. 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 There is no hydronephrosis or nephrolithiasis on either side. The urinary bladder is unremarkable. Stomach/Bowel: Large amount of dense stool noted throughout the colon and within the sigmoid. There is sigmoid diverticulosis. There is diffuse thickening of the wall of the sigmoid colon with diffuse perisigmoid stranding which may be chronic. The possibility of stercoral colitis is not excluded. Clinical correlation is recommended. No evidence of bowel obstruction. The appendix is not visualized with certainty. No inflammatory changes identified in the right lower quadrant. Vascular/Lymphatic: Moderate aortoiliac atherosclerotic disease. The IVC is grossly unremarkable. No obvious portal venous gas. No adenopathy. Reproductive: The uterus is anteverted. Small foci of calcification within the uterus. No adnexal masses. Other: Midline vertical anterior pelvic wall incisional scar. Musculoskeletal: Osteopenia with scoliosis and degenerative changes. Partially visualized median sternotomy. Right S1 Tarlov cyst. No acute osseous pathology. IMPRESSION: 1. Small pneumoperitoneum. In the absence of history of recent surgery,  findings concerning for bowel perforation. Surgical consult is advised. 2. Sigmoid diverticulosis. Diffuse thickening of the wall of the sigmoid colon with diffuse perisigmoid stranding. Findings may be chronic, however; the possibility of stercoral colitis is not excluded. No evidence of bowel obstruction. 3. Cholelithiasis. 4. Horseshoe kidney. 5.  Aortic Atherosclerosis (ICD10-I70.0). These results were called by telephone at the time of interpretation on 2021/12/03 at 8:43 pm to provider COOPER ROBBINS , who verbally acknowledged these results. Electronically Signed   By: Elgie Collard M.D.   On: 12-03-2021 20:44    Procedures Procedures  {Document cardiac monitor, telemetry assessment procedure when appropriate:1}  Medications Ordered in ED Medications  magnesium hydroxide (MILK OF MAGNESIA) suspension 30 mL (has no administration in time range)  magnesium citrate solution 1 Bottle (has no administration in time range)  senna (SENOKOT) tablet 17.2 mg (has no administration in time range)  bisacodyl (DULCOLAX) suppository 10 mg (has no administration in time range)  bisacodyl (DULCOLAX) EC tablet 10 mg (has no administration in time range)  polyethylene glycol (MIRALAX / GLYCOLAX) packet 17 g (has no administration in time range)  polyethylene glycol-electrolytes (NuLYTELY) solution 4,000 mL (has no administration in time range)  docusate (COLACE) 50 MG/5ML liquid 200 mg (has no administration in time range)  sorbitol, milk of mag, mineral oil, glycerin (SMOG) enema (has no administration in time range)  lactated ringers bolus 1,000 mL (1,000 mLs Intravenous New Bag/Given 12/03/2021 2037)  potassium chloride 10 mEq in 100 mL IVPB (10 mEq Intravenous New Bag/Given 12/03/21 2037)  piperacillin-tazobactam (ZOSYN) IVPB 3.375 g (3.375 g Intravenous New Bag/Given 2021/12/03 2126)    ED Course/ Medical Decision Making/ A&P                           Medical Decision Making  The patient is a  81 year old female with past medical history of aortic insufficiency, hypertension, HLD, hypothyroidism, Turner syndrome, horseshoe kidney who is presenting for evaluation of a possible SBO.  Differential diagnosis considered includes: SBO, constipation, PUD, GERD, cholecystitis, appendicitis, bowel mesenteric ischemia, IBD, hepatitis.  On initial exam, the patient is hypotensive with a blood pressure of 97/67 but afebrile.  Physical exam is significant for extremely dry mucous membranes and prolonged capillary refill as well as diffuse abdominal tenderness to palpation.  The patient's diagnostic work-up included a CT abdomen pelvis which showed small pneumoperitoneum concerning for bowel perforation as well as sigmoid diverticulosis without diverticulitis, and cholelithiasis.  The patient also received a CBC  significant for markedly elevated white blood cell count of 37.6 with hemoglobin of 17.3; CMP with sodium 146, potassium 2.4, creatinine elevated to 1.90 from baseline of 0.69, and anion gap of 16.  Urinalysis was also ordered which was pending.  Based on the patient's history, physical exam, and diagnostic work-up a perforated bowel is favored the likely source of her abdominal pain.  There was no evidence of obstruction noted on imaging.  While in the emergency department patient was given a bolus of IV fluids, her potassium was repleted, and she was started on Zosyn.  Surgery was consulted for evaluation of the patient's pneumoperitoneum.   After talking with the surgeon, the patient stated that she did not want to discuss surgery any further this evening because she "did not like the way she looks" in reference to the surgeon.  The patient was admitted to medicine for further management.  Amount and/or Complexity of Data Reviewed Independent Historian: caregiver    Details: Brother External Data Reviewed: notes. Labs: ordered. Discussion of management or test interpretation with external  provider(s): Surgery, hospital medicine  Risk Prescription drug management.   ***  {Document critical care time when appropriate:1} {Document review of labs and clinical decision tools ie heart score, Chads2Vasc2 etc:1}  {Document your independent review of radiology images, and any outside records:1} {Document your discussion with family members, caretakers, and with consultants:1} {Document social determinants of health affecting pt's care:1} {Document your decision making why or why not admission, treatments were needed:1} Final Clinical Impression(s) / ED Diagnoses Final diagnoses:  Generalized abdominal pain  Pneumoperitoneum    Rx / DC Orders ED Discharge Orders     None

## 2021-11-28 NOTE — ED Provider Triage Note (Signed)
Emergency Medicine Provider Triage Evaluation Note  Kristi Erickson , a 81 y.o. female  was evaluated in triage.  Pt complains of abdominal pain.  Patient states that she is followed by [facility] gastroenterology outpatient.  X-ray was performed within the past 2 days.  Since the emergency department with concerns for intestinal obstruction.  Decreased p.o. intake over the past 10 to 11 days.  No bowel movements in the same amount of time.  Reports associated nausea and increasing abdominal pain with consumption of food/liquids.  Denies fever, chills, night sweats.  Review of Systems  Positive: See above Negative:   Physical Exam  BP (!) 98/59   Pulse 91   Temp 97.9 F (36.6 C) (Oral)   Resp 14   SpO2 98%  Gen:   Awake, no distress   Resp:  Normal effort  MSK:   Moves extremities without difficulty  Other:  Diffuse abdominal tenderness  Medical Decision Making  Medically screening exam initiated at 5:57 PM.  Appropriate orders placed.  JEANNEMARIE SAWAYA was informed that the remainder of the evaluation will be completed by another provider, this initial triage assessment does not replace that evaluation, and the importance of remaining in the ED until their evaluation is complete.     Peter Garter, [place] December 16, 2021 1807

## 2021-11-28 NOTE — ED Notes (Signed)
Enema bag ordered, awaiting device to admin

## 2021-11-28 NOTE — ED Triage Notes (Signed)
Patient brought in by brother, patient states she was referred to ED for evaluation of an intestinal obstruction. Patient states she has had poor oral intake over the last 11 days.

## 2021-11-28 NOTE — Consult Note (Addendum)
Reason for Consult/Chief Complaint: constipation, pneumoperitoneum Consultant: Freda Munro, MD  Kristi Erickson is an 81 y.o. female.   HPI: 9F with 10d of no BM. Has been taking colace at home without relief.  Past Medical History:  Diagnosis Date   Acute blood loss anemia    Anxiety    Ascending aortic aneurysm (HCC)    Depression    Diverticulitis    GERD (gastroesophageal reflux disease)    Glaucoma    Heart murmur    History of hiatal hernia    Hyperlipidemia    Hypertension    Hypokalemia    Hypothyroidism    IBS (irritable bowel syndrome)    Insomnia    Leukocytosis    Osteoporosis    Physical deconditioning    Stomach problems    Turner's syndrome    Vision disorder    Vitamin D deficiency     Past Surgical History:  Procedure Laterality Date   ABDOMINAL SURGERY  1995   1970s  cyst from abdomen removed   APPENDECTOMY     CARDIAC CATHETERIZATION N/A 06/03/2015   Procedure: Left Heart Cath and Coronary Angiography;  Surgeon: Jettie Booze, MD;  Location: [facility];  Service: Cardiovascular;  Laterality: N/A;   CATARACT EXTRACTION W/ INTRAOCULAR LENS  IMPLANT, BILATERAL     EYE SURGERY     left eye muscle     age 69   GLAUCOMA VALVE INSERTION     right eye   OTHER SURGICAL HISTORY  2010    ear surgery   REPLACEMENT ASCENDING AORTA N/A 06/28/2015   Procedure: REPLACEMENT ASCENDING AORTA AND ARCH;  Surgeon: Melrose Nakayama, MD;  Location: [facility];  Service: Open Heart Surgery;  Laterality: N/A;   TEE WITHOUT CARDIOVERSION N/A 06/28/2015   Procedure: TRANSESOPHAGEAL ECHOCARDIOGRAM (TEE);  Surgeon: Melrose Nakayama, MD;  Location: [facility];  Service: Open Heart Surgery;  Laterality: N/A;   TONSILLECTOMY     t+a   TRANSTHORACIC ECHOCARDIOGRAM  08/2011   EF=/> 55%, LA mildly dilated, midl mitral annular calcification, trace MR, trace TR, mild AV calcification w/mod regurg, trace pulm valve regurg    Family History  Problem Relation Age of  Onset   Heart attack Father    Heart attack Maternal Grandfather    Skin cancer Brother     Social History:  reports that she has quit smoking. Her smoking use included cigarettes. She has never used smokeless tobacco. She reports current alcohol use. She reports that she does not use drugs.  Allergies:  Allergies  Allergen Reactions   Remeron [Mirtazapine] Other (See Comments)    Other reaction(s): Unknown Did not work   Shellfish-Derived Products Nausea And Vomiting    Clam, oyster    Medications: I have reviewed the patient's current medications.  Results for orders placed or performed during the hospital encounter of 11/27/2021 (from the past 48 hour(s))  Comprehensive metabolic panel     Status: Abnormal   Collection Time: 11/17/2021  6:00 PM  Result Value Ref Range   Sodium 146 (H) 135 - 145 mmol/L   Potassium 2.4 (LL) 3.5 - 5.1 mmol/L    Comment: CRITICAL RESULT CALLED TO, READ BACK BY AND VERIFIED WITH  K. OWENS RN 11/11/2021 @1911  BY J.WHITE   Chloride 99 98 - 111 mmol/L   CO2 31 22 - 32 mmol/L   Glucose, Bld 122 (H) 70 - 99 mg/dL    Comment: Glucose reference range applies only to  samples taken after fasting for at least 8 hours.   BUN 71 (H) 8 - 23 mg/dL   Creatinine, Ser 5.94 (H) 0.44 - 1.00 mg/dL   Calcium 9.9 8.9 - 58.5 mg/dL   Total Protein 6.9 6.5 - 8.1 g/dL   Albumin 3.6 3.5 - 5.0 g/dL   AST 21 15 - 41 U/L   ALT 22 0 - 44 U/L   Alkaline Phosphatase 83 38 - 126 U/L   Total Bilirubin 0.9 0.3 - 1.2 mg/dL   GFR, Estimated 26 (L) >60 mL/min    Comment: (NOTE) Calculated using the CKD-EPI Creatinine Equation (2021)    Anion gap 16 (H) 5 - 15    Comment: Performed at [facility], [address] [place], [place] [address]  CBC     Status: Abnormal   Collection Time: 12/03/2021  6:00 PM  Result Value Ref Range   WBC 37.6 (H) 4.0 - 10.5 K/uL   RBC 5.57 (H) 3.87 - 5.11 MIL/uL   Hemoglobin 17.3 (H) 12.0 - 15.0 g/dL   HCT 46.2 (H) 86.3 - 81.7 %   MCV 90.8 80.0  - 100.0 fL   MCH 31.1 26.0 - 34.0 pg   MCHC 34.2 30.0 - 36.0 g/dL   RDW 71.1 65.7 - 90.3 %   Platelets 316 150 - 400 K/uL   nRBC 0.0 0.0 - 0.2 %    Comment: Performed at [facility], [address], [place], [place] [address]  Lipase, blood     Status: None   Collection Time: 11/25/2021  6:00 PM  Result Value Ref Range   Lipase 26 11 - 51 U/L    Comment: Performed at [facility], [address] [place], [place] [address]  Magnesium     Status: Abnormal   Collection Time: 11/12/2021  6:32 PM  Result Value Ref Range   Magnesium 2.7 (H) 1.7 - 2.4 mg/dL    Comment: Performed at [facility], [address], [place], [place] [address]    CT ABDOMEN PELVIS WO CONTRAST  Result Date: 11/14/2021 CLINICAL DATA:  Concern for bowel obstruction. EXAM: CT ABDOMEN AND PELVIS WITHOUT CONTRAST TECHNIQUE: Multidetector CT imaging of the abdomen and pelvis was performed following the standard protocol without IV contrast. RADIATION DOSE REDUCTION: This exam was performed according to the departmental dose-optimization program which includes automated exposure control, adjustment of the mA and/or kV according to patient size and/or use of iterative reconstruction technique. COMPARISON:  CT abdomen pelvis dated 08/10/2020. FINDINGS: Evaluation of this exam is limited in the absence of intravenous contrast. Evaluation is also limited due to streak artifact caused by surgical clips and overlying support wires. Lower chest: The visualized lung bases are clear. There is coronary vascular calcification. There is a small pneumoperitoneum.  No free fluid. Hepatobiliary: The liver is grossly unremarkable. Small layering gallstones. No pericholecystic fluid or evidence of acute cholecystitis. Pancreas: Pancreas appears atrophic. No active inflammatory changes. Spleen: Irregular splenic contour with areas of capsular calcification, likely sequela of prior insult. 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 There is no hydronephrosis or nephrolithiasis on either side. The urinary bladder is unremarkable. Stomach/Bowel: Large amount of dense stool noted throughout the colon and within the sigmoid. There is sigmoid diverticulosis. There is diffuse thickening of the wall of the sigmoid colon with diffuse perisigmoid stranding which may be chronic. The possibility of stercoral colitis is  not excluded. Clinical correlation is recommended. No evidence of bowel obstruction. The appendix is not visualized with certainty. No inflammatory changes identified in the right lower quadrant. Vascular/Lymphatic: Moderate aortoiliac atherosclerotic disease. The IVC is grossly unremarkable. No obvious portal venous gas. No adenopathy. Reproductive: The uterus is anteverted. Small foci of calcification within the uterus. No adnexal masses. Other: Midline vertical anterior pelvic wall incisional scar. Musculoskeletal: Osteopenia with scoliosis and degenerative changes. Partially visualized median sternotomy. Right S1 Tarlov cyst. No acute osseous pathology. IMPRESSION: 1. Small pneumoperitoneum. In the absence of history of recent surgery, findings concerning for bowel perforation. Surgical consult is advised. 2. Sigmoid diverticulosis. Diffuse thickening of the wall of the sigmoid colon with diffuse perisigmoid stranding. Findings may be chronic, however; the possibility of stercoral colitis is not excluded. No evidence of bowel obstruction. 3. Cholelithiasis. 4. Horseshoe kidney. 5.  Aortic Atherosclerosis (ICD10-I70.0). These results were called by telephone at the time of interpretation on 12/12/2021 at 8:43 pm to provider COOPER ROBBINS , who verbally acknowledged these results. Electronically Signed   By: Elgie Collard M.D.   On: 2021/12/12 20:44    ROS 10 point review of systems is negative except as listed above in HPI.   Physical Exam Blood  pressure 102/69, pulse 88, temperature 97.6 F (36.4 C), temperature source Axillary, resp. rate 19, SpO2 97 %. Constitutional: well-developed, well-nourished HEENT: pupils equal, round, reactive to light, 19mm b/l, moist conjunctiva, external inspection of ears and nose normal, hearing diminished Oropharynx: normal oropharyngeal mucosa, poor dentition Neck: no thyromegaly, trachea midline, no midline cervical tenderness to palpation Chest: breath sounds equal bilaterally, normal respiratory effort, no midline or lateral chest wall tenderness to palpation/deformity Abdomen: soft, distended, RLQ TTP, lower midline scar, no bruising, no hepatosplenomegaly GU: normal female genitalia  Back: no wounds, no thoracic/lumbar spine tenderness to palpation, no thoracic/lumbar spine stepoffs Rectal: deferred Extremities: 2+ radial and pedal pulses bilaterally, intact motor and sensation bilateral UE and LE, no peripheral edema MSK: unable to assess gait/station, no clubbing/cyanosis of fingers/toes, normal ROM of all four extremities Skin: warm, dry, no rashes Psych: normal memory, normal mood/affect     Assessment/Plan: 71F with chronic constipation, no BM in 10d and small volume pneumoperitoneum on CT. No peritoneal signs on clinical exam. Vitals normal. Labwork deranged: WBC 38, hgb 17, K 2.4. BUN 71, creatinine 1.9. Recommend medicine admission, hydration, NGT placement, and initiation of aggressive bowel regimen. Continue NPO except sips with meds. Serial abdominal exams.Consider early nutritional supplementation with TPN. May ultimately require surgery, but no imminent indication at this moment. On discussion with patient regarding indications for surgery and whether or not she would be agreeable to surgery, she requested a "different admitting surgeon". Further attempts to clarify this resulted in her request to speak to the ED physician again. Upon ED physician return to the bedside, she dismissed me  from the room. Discussion with the ED physician revealed that the patient declines any surgical interventions by me because she "does not like the way I look." Will continue to monitor.   Diamantina Monks, MD General and Trauma Surgery [facility]

## 2021-11-29 ENCOUNTER — Other Ambulatory Visit: Payer: Self-pay

## 2021-11-29 ENCOUNTER — Encounter (HOSPITAL_COMMUNITY): Payer: Self-pay | Admitting: Family Medicine

## 2021-11-29 DIAGNOSIS — K668 Other specified disorders of peritoneum: Secondary | ICD-10-CM | POA: Diagnosis not present

## 2021-11-29 DIAGNOSIS — E876 Hypokalemia: Secondary | ICD-10-CM | POA: Diagnosis not present

## 2021-11-29 DIAGNOSIS — N179 Acute kidney failure, unspecified: Secondary | ICD-10-CM | POA: Diagnosis not present

## 2021-11-29 LAB — CBC WITH DIFFERENTIAL/PLATELET
Abs Granulocyte: 30.2 10*3/uL — ABNORMAL HIGH (ref 1.5–6.5)
Abs Immature Granulocytes: 0.92 10*3/uL — ABNORMAL HIGH (ref 0.00–0.07)
Basophils Absolute: 0.1 10*3/uL (ref 0.0–0.1)
Basophils Relative: 0 %
Eosinophils Absolute: 2.7 10*3/uL — ABNORMAL HIGH (ref 0.0–0.5)
Eosinophils Relative: 0 %
HCT: 45.6 % (ref 36.0–46.0)
Hemoglobin: 15.4 g/dL — ABNORMAL HIGH (ref 12.0–15.0)
Immature Granulocytes: 0 %
Lymphocytes Relative: 1 %
Lymphs Abs: 0.5 10*3/uL — ABNORMAL LOW (ref 0.7–4.0)
MCH: 31.1 pg (ref 26.0–34.0)
MCHC: 33.8 g/dL (ref 30.0–36.0)
MCV: 92.1 fL (ref 80.0–100.0)
Monocytes Absolute: 1.7 10*3/uL — ABNORMAL HIGH (ref 0.1–1.0)
Monocytes Relative: 5 %
Neutro Abs: 30.2 10*3/uL — ABNORMAL HIGH (ref 1.7–7.7)
Neutrophils Relative %: 94 %
Platelets: 285 10*3/uL (ref 150–400)
RBC: 4.95 MIL/uL (ref 3.87–5.11)
RDW: 12.7 % (ref 11.5–15.5)
WBC: 36 10*3/uL — ABNORMAL HIGH (ref 4.0–10.5)
nRBC: 0 % (ref 0.0–0.2)

## 2021-11-29 LAB — URINALYSIS, ROUTINE W REFLEX MICROSCOPIC
Bilirubin Urine: NEGATIVE
Glucose, UA: NEGATIVE mg/dL
Hgb urine dipstick: NEGATIVE
Ketones, ur: 5 mg/dL — AB
Nitrite: NEGATIVE
Protein, ur: 30 mg/dL — AB
Specific Gravity, Urine: 1.019 (ref 1.005–1.030)
pH: 5 (ref 5.0–8.0)

## 2021-11-29 LAB — BASIC METABOLIC PANEL
Anion gap: 13 (ref 5–15)
BUN: 69 mg/dL — ABNORMAL HIGH (ref 8–23)
CO2: 29 mmol/L (ref 22–32)
Calcium: 8.9 mg/dL (ref 8.9–10.3)
Chloride: 99 mmol/L (ref 98–111)
Creatinine, Ser: 1.84 mg/dL — ABNORMAL HIGH (ref 0.44–1.00)
GFR, Estimated: 27 mL/min — ABNORMAL LOW (ref >60)
Glucose, Bld: 121 mg/dL — ABNORMAL HIGH (ref 70–99)
Potassium: 3.3 mmol/L — ABNORMAL LOW (ref 3.5–5.1)
Sodium: 141 mmol/L (ref 135–145)

## 2021-11-29 LAB — COMPREHENSIVE METABOLIC PANEL
ALT: 12 U/L (ref 0–44)
ALT: 17 U/L (ref 0–44)
AST: 15 U/L (ref 15–41)
AST: 20 U/L (ref 15–41)
Albumin: 1.6 g/dL — ABNORMAL LOW (ref 3.5–5.0)
Albumin: 2.5 g/dL — ABNORMAL LOW (ref 3.5–5.0)
Alkaline Phosphatase: 42 U/L (ref 38–126)
Alkaline Phosphatase: 59 U/L (ref 38–126)
Anion gap: 11 (ref 5–15)
Anion gap: 15 (ref 5–15)
BUN: 53 mg/dL — ABNORMAL HIGH (ref 8–23)
BUN: 72 mg/dL — ABNORMAL HIGH (ref 8–23)
CO2: 18 mmol/L — ABNORMAL LOW (ref 22–32)
CO2: 27 mmol/L (ref 22–32)
Calcium: 6.1 mg/dL — CL (ref 8.9–10.3)
Calcium: 9 mg/dL (ref 8.9–10.3)
Chloride: 115 mmol/L — ABNORMAL HIGH (ref 98–111)
Chloride: 99 mmol/L (ref 98–111)
Creatinine, Ser: 1.38 mg/dL — ABNORMAL HIGH (ref 0.44–1.00)
Creatinine, Ser: 2.23 mg/dL — ABNORMAL HIGH (ref 0.44–1.00)
GFR, Estimated: 39 mL/min — ABNORMAL LOW (ref >60)
GFR, Estimated: 22 mL/min — ABNORMAL LOW (ref >60)
Glucose, Bld: 95 mg/dL (ref 70–99)
Glucose, Bld: 134 mg/dL — ABNORMAL HIGH (ref 70–99)
Potassium: 2.3 mmol/L — CL (ref 3.5–5.1)
Potassium: 3.1 mmol/L — ABNORMAL LOW (ref 3.5–5.1)
Sodium: 144 mmol/L (ref 135–145)
Sodium: 141 mmol/L (ref 135–145)
Total Bilirubin: 0.7 mg/dL (ref 0.3–1.2)
Total Bilirubin: 1 mg/dL (ref 0.3–1.2)
Total Protein: 3.5 g/dL — ABNORMAL LOW (ref 6.5–8.1)
Total Protein: 5.4 g/dL — ABNORMAL LOW (ref 6.5–8.1)

## 2021-11-29 LAB — LACTIC ACID, PLASMA: Lactic Acid, Venous: 1.3 mmol/L (ref 0.5–1.9)

## 2021-11-29 LAB — PHOSPHORUS: Phosphorus: 2.5 mg/dL (ref 2.5–4.6)

## 2021-11-29 MED ORDER — HEPARIN SODIUM (PORCINE) 5000 UNIT/ML IJ SOLN
5000.0000 [IU] | Freq: Three times a day (TID) | INTRAMUSCULAR | Status: DC
  Administered 2021-11-29 – 2021-12-06 (×20): 5000 [IU] via SUBCUTANEOUS
  Filled 2021-11-29 (×20): qty 1

## 2021-11-29 MED ORDER — ACETAMINOPHEN 325 MG PO TABS
650.0000 mg | ORAL_TABLET | Freq: Four times a day (QID) | ORAL | Status: DC | PRN
  Administered 2021-11-29 – 2021-12-05 (×3): 650 mg via ORAL
  Filled 2021-11-29 (×3): qty 2

## 2021-11-29 MED ORDER — SODIUM CHLORIDE 0.9 % IV SOLN
6.2500 mg | Freq: Once | INTRAVENOUS | Status: AC
  Administered 2021-11-29: 6.25 mg via INTRAVENOUS
  Filled 2021-11-29: qty 0.25

## 2021-11-29 MED ORDER — POTASSIUM CHLORIDE IN NACL 20-0.9 MEQ/L-% IV SOLN
INTRAVENOUS | Status: DC
  Filled 2021-11-29 (×2): qty 1000

## 2021-11-29 MED ORDER — POTASSIUM CHLORIDE IN NACL 20-0.9 MEQ/L-% IV SOLN
INTRAVENOUS | Status: AC
  Filled 2021-11-29: qty 1000

## 2021-11-29 MED ORDER — ONDANSETRON HCL 4 MG/2ML IJ SOLN
4.0000 mg | Freq: Four times a day (QID) | INTRAMUSCULAR | Status: AC | PRN
  Administered 2021-11-30 (×2): 4 mg via INTRAVENOUS
  Filled 2021-11-29 (×2): qty 2

## 2021-11-29 MED ORDER — POTASSIUM CHLORIDE 10 MEQ/100ML IV SOLN
10.0000 meq | INTRAVENOUS | Status: AC
  Administered 2021-11-29 (×4): 10 meq via INTRAVENOUS
  Filled 2021-11-29 (×4): qty 100

## 2021-11-29 MED ORDER — OXYCODONE HCL 5 MG PO TABS
2.5000 mg | ORAL_TABLET | ORAL | Status: DC | PRN
  Administered 2021-11-29: 2.5 mg via ORAL
  Filled 2021-11-29: qty 1

## 2021-11-29 MED ORDER — LACTATED RINGERS IV BOLUS
500.0000 mL | Freq: Once | INTRAVENOUS | Status: AC
  Administered 2021-11-29: 500 mL via INTRAVENOUS

## 2021-11-29 MED ORDER — POTASSIUM CHLORIDE 10 MEQ/100ML IV SOLN
10.0000 meq | INTRAVENOUS | Status: AC
  Administered 2021-11-29 (×3): 10 meq via INTRAVENOUS
  Filled 2021-11-29 (×3): qty 100

## 2021-11-29 NOTE — Plan of Care (Signed)
Confirmed with patient and her brother (who is also her healthcare POA) that she is a DNR. Brother is trying to fax [place] copies of the paperwork.

## 2021-11-29 NOTE — ED Notes (Addendum)
Enema given with poor retention (approximately 5 min) before using bedpan. Small flecks of stool and brown tinged output noted in bedpan.  Pt encouraged to continue drinking the magnesium citrate so that RN can continue with bowel regimen.

## 2021-11-29 NOTE — ED Notes (Signed)
Pt has coffee ground drainage from NG tube.  Total .

## 2021-11-29 NOTE — Progress Notes (Signed)
PHARMACY - TOTAL PARENTERAL NUTRITION CONSULT NOTE   Indication:  prolonged NPO status due to nausea/vomiting in the setting of pneumoperitoneum and constipation   Patient Measurements: Height: _0  (149.9 cm) Weight: 45 kg (99 lb 3.3 oz) IBW/kg (Calculated) : 43.2 TPN AdjBW (KG): 45 Body mass index is 20.04 kg/m. Usual Weight: 48kg, ~ 105/106 lbs per patient   Assessment: Patient with PMH notable for aortic insufficiency, hypertension, HLD, hypothyroidism, Turner syndrome, horseshoe kidney presented with initial concern for a small bowel obstruction. Patient reports no bowel movement in 10 days. Throughout this time she has had poor oral intake saying she only ate 1-2 crackers per day. Prior to this, patient had adequate oral intake stating she ate 3 meals per day. Average meal included protein, carbs, and some fat (for ex: breakfast: 1-2 eggs, sausage, and english muffin). General surgery consulted for pneumoperitoneum seen on CT scan.  Recommending NPO status and medical management for now.   Glucose / Insulin: no history of diabetes noted, last A1C from several years ago 5.5%  Electrolytes: K: 3.1 (L, received 163mq IV in past ~12 hours), Na: 141, Mag: 2.7 (H), CoCa: 10.2, HCO: 27, Cl: 99  Renal: AKI, Scr: 2.23, unclear baseline, Scr on 11/20 was 1.90  Hepatic: AST/ALT WNL, Alk phos: 59, no TG yet, albumin: 2.5  Intake / Output; MIVF: -1564murine output, NS + 2037mKcl @ 41m56m, NG tube placement pending  GI Imaging: 11/20: CT abdomen, small pneumoperitoneum  GI Surgeries / Procedures: none   Central access: no central acess  TPN start date:   Nutritional Goals: Goal TPN rate is 55 mL/hr (provides 54 g of protein (1.2g/kg) and 1350 kcals per day (30kcal/kg)  RD Assessment: pending     Current Nutrition:  NPO TPN   Plan:  Consult d/c after discussion with CCS and Dr. HongAlgis LimingHeatVentura Sellers21/2023,10:55 AM

## 2021-11-29 NOTE — ED Notes (Signed)
Per MD Hongalgi bladder scan can be discontinued as done at 0400 by night shift RN with I&O.  General surgery paged as well per MD request about NG tube and possible TPN orders.  No new orders at this time.

## 2021-11-29 NOTE — Progress Notes (Signed)
Progress Note     Subjective: Very hard of hearing but able to ascertain that her abdominal pain is overall stable. No nausea with NGT in place. Has been passing gas and some stool.   Objective: Vital signs in last 24 hours: Temp:  [97.4 F (36.3 C)-97.9 F (36.6 C)] 97.8 F (36.6 C) (11/21 1114) Pulse Rate:  [35-107] 96 (11/21 1345) Resp:  [14-27] 19 (11/21 1345) BP: (86-125)/(53-94) 106/79 (11/21 1345) SpO2:  [91 %-98 %] 97 % (11/21 1345) Weight:  [45 kg] 45 kg (11/21 0729) Last BM Date : 11/19/21  Intake/Output from previous day: 11/20 0701 - 11/21 0700 In: 2175 [I.V.:300; IV Piggyback:1875] Out: 150 [Urine:150] Intake/Output this shift: No intake/output data recorded.  PE: General: pleasant, WD, female who is laying in bed in NAD Lungs: Respiratory effort nonlabored Abd: moderately distended but soft. Diffusely TTP without peritonitis. Well healed abdominal scar. NGT in place with dried blood at naris and maroon tinged dark output in cannister (600 mL) MSK: all 4 extremities are symmetrical with no cyanosis, clubbing, or edema. Skin: warm and dry with no masses, lesions, or rashes Psych: A&Ox3 with an appropriate affect.    Lab Results:  Recent Labs    12/22/2021 1800 11/29/21 0350  WBC 37.6* 36.0*  HGB 17.3* 15.4*  HCT 50.6* 45.6  PLT 316 285   BMET Recent Labs    11/29/21 0530 11/29/21 0649  NA 144 141  K 2.3* 3.1*  CL 115* 99  CO2 18* 27  GLUCOSE 95 134*  BUN 53* 72*  CREATININE 1.38* 2.23*  CALCIUM 6.1* 9.0   PT/INR No results for input(s): "LABPROT", "INR" in the last 72 hours. CMP     Component Value Date/Time   NA 141 11/29/2021 0649   NA 137 07/07/2015 0000   K 3.1 (L) 11/29/2021 0649   CL 99 11/29/2021 0649   CO2 27 11/29/2021 0649   GLUCOSE 134 (H) 11/29/2021 0649   BUN 72 (H) 11/29/2021 0649   BUN 12 07/07/2015 0000   CREATININE 2.23 (H) 11/29/2021 0649   CREATININE 0.69 12/25/2016 1514   CALCIUM 9.0 11/29/2021 0649   PROT  5.4 (L) 11/29/2021 0649   ALBUMIN 2.5 (L) 11/29/2021 0649   AST 20 11/29/2021 0649   ALT 17 11/29/2021 0649   ALKPHOS 59 11/29/2021 0649   BILITOT 1.0 11/29/2021 0649   GFRNONAA 22 (L) 11/29/2021 0649   GFRNONAA 85 12/25/2016 1514   GFRAA 99 12/25/2016 1514   Lipase     Component Value Date/Time   LIPASE 26 2021-12-22 1800       Studies/Results: CT ABDOMEN PELVIS WO CONTRAST  Result Date: 12/22/21 CLINICAL DATA:  Concern for bowel obstruction. EXAM: CT ABDOMEN AND PELVIS WITHOUT CONTRAST TECHNIQUE: Multidetector CT imaging of the abdomen and pelvis was performed following the standard protocol without IV contrast. RADIATION DOSE REDUCTION: This exam was performed according to the departmental dose-optimization program which includes automated exposure control, adjustment of the mA and/or kV according to patient size and/or use of iterative reconstruction technique. COMPARISON:  CT abdomen pelvis dated 08/10/2020. FINDINGS: Evaluation of this exam is limited in the absence of intravenous contrast. Evaluation is also limited due to streak artifact caused by surgical clips and overlying support wires. Lower chest: The visualized lung bases are clear. There is coronary vascular calcification. There is a small pneumoperitoneum.  No free fluid. Hepatobiliary: The liver is grossly unremarkable. Small layering gallstones. No pericholecystic fluid or evidence of acute cholecystitis. Pancreas: Pancreas  appears atrophic. No active inflammatory changes. Spleen: Irregular splenic contour with areas of capsular calcification, likely sequela of prior insult. 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 There is no hydronephrosis or nephrolithiasis on either side. The urinary bladder is unremarkable. Stomach/Bowel: Large amount of dense stool noted throughout the colon and within the sigmoid. There is sigmoid diverticulosis.  There is diffuse thickening of the wall of the sigmoid colon with diffuse perisigmoid stranding which may be chronic. The possibility of stercoral colitis is not excluded. Clinical correlation is recommended. No evidence of bowel obstruction. The appendix is not visualized with certainty. No inflammatory changes identified in the right lower quadrant. Vascular/Lymphatic: Moderate aortoiliac atherosclerotic disease. The IVC is grossly unremarkable. No obvious portal venous gas. No adenopathy. Reproductive: The uterus is anteverted. Small foci of calcification within the uterus. No adnexal masses. Other: Midline vertical anterior pelvic wall incisional scar. Musculoskeletal: Osteopenia with scoliosis and degenerative changes. Partially visualized median sternotomy. Right S1 Tarlov cyst. No acute osseous pathology. IMPRESSION: 1. Small pneumoperitoneum. In the absence of history of recent surgery, findings concerning for bowel perforation. Surgical consult is advised. 2. Sigmoid diverticulosis. Diffuse thickening of the wall of the sigmoid colon with diffuse perisigmoid stranding. Findings may be chronic, however; the possibility of stercoral colitis is not excluded. No evidence of bowel obstruction. 3. Cholelithiasis. 4. Horseshoe kidney. 5.  Aortic Atherosclerosis (ICD10-I70.0). These results were called by telephone at the time of interpretation on 11/10/2021 at 8:43 pm to provider COOPER ROBBINS , who verbally acknowledged these results. Electronically Signed   By: Anner Crete M.D.   On: 11/11/2021 20:44    Anti-infectives: Anti-infectives (From admission, onward)    Start     Dose/Rate Route Frequency Ordered Stop   11/29/21 0600  piperacillin-tazobactam (ZOSYN) IVPB 2.25 g        2.25 g 100 mL/hr over 30 Minutes Intravenous Every 8 hours 11/14/2021 2342     12/01/2021 2345  piperacillin-tazobactam (ZOSYN) IVPB 3.375 g  Status:  Discontinued        3.375 g 12.5 mL/hr over 240 Minutes Intravenous  Once  11/27/2021 2330 11/29/21 0003   12/05/2021 2115  piperacillin-tazobactam (ZOSYN) IVPB 3.375 g        3.375 g 100 mL/hr over 30 Minutes Intravenous  Once 11/27/2021 2113 12/06/2021 2345        Assessment/Plan Chronic constipation, no BM in 10d  Small volume pneumoperitoneum on CT - ongoing leukocytosis 36.0 (37.6) and electrolyte derangements but and afebrile and abdominal exam reassuring. No urgent/emergent surgery indicated at this time - continue conservative management for now and follow abdominal exam - NGT placed today with over 600 mL out so far. Continue to LIWS. Question if there is some blood in output - maroon colored - continue to monitor. Hgb elevated in the setting of dehydration - having some bowel function with regimen (confirmed with RN) can hold off on TPN for now and reassess tomorrow  FEN: NPO/NGT LIWS ID: [email or web address] VTE: heparin subq  Per primary: Dehydration AKI Anxiety/depression GERD HLD HTN Hypothyroidism IBS Turner syndrome hard of hearing  History of ascending and arch aneurysm with graft 2017 H/o prior abdominal surgery, at least surgery for ovarian cyst but she is unable to remember further surgical history  I reviewed hospitalist notes, last 24 h vitals and pain scores, last 48 h intake and output, last 24 h labs and trends, and last 24 h imaging results.  LOS: 1 day   [facility] 11/29/2021, 2:33 PM Please see Amion for pager number during day hours 7:00am-4:30pm

## 2021-11-29 NOTE — ED Notes (Signed)
Spoke with admitting MD about increased pain and soft BPs (90s/60s). Bolus ordered.

## 2021-11-29 NOTE — Progress Notes (Addendum)
PROGRESS NOTE   Kristi Erickson  JYN:[id number]    DOB: 1940/03/22    DOA: 12/05/2021  PCP: Berdine DanceHeinz, Sara E, PA-C   I have briefly reviewed patients previous medical records in [facility].  Chief Complaint  Patient presents with   Abdominal Pain    Brief Narrative:  81 year old female with PMH of chronic constipation, anxiety, depression, GERD, HLD, HTN, hypothyroidism, IBS, Turner syndrome, hard of hearing presented to the ED on 12/06/2021 with complaints of not having a BM x 10 days, not eating, abdominal discomfort and nausea but no vomiting.  In the ED, leukocytosis of 37.6, CT abdomen showed small pneumoperitoneum concerning for bowel perforation.  General surgery consulted, for now managing conservatively.   Assessment & Plan:  Principal Problem:   Pneumoperitoneum Active Problems:   Essential hypertension   Dyslipidemia   Turner's syndrome   Thyroid nodule   Hypokalemia   Bowel perforation (HCC)   Chronic constipation/pneumoperitoneum concerning for bowel perforation: CT abdomen 11/20: Small pneumoperitoneum concerning for bowel perforation.  Sigmoid diverticulosis.  WBCs 38 K.  General surgery consultation appreciated and recommended conservative management for now including n.p.o., IV fluids, NG tube placement (Unsuccessful bedside attempt by ED RN x 2)-May need CCS to try or IR to place, aggressive bowel regimen, serial abdominal exams/x-rays, Early TPN initiation (per CCS, too early to start now).  They do not think imminent surgery is needed but eventually may need it.  For now patient declines surgical intervention.  Continue IV Zosyn.  Blood cultures x 2: Negative to date.   Addendum: Surgical follow-up appreciated.  NG tube has been placed to low intermittent wall suction, 600 mL output on placement.  Acute kidney injury: Secondary to poor GI intake and ACEI and diuretics.  Presented with creatinine of 1.9 which is increased to 2.23.  As per ED RN, no urinary  retention based on bladder scan of 125 mL last night and did in and out catheterization.  Most recent creatinine was 0.69 but in 2018 and no other results noted.  Continue aggressive IV fluids, strict intake output, avoid nephrotoxins and follow BMP daily.  If creatinine not improving, consider renal ultrasound in a.m. and nephrology consultation.  Hold culprit medications (lisinopril and HCTZ)  Dehydration: IVF. Hemo concentrated by elevated Hb  Hypokalemia: Severe.  Presented with potassium of 2.4.  Continue to replace aggressively IV and follow BMP closely.  Magnesium 2.7.  Leukocytosis:?  Related to pneumoperitoneum.  Did not have leukocytosis chronically as per labs in 2017.  Continue IV Zosyn and follow daily CBCs.  Hypertension: Hold lisinopril and HCTZ.  Initial soft blood pressures which have improved.  Consider starting as needed IV hydralazine if blood pressures increase  Dyslipidemia: Statins on hold while NPO.  Hypothyroidism: Synthroid.  Prolonged QTc: QTc by EKG 11/20: 522 ms but also had BBB morphology and the QTc calculation may not be accurate.  Replace hypokalemia.  Follow BMP in AM.  Minimize QTc prolonging meds.   Body mass index is 20.04 kg/m.    DVT prophylaxis: heparin injection 5,000 Units Start: 11/29/21 1400     Code Status: DNR: Confirmed this with patient's brother, HCPOA this afternoon via phone.  Patient's RN independently verified this as well.  He will be faxing in the POA papers and the DNR papers as well. Family Communication: I discussed in detail with patient's brother via phone, updated care and answered all questions. Disposition:  Status is: Inpatient Remains inpatient appropriate because: N.p.o., NG tube needs,  IV fluids, IV antibiotics     Consultants:   General surgery  Procedures:     Antimicrobials:      Subjective:  "I am worried about my low potassium".  Minimal BM after enema last night.  Abdominal discomfort but no nausea or  vomiting.  No other complaints reported  Objective:   Vitals:   11/29/21 1245 11/29/21 1330 11/29/21 1345 11/29/21 1550  BP: (!) 112/58 108/65 106/79 119/81  Pulse: 99 98 96 (!) 101  Resp: 19 18 19 14   Temp:    97.6 F (36.4 C)  TempSrc:      SpO2: 97% 91% 97% 95%  Weight:      Height:        General exam: Elderly female, small built and frail lying comfortably supine in bed without distress.  Oral mucosa dry. Respiratory system: Clear to auscultation. Respiratory effort normal. Cardiovascular system: S1 & S2 heard, RRR. No JVD, murmurs, rubs, gallops or clicks. No pedal edema.  Telemetry personally reviewed: Sinus rhythm with occasional PVCs LBBB morphology. Gastrointestinal system: Abdomen is mildly distended but soft.  Mild right mid and lower quadrant tenderness without rigidity, guarding or rebound.  No organomegaly or masses appreciated.  Bowel sounds present.  Extensive midline surgical scar extending from suprapubic region to mid sternal scar. Central nervous system: Alert and oriented x 2. No focal neurological deficits.  Hard of hearing Extremities: Symmetric 5 x 5 power. Skin: No rashes, lesions or ulcers Psychiatry: Judgement and insight appear impaired. Mood & affect appropriate.     Data Reviewed:   I have personally reviewed following labs and imaging studies   CBC: Recent Labs  Lab 12/07/2021 1800 11/29/21 0350  WBC 37.6* 36.0*  NEUTROABS  --  30.2*  HGB 17.3* 15.4*  HCT 50.6* 45.6  MCV 90.8 92.1  PLT 316 285    Basic Metabolic Panel: Recent Labs  Lab December 07, 2021 1800 12-07-21 1832 11/29/21 0530 11/29/21 0649  NA 146*  --  144 141  K 2.4*  --  2.3* 3.1*  CL 99  --  115* 99  CO2 31  --  18* 27  GLUCOSE 122*  --  95 134*  BUN 71*  --  53* 72*  CREATININE 1.90*  --  1.38* 2.23*  CALCIUM 9.9  --  6.1* 9.0  MG  --  2.7*  --   --   PHOS  --   --  2.5  --     Liver Function Tests: Recent Labs  Lab December 07, 2021 1800 11/29/21 0530 11/29/21 0649  AST  21 15 20   ALT 22 12 17   ALKPHOS 83 42 59  BILITOT 0.9 0.7 1.0  PROT 6.9 3.5* 5.4*  ALBUMIN 3.6 1.6* 2.5*    CBG: No results for input(s): "GLUCAP" in the last 168 hours.  Microbiology Studies:   Recent Results (from the past 240 hour(s))  Blood culture (routine x 2)     Status: None (Preliminary result)   Collection Time: 07-Dec-2021  8:00 PM   Specimen: BLOOD LEFT HAND  Result Value Ref Range Status   Specimen Description BLOOD LEFT HAND  Final   Special Requests   Final    BOTTLES DRAWN AEROBIC AND ANAEROBIC Blood Culture adequate volume   Culture   Final    NO GROWTH < 12 HOURS Performed at [facility], [address], [place], [facility] [address]    Report Status PENDING  Incomplete  Blood culture (routine x 2)  Status: None (Preliminary result)   Collection Time: 11/22/2021  8:00 PM   Specimen: BLOOD  Result Value Ref Range Status   Specimen Description BLOOD RIGHT ANTECUBITAL  Final   Special Requests   Final    BOTTLES DRAWN AEROBIC AND ANAEROBIC Blood Culture adequate volume   Culture   Final    NO GROWTH < 12 HOURS Performed at [facility], [address] [place], [place] [address]    Report Status PENDING  Incomplete    Radiology Studies:  CT ABDOMEN PELVIS WO CONTRAST  Result Date: 11/23/2021 CLINICAL DATA:  Concern for bowel obstruction. EXAM: CT ABDOMEN AND PELVIS WITHOUT CONTRAST TECHNIQUE: Multidetector CT imaging of the abdomen and pelvis was performed following the standard protocol without IV contrast. RADIATION DOSE REDUCTION: This exam was performed according to the departmental dose-optimization program which includes automated exposure control, adjustment of the mA and/or kV according to patient size and/or use of iterative reconstruction technique. COMPARISON:  CT abdomen pelvis dated 08/10/2020. FINDINGS: Evaluation of this exam is limited in the absence of intravenous contrast. Evaluation is also limited due to streak artifact caused by  surgical clips and overlying support wires. Lower chest: The visualized lung bases are clear. There is coronary vascular calcification. There is a small pneumoperitoneum.  No free fluid. Hepatobiliary: The liver is grossly unremarkable. Small layering gallstones. No pericholecystic fluid or evidence of acute cholecystitis. Pancreas: Pancreas appears atrophic. No active inflammatory changes. Spleen: Irregular splenic contour with areas of capsular calcification, likely sequela of prior insult. 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 There is no hydronephrosis or nephrolithiasis on either side. The urinary bladder is unremarkable. Stomach/Bowel: Large amount of dense stool noted throughout the colon and within the sigmoid. There is sigmoid diverticulosis. There is diffuse thickening of the wall of the sigmoid colon with diffuse perisigmoid stranding which may be chronic. The possibility of stercoral colitis is not excluded. Clinical correlation is recommended. No evidence of bowel obstruction. The appendix is not visualized with certainty. No inflammatory changes identified in the right lower quadrant. Vascular/Lymphatic: Moderate aortoiliac atherosclerotic disease. The IVC is grossly unremarkable. No obvious portal venous gas. No adenopathy. Reproductive: The uterus is anteverted. Small foci of calcification within the uterus. No adnexal masses. Other: Midline vertical anterior pelvic wall incisional scar. Musculoskeletal: Osteopenia with scoliosis and degenerative changes. Partially visualized median sternotomy. Right S1 Tarlov cyst. No acute osseous pathology. IMPRESSION: 1. Small pneumoperitoneum. In the absence of history of recent surgery, findings concerning for bowel perforation. Surgical consult is advised. 2. Sigmoid diverticulosis. Diffuse thickening of the wall of the sigmoid colon with diffuse perisigmoid stranding.  Findings may be chronic, however; the possibility of stercoral colitis is not excluded. No evidence of bowel obstruction. 3. Cholelithiasis. 4. Horseshoe kidney. 5.  Aortic Atherosclerosis (ICD10-I70.0). These results were called by telephone at the time of interpretation on 11/17/2021 at 8:43 pm to provider COOPER ROBBINS , who verbally acknowledged these results. Electronically Signed   By: Elgie Collard M.D.   On: 11/21/2021 20:44    Scheduled Meds:    docusate  200 mg Oral BID   heparin injection (subcutaneous)  5,000 Units Subcutaneous Q8H   polyethylene glycol  17 g Oral BID    Continuous Infusions:    0.9 % NaCl with KCl 20 mEq / L 75 mL/hr at 11/29/21 1208   piperacillin-tazobactam (ZOSYN)  IV Stopped (11/29/21 0729)     LOS: 1 day  Marcellus Scott, MD,  FACP, FHM, [facility], [facility], [facility]   [facility] Elias-Fela Solis     To contact the attending provider between 7A-7P or the covering provider during after hours 7P-7A, please log into the web site [email or web address] and access using universal [place] password for that web site. If you do not have the password, please call the hospital operator.  11/29/2021, 5:26 PM

## 2021-11-29 NOTE — ED Notes (Addendum)
NG tube placement tried by this RN and Bland Span, RN without success.  This RN and Bland Span, RN met with substantial resistance in both nostrils with moderate bleeding.  MD Hongalgi notified and aware.  No new orders at this time.

## 2021-11-30 DIAGNOSIS — E440 Moderate protein-calorie malnutrition: Secondary | ICD-10-CM | POA: Insufficient documentation

## 2021-11-30 DIAGNOSIS — K668 Other specified disorders of peritoneum: Secondary | ICD-10-CM | POA: Diagnosis not present

## 2021-11-30 LAB — CBC
HCT: 45.7 % (ref 36.0–46.0)
Hemoglobin: 15.2 g/dL — ABNORMAL HIGH (ref 12.0–15.0)
MCH: 30.6 pg (ref 26.0–34.0)
MCHC: 33.3 g/dL (ref 30.0–36.0)
MCV: 92 fL (ref 80.0–100.0)
Platelets: 276 10*3/uL (ref 150–400)
RBC: 4.97 MIL/uL (ref 3.87–5.11)
RDW: 12.8 % (ref 11.5–15.5)
WBC: 19.2 10*3/uL — ABNORMAL HIGH (ref 4.0–10.5)
nRBC: 0 % (ref 0.0–0.2)

## 2021-11-30 LAB — BASIC METABOLIC PANEL
Anion gap: 15 (ref 5–15)
BUN: 68 mg/dL — ABNORMAL HIGH (ref 8–23)
CO2: 30 mmol/L (ref 22–32)
Calcium: 8.4 mg/dL — ABNORMAL LOW (ref 8.9–10.3)
Chloride: 97 mmol/L — ABNORMAL LOW (ref 98–111)
Creatinine, Ser: 1.59 mg/dL — ABNORMAL HIGH (ref 0.44–1.00)
GFR, Estimated: 33 mL/min — ABNORMAL LOW (ref >60)
Glucose, Bld: 92 mg/dL (ref 70–99)
Potassium: 4.1 mmol/L (ref 3.5–5.1)
Sodium: 142 mmol/L (ref 135–145)

## 2021-11-30 LAB — GLUCOSE, CAPILLARY
Glucose-Capillary: 94 mg/dL (ref 70–99)
Glucose-Capillary: 108 mg/dL — ABNORMAL HIGH (ref 70–99)

## 2021-11-30 MED ORDER — SODIUM CHLORIDE 0.9 % IV BOLUS
1000.0000 mL | Freq: Once | INTRAVENOUS | Status: AC
  Administered 2021-11-30: 1000 mL via INTRAVENOUS

## 2021-11-30 MED ORDER — POTASSIUM CHLORIDE IN NACL 20-0.9 MEQ/L-% IV SOLN
INTRAVENOUS | Status: DC
  Filled 2021-11-30 (×2): qty 1000

## 2021-11-30 MED ORDER — ORAL CARE MOUTH RINSE: 15.0000 mL | OROMUCOSAL | Status: DC | PRN

## 2021-11-30 NOTE — Care Management Important Message (Signed)
Important Message  Patient Details  Name: Kristi Erickson MRN: [id number] Date of Birth: 29-Mar-1940   Medicare Important Message Given:  Yes     Dorena Bodo 11/30/2021, 3:21 PM

## 2021-11-30 NOTE — Progress Notes (Signed)
PROGRESS NOTE    Kristi Erickson  ALP:[id number] DOB: 1940-12-29 DOA: 11/23/2021 PCP: Juanda Bond    Brief Narrative:  81 year old with history of chronic constipation, anxiety, depression, GERD, hypertension hyperlipidemia and hypothyroidism, Turner syndrome and hard of hearing presented with not having bowel movement for more than 10 days, not eating, abdominal pain distention and discomfort.  In the emergency room white cell count was 37.6.  CT scan abdomen showed a small pneumoperitoneum concerning for bowel perforation.  Admitted with surgical consultation.  Anticipating conservative management.   Assessment & Plan:   Pneumoperitoneum with bowel perforation: CT scan 11/20 with a small pneumoperitoneum, sigmoid diverticulosis.  WBC count 38,000. N.p.o., NG tube for decompression to suction, adequate pain medications, IV antibiotic with Zosyn.  Biochemically improving and WBC count is improving. Surgery continues to follow for any surgical need.  Acute kidney injury: Due to poor oral intake and ACE inhibitors.  Recent known normal creatinine.  Remains on maintenance IV fluids and already normalizing.  Continue to monitor.  Hypokalemia: Severe and persistent.  On IV replacement.  Recheck level tomorrow morning.  Chronic medical issues including essential hypertension, hypothyroidism: Patient is n.p.o. with NG tube on suction.  Continue to hold oral medications until she is able to take by mouth.  Nutrition Status: Nutrition Problem: Moderate Malnutrition Etiology: acute illness Signs/Symptoms: energy intake < or equal to 50% for > or equal to 5 days, moderate fat depletion, moderate muscle depletion   Surgery is anticipating for patient to eat next few days.  We will hold off on restarting TPN now.   DVT prophylaxis: heparin injection 5,000 Units Start: 11/29/21 1400   Code Status: DNR Family Communication: None at the bedside Disposition Plan: Status is:  Inpatient Remains inpatient appropriate because: Active treatment, NG tube     Consultants:  General surgery  Procedures:  None  Antimicrobials:  Zosyn 11/20---   Subjective: Patient was seen and examined.  She tells me she has some distention of her abdomen but no other complaints.  Patient was very appreciative that I updated her about her condition.  She is not sure whether she is passing flatus or any BM.  NG tube with dark biliary drainage.  Objective: Vitals:   11/29/21 2003 11/30/21 0345 11/30/21 0811 11/30/21 1219  BP: 131/79 126/69 126/81 138/76  Pulse: 98 89 91 91  Resp: 16 16 20 16   Temp: 98.1 F (36.7 C) (!) 97.5 F (36.4 C) (!) 97.5 F (36.4 C) 97.9 F (36.6 C)  TempSrc:   Oral Oral  SpO2: 98% 99% 95% 93%  Weight:      Height:        Intake/Output Summary (Last 24 hours) at 11/30/2021 1317 Last data filed at 11/30/2021 0946 Gross per 24 hour  Intake 2483.64 ml  Output 1330 ml  Net 1153.64 ml   Filed Weights   11/29/21 0729  Weight: 45 kg    Examination:  General exam: Appears calm and comfortable  Hard of hearing.  Not in any distress. Respiratory system: Clear to auscultation. Respiratory effort normal. Cardiovascular system: S1 & S2 heard, RRR.  Gastrointestinal system: Soft.  Mildly diffusely distended.  Bowel sound sluggish.   Central nervous system: Alert and oriented. No focal neurological deficits.flat affect.  Extremities: Symmetric 5 x 5 power.  Generalized weakness.   Data Reviewed: I have personally reviewed following labs and imaging studies  CBC: Recent Labs  Lab 11/19/2021 1800 11/29/21 0350 11/30/21 0650  WBC 37.6* 36.0*  19.2*  NEUTROABS  --  30.2*  --   HGB 17.3* 15.4* 15.2*  HCT 50.6* 45.6 45.7  MCV 90.8 92.1 92.0  PLT 316 285 276   Basic Metabolic Panel: Recent Labs  Lab 2021-12-20 1800 12/20/2021 1832 11/29/21 0530 11/29/21 0649 11/29/21 1639 11/30/21 0650  NA 146*  --  144 141 141 142  K 2.4*  --  2.3* 3.1*  3.3* 4.1  CL 99  --  115* 99 99 97*  CO2 31  --  18* 27 29 30   GLUCOSE 122*  --  95 134* 121* 92  BUN 71*  --  53* 72* 69* 68*  CREATININE 1.90*  --  1.38* 2.23* 1.84* 1.59*  CALCIUM 9.9  --  6.1* 9.0 8.9 8.4*  MG  --  2.7*  --   --   --   --   PHOS  --   --  2.5  --   --   --    GFR: Estimated Creatinine Clearance: 19.2 mL/min (A) (by C-G formula based on SCr of 1.59 mg/dL (H)). Liver Function Tests: Recent Labs  Lab 2021-12-20 1800 11/29/21 0530 11/29/21 0649  AST 21 15 20   ALT 22 12 17   ALKPHOS 83 42 59  BILITOT 0.9 0.7 1.0  PROT 6.9 3.5* 5.4*  ALBUMIN 3.6 1.6* 2.5*   Recent Labs  Lab 12-20-21 1800  LIPASE 26   No results for input(s): "AMMONIA" in the last 168 hours. Coagulation Profile: No results for input(s): "INR", "PROTIME" in the last 168 hours. Cardiac Enzymes: No results for input(s): "CKTOTAL", "CKMB", "CKMBINDEX", "TROPONINI" in the last 168 hours. BNP (last 3 results) No results for input(s): "PROBNP" in the last 8760 hours. HbA1C: No results for input(s): "HGBA1C" in the last 72 hours. CBG: Recent Labs  Lab 11/30/21 0600  GLUCAP 94   Lipid Profile: No results for input(s): "CHOL", "HDL", "LDLCALC", "TRIG", "CHOLHDL", "LDLDIRECT" in the last 72 hours. Thyroid Function Tests: No results for input(s): "TSH", "T4TOTAL", "FREET4", "T3FREE", "THYROIDAB" in the last 72 hours. Anemia Panel: No results for input(s): "VITAMINB12", "FOLATE", "FERRITIN", "TIBC", "IRON", "RETICCTPCT" in the last 72 hours. Sepsis Labs: Recent Labs  Lab 12-20-21 2130 11/29/21 1639  LATICACIDVEN 1.9 1.3    Recent Results (from the past 240 hour(s))  Blood culture (routine x 2)     Status: None (Preliminary result)   Collection Time: December 20, 2021  8:00 PM   Specimen: BLOOD LEFT HAND  Result Value Ref Range Status   Specimen Description BLOOD LEFT HAND  Final   Special Requests   Final    BOTTLES DRAWN AEROBIC AND ANAEROBIC Blood Culture adequate volume   Culture   Final     NO GROWTH 2 DAYS Performed at [facility], [address], [place], [facility] [address]    Report Status PENDING  Incomplete  Blood culture (routine x 2)     Status: None (Preliminary result)   Collection Time: 12/20/21  8:00 PM   Specimen: BLOOD  Result Value Ref Range Status   Specimen Description BLOOD RIGHT ANTECUBITAL  Final   Special Requests   Final    BOTTLES DRAWN AEROBIC AND ANAEROBIC Blood Culture adequate volume   Culture   Final    NO GROWTH 2 DAYS Performed at [facility], [address], [place], [facility] [address]    Report Status PENDING  Incomplete         Radiology Studies: CT ABDOMEN PELVIS WO CONTRAST  Result Date: December 04, 2021 CLINICAL DATA:  Concern for bowel obstruction. EXAM: CT ABDOMEN AND PELVIS WITHOUT CONTRAST TECHNIQUE: Multidetector CT imaging of the abdomen and pelvis was performed following the standard protocol without IV contrast. RADIATION DOSE REDUCTION: This exam was performed according to the departmental dose-optimization program which includes automated exposure control, adjustment of the mA and/or kV according to patient size and/or use of iterative reconstruction technique. COMPARISON:  CT abdomen pelvis dated 08/10/2020. FINDINGS: Evaluation of this exam is limited in the absence of intravenous contrast. Evaluation is also limited due to streak artifact caused by surgical clips and overlying support wires. Lower chest: The visualized lung bases are clear. There is coronary vascular calcification. There is a small pneumoperitoneum.  No free fluid. Hepatobiliary: The liver is grossly unremarkable. Small layering gallstones. No pericholecystic fluid or evidence of acute cholecystitis. Pancreas: Pancreas appears atrophic. No active inflammatory changes. Spleen: Irregular splenic contour with areas of capsular calcification, likely sequela of prior insult. Adrenals/Urinary Tract: The adrenal gland are unremarkable.1 horseshoe kidney with  parenchymal fusion of the inferior pole. There is no hydronephrosis or nephrolithiasis on either side. The urinary bladder is unremarkable. Stomach/Bowel: Large amount of dense stool noted throughout the colon and within the sigmoid. There is sigmoid diverticulosis. There is diffuse thickening of the wall of the sigmoid colon with diffuse perisigmoid stranding which may be chronic. The possibility of stercoral colitis is not excluded. Clinical correlation is recommended. No evidence of bowel obstruction. The appendix is not visualized with certainty. No inflammatory changes identified in the right lower quadrant. Vascular/Lymphatic: Moderate aortoiliac atherosclerotic disease. The IVC is grossly unremarkable. No obvious portal venous gas. No adenopathy. Reproductive: The uterus is anteverted. Small foci of calcification within the uterus. No adnexal masses. Other: Midline vertical anterior pelvic wall incisional scar. Musculoskeletal: Osteopenia with scoliosis and degenerative changes. Partially visualized median sternotomy. Right S1 Tarlov cyst. No acute osseous pathology. IMPRESSION: 1. Small pneumoperitoneum. In the absence of history of recent surgery, findings concerning for bowel perforation. Surgical consult is advised. 2. Sigmoid diverticulosis. Diffuse thickening of the wall of the sigmoid colon with diffuse perisigmoid stranding. Findings may be chronic, however; the possibility of stercoral colitis is not excluded. No evidence of bowel obstruction. 3. Cholelithiasis. 4. Horseshoe kidney. 5.  Aortic Atherosclerosis (ICD10-I70.0). These results were called by telephone at the time of interpretation on 12-04-21 at 8:43 pm to provider COOPER ROBBINS , who verbally acknowledged these results. Electronically Signed   By: Elgie Collard M.D.   On: 12-04-21 20:44        Scheduled Meds:  docusate  200 mg Oral BID   heparin injection (subcutaneous)  5,000 Units Subcutaneous Q8H   polyethylene glycol   17 g Oral BID   Continuous Infusions:  0.9 % NaCl with KCl 20 mEq / L 75 mL/hr at 11/30/21 1112   piperacillin-tazobactam (ZOSYN)  IV 2.25 g (11/30/21 0500)     LOS: 2 days    Time spent: 35 minutes    Dorcas Carrow, MD [facility] Pager [phone]

## 2021-11-30 NOTE — TOC Initial Note (Signed)
Transition of Care [facility]) - Initial/Assessment Note    Patient Details  Name: Kristi Erickson MRN: [id number] Date of Birth: 09/08/1940  Transition of Care [facility]) CM/SW Contact:    Curlene Labrum, RN Phone Number: 11/30/2021, 4:46 PM  Clinical Narrative:                 CM met with the patient at the bedside - Admitted for Gastrointestinal perforation and is currently receiving IV antibiotics.  NGT to LWS at this time.  Patient lives alone at the home and the closest family lives one hour away in [place], [place].  DME at the home include RW.  Patient currently drives with no active HH at this time.  CM will continue to follow the patient for TOC needs.  Expected Discharge Plan: [place] Barriers to Discharge: Continued Medical Work up   Patient Goals and CMS Choice Patient states their goals for this hospitalization and ongoing recovery are:: To get better CMS Medicare.gov Compare Post Acute Care list provided to:: Patient Choice offered to / list presented to : Patient  Expected Discharge Plan and Services Expected Discharge Plan: [place]   Discharge Planning Services: CM Consult   Living arrangements for the past 2 months: Single Family Home                                      Prior Living Arrangements/Services Living arrangements for the past 2 months: Single Family Home Lives with:: Self (Patient lives alone and closest family is brother that lives in [place], [place]) Patient language and need for interpreter reviewed:: Yes Do you feel safe going back to the place where you live?: Yes      Need for Family Participation in Patient Care: Yes (Comment) Care giver support system in place?: Yes (comment) Current home services: DME (RW at the home, patient drives) Criminal Activity/Legal Involvement Pertinent to Current Situation/Hospitalization: No - Comment as needed  Activities of Daily Living Home Assistive  Devices/Equipment: Hearing aid, Eyeglasses, Walker (specify type), Cane (specify quad or straight) ADL Screening (condition at time of admission) Patient's cognitive ability adequate to safely complete daily activities?: Yes Is the patient deaf or have difficulty hearing?: Yes Does the patient have difficulty seeing, even when wearing glasses/contacts?: No Does the patient have difficulty concentrating, remembering, or making decisions?: No Patient able to express need for assistance with ADLs?: Yes Does the patient have difficulty dressing or bathing?: No Independently performs ADLs?: Yes (appropriate for developmental age) Does the patient have difficulty walking or climbing stairs?: No Weakness of Legs: None Weakness of Arms/Hands: None  Permission Sought/Granted Permission sought to share information with : Case Manager, Family Supports          Permission granted to share info w Relationship: brother - Glena Pharris - [phone]     Emotional Assessment Appearance:: Appears stated age Attitude/Demeanor/Rapport: Engaged Affect (typically observed): Accepting Orientation: : Oriented to Self, Oriented to Place, Oriented to  Time, Oriented to Situation Alcohol / Substance Use: Not Applicable Psych Involvement: No (comment)  Admission diagnosis:  Pneumoperitoneum [K66.8] Bowel perforation (HCC) [K63.1] Generalized abdominal pain [R10.84] Patient Active Problem List   Diagnosis Date Noted   Malnutrition of moderate degree 11/30/2021   Pneumoperitoneum 11/27/2021   Hypokalemia 11/12/2021   Bowel perforation ([place]) 11/24/2021   History of repair of thoracic aortic aneurysm 10/10/2017  Symptomatic bradycardia 10/10/2017   Bifascicular block 10/10/2017   Osteoporosis 12/25/2016   Preoperative cardiovascular examination    Ascending aortic aneurysm (HCC)    Aneurysm of ascending aorta ([place]) 05/19/2015   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

## 2021-11-30 NOTE — Progress Notes (Addendum)
Attached patient has not voided overnight voiced that she has the urge but unable to do so.  Bladder scan of 243. Doctor on call informed see new order. In and out cath done see flow sheet.

## 2021-11-30 NOTE — Progress Notes (Addendum)
After taking pain tabs with sips of water pt complained dof nausea and vomited. As such, the NGT was unclamped and placed on LIS as per orders. Observation continues.

## 2021-11-30 NOTE — Progress Notes (Signed)
Pt seen resting in bed with NGT clamped and pt tolerating golytle fluid. Instructed by day shift nurse as per doctor to continue with golytle to help her have a bowel movement. However if pt is not able tolerate same should unclamp and place back on LIS.

## 2021-11-30 NOTE — Progress Notes (Signed)
Initial Nutrition Assessment  DOCUMENTATION CODES:   Non-severe (moderate) malnutrition in context of acute illness/injury  INTERVENTION:  - Advance diet as tolerated.  NUTRITION DIAGNOSIS:   Moderate Malnutrition related to acute illness as evidenced by energy intake < or equal to 50% for > or equal to 5 days, moderate fat depletion, moderate muscle depletion.  GOAL:   Patient will meet greater than or equal to 90% of their needs  MONITOR:   Diet advancement  REASON FOR ASSESSMENT:   Malnutrition Screening Tool    ASSESSMENT:   81 y.o. female admits related to constipation. PMH includes: ascending aortic aneurysm, diverticulitis, GERD, glaucoma, hiatal hernia, HLD, HTN, hypokalemia, hypothyroidism, IBS, osteoporosis, Turner's syndrome. Pt is currently receiving medical management for pneumoperitoneum.  Meds include: colace, miralax. Labs reviewed:   Pt is currently NPO with NG tube to suction. Pt sleeping at time of assessment but woke to answer questions. Pt is hard of hearing but able to answer basic questions. Pt reports that she was not eating anything for 1 week PTA. Pt has experienced a slight drop in weight over the past 5 months, but not significant for time frame. However, the pt has visible muscle and fat wasting.  Spoke with MD who states that the pt is having bowel function so no plans for TPN at this time. Recommend supplements once pt diet has advanced. RD will continue to closely monitor for advancement and PO intakes.   NUTRITION - FOCUSED PHYSICAL EXAM:  Flowsheet Row Most Recent Value  Orbital Region Moderate depletion  Upper Arm Region Moderate depletion  Thoracic and Lumbar Region Unable to assess  Buccal Region Moderate depletion  Temple Region Moderate depletion  Clavicle Bone Region Moderate depletion  Clavicle and Acromion Bone Region Moderate depletion  Scapular Bone Region Unable to assess  Dorsal Hand Moderate depletion  Patellar Region  Moderate depletion  Anterior Thigh Region Moderate depletion  Posterior Calf Region Moderate depletion  Edema (RD Assessment) None  Hair Reviewed  Eyes Reviewed  Mouth Reviewed  Skin Reviewed  Nails Reviewed       Diet Order:   Diet Order             Diet NPO time specified Except for: Sips with Meds  Diet effective now                   EDUCATION NEEDS:   Not appropriate for education at this time  Skin:  Skin Assessment: Reviewed RN Assessment  Last BM:  11/27/21  Height:   Ht Readings from Last 1 Encounters:  11/29/21 4\' 11"  (1.499 m)    Weight:   Wt Readings from Last 1 Encounters:  11/29/21 45 kg    Ideal Body Weight:     BMI:  Body mass index is 20.04 kg/m.  Estimated Nutritional Needs:   Kcal:  1350-1575 kcals  Protein:  65-80 gm  Fluid:  >/= 1.3 L  12/01/21, RD, LDN, CNSC.

## 2021-11-30 NOTE — Progress Notes (Signed)
Subjective: CC: Patient vomited last night. NGT back to suction with bilious output. Continued nausea. Some left upper abdominal  pain. She is unsure of any flatus or bm. None documented.   Objective: Vital signs in last 24 hours: Temp:  [97.5 F (36.4 C)-98.1 F (36.7 C)] 97.9 F (36.6 C) (11/22 1219) Pulse Rate:  [89-101] 91 (11/22 1219) Resp:  [14-20] 16 (11/22 1219) BP: (106-138)/(65-81) 138/76 (11/22 1219) SpO2:  [91 %-99 %] 93 % (11/22 1219) Last BM Date : 11/17/21  Intake/Output from previous day: 11/21 0701 - 11/22 0700 In: 2483.6 [P.O.:800; I.V.:1256.9; IV Piggyback:426.8] Out: 1180 [Urine:280; Emesis/NG output:900] Intake/Output this shift: Total I/O In: -  Out: 150 [Emesis/NG output:150]  PE: Gen:  Alert, NAD, pleasant Abd: Moderately distended but soft. Some epigastric and LUQ ttp without rigidity or guarding. Well healed prior midline abdominal scar. NGT in place with bilious output in cannister.   Lab Results:  Recent Labs    11/29/21 0350 11/30/21 0650  WBC 36.0* 19.2*  HGB 15.4* 15.2*  HCT 45.6 45.7  PLT 285 276   BMET Recent Labs    11/29/21 1639 11/30/21 0650  NA 141 142  K 3.3* 4.1  CL 99 97*  CO2 29 30  GLUCOSE 121* 92  BUN 69* 68*  CREATININE 1.84* 1.59*  CALCIUM 8.9 8.4*   PT/INR No results for input(s): "LABPROT", "INR" in the last 72 hours. CMP     Component Value Date/Time   NA 142 11/30/2021 0650   NA 137 07/07/2015 0000   K 4.1 11/30/2021 0650   CL 97 (L) 11/30/2021 0650   CO2 30 11/30/2021 0650   GLUCOSE 92 11/30/2021 0650   BUN 68 (H) 11/30/2021 0650   BUN 12 07/07/2015 0000   CREATININE 1.59 (H) 11/30/2021 0650   CREATININE 0.69 12/25/2016 1514   CALCIUM 8.4 (L) 11/30/2021 0650   PROT 5.4 (L) 11/29/2021 0649   ALBUMIN 2.5 (L) 11/29/2021 0649   AST 20 11/29/2021 0649   ALT 17 11/29/2021 0649   ALKPHOS 59 11/29/2021 0649   BILITOT 1.0 11/29/2021 0649   GFRNONAA 33 (L) 11/30/2021 0650   GFRNONAA 85  12/25/2016 1514   GFRAA 99 12/25/2016 1514   Lipase     Component Value Date/Time   LIPASE 26 11/09/2021 1800    Studies/Results: CT ABDOMEN PELVIS WO CONTRAST  Result Date: 11/25/2021 CLINICAL DATA:  Concern for bowel obstruction. EXAM: CT ABDOMEN AND PELVIS WITHOUT CONTRAST TECHNIQUE: Multidetector CT imaging of the abdomen and pelvis was performed following the standard protocol without IV contrast. RADIATION DOSE REDUCTION: This exam was performed according to the departmental dose-optimization program which includes automated exposure control, adjustment of the mA and/or kV according to patient size and/or use of iterative reconstruction technique. COMPARISON:  CT abdomen pelvis dated 08/10/2020. FINDINGS: Evaluation of this exam is limited in the absence of intravenous contrast. Evaluation is also limited due to streak artifact caused by surgical clips and overlying support wires. Lower chest: The visualized lung bases are clear. There is coronary vascular calcification. There is a small pneumoperitoneum.  No free fluid. Hepatobiliary: The liver is grossly unremarkable. Small layering gallstones. No pericholecystic fluid or evidence of acute cholecystitis. Pancreas: Pancreas appears atrophic. No active inflammatory changes. Spleen: Irregular splenic contour with areas of capsular calcification, likely sequela of prior insult. 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 There is no hydronephrosis or nephrolithiasis on either  side. The urinary bladder is unremarkable. Stomach/Bowel: Large amount of dense stool noted throughout the colon and within the sigmoid. There is sigmoid diverticulosis. There is diffuse thickening of the wall of the sigmoid colon with diffuse perisigmoid stranding which may be chronic. The possibility of stercoral colitis is not excluded. Clinical correlation is recommended. No evidence of bowel  obstruction. The appendix is not visualized with certainty. No inflammatory changes identified in the right lower quadrant. Vascular/Lymphatic: Moderate aortoiliac atherosclerotic disease. The IVC is grossly unremarkable. No obvious portal venous gas. No adenopathy. Reproductive: The uterus is anteverted. Small foci of calcification within the uterus. No adnexal masses. Other: Midline vertical anterior pelvic wall incisional scar. Musculoskeletal: Osteopenia with scoliosis and degenerative changes. Partially visualized median sternotomy. Right S1 Tarlov cyst. No acute osseous pathology. IMPRESSION: 1. Small pneumoperitoneum. In the absence of history of recent surgery, findings concerning for bowel perforation. Surgical consult is advised. 2. Sigmoid diverticulosis. Diffuse thickening of the wall of the sigmoid colon with diffuse perisigmoid stranding. Findings may be chronic, however; the possibility of stercoral colitis is not excluded. No evidence of bowel obstruction. 3. Cholelithiasis. 4. Horseshoe kidney. 5.  Aortic Atherosclerosis (ICD10-I70.0). These results were called by telephone at the time of interpretation on 11/12/2021 at 8:43 pm to provider COOPER ROBBINS , who verbally acknowledged these results. Electronically Signed   By: Anner Crete M.D.   On: 12/05/2021 20:44    Anti-infectives: Anti-infectives (From admission, onward)    Start     Dose/Rate Route Frequency Ordered Stop   11/29/21 0600  piperacillin-tazobactam (ZOSYN) IVPB 2.25 g        2.25 g 100 mL/hr over 30 Minutes Intravenous Every 8 hours 11/23/2021 2342     11/16/2021 2345  piperacillin-tazobactam (ZOSYN) IVPB 3.375 g  Status:  Discontinued        3.375 g 12.5 mL/hr over 240 Minutes Intravenous  Once 11/13/2021 2330 11/29/21 0003   11/29/2021 2115  piperacillin-tazobactam (ZOSYN) IVPB 3.375 g        3.375 g 100 mL/hr over 30 Minutes Intravenous  Once 11/26/2021 2113 11/12/2021 2345        Assessment/Plan Chronic  constipation, no BM in 10d PTA Small volume pneumoperitoneum on CT - Afebrile. Tachycardia resolved, no systolic hypotension, wbc and Cr improving, lactic wnl and abdomen soft without peritonitis. No indication for emergency surgery at this time. Continue conservative management with NPO, NGT, IV abx for now and follow abdominal exam. She has received bowel regimen here. Hopefully she will improve with conservative management.    FEN: NPO/NGT LIWS ID: [email or web address]. WBC 37.6 > 36 > 19.2. Lactic 1.9 > 1.3. Afebrile. HR 90's this am. No hypotension VTE: SCDs, Heparin subq   Per primary: Dehydration AKI Anxiety/depression GERD HLD HTN Hypothyroidism IBS Turner syndrome hard of hearing  History of ascending and arch aneurysm with graft 2017 H/o prior abdominal surgery, at least surgery for ovarian cyst but she is unable to remember further surgical history   LOS: 2 days    Jillyn Ledger , [facility] 11/30/2021, 1:07 PM Please see Amion for pager number during day hours 7:00am-4:30pm

## 2021-12-01 DIAGNOSIS — K668 Other specified disorders of peritoneum: Secondary | ICD-10-CM | POA: Diagnosis not present

## 2021-12-01 LAB — MAGNESIUM: Magnesium: 2.8 mg/dL — ABNORMAL HIGH (ref 1.7–2.4)

## 2021-12-01 LAB — COMPREHENSIVE METABOLIC PANEL
ALT: 17 U/L (ref 0–44)
AST: 17 U/L (ref 15–41)
Albumin: 2.3 g/dL — ABNORMAL LOW (ref 3.5–5.0)
Alkaline Phosphatase: 60 U/L (ref 38–126)
Anion gap: 13 (ref 5–15)
BUN: 59 mg/dL — ABNORMAL HIGH (ref 8–23)
CO2: 24 mmol/L (ref 22–32)
Calcium: 7.9 mg/dL — ABNORMAL LOW (ref 8.9–10.3)
Chloride: 109 mmol/L (ref 98–111)
Creatinine, Ser: 1.24 mg/dL — ABNORMAL HIGH (ref 0.44–1.00)
GFR, Estimated: 44 mL/min — ABNORMAL LOW (ref >60)
Glucose, Bld: 120 mg/dL — ABNORMAL HIGH (ref 70–99)
Potassium: 4.3 mmol/L (ref 3.5–5.1)
Sodium: 146 mmol/L — ABNORMAL HIGH (ref 135–145)
Total Bilirubin: 1 mg/dL (ref 0.3–1.2)
Total Protein: 5.2 g/dL — ABNORMAL LOW (ref 6.5–8.1)

## 2021-12-01 LAB — CBC WITH DIFFERENTIAL/PLATELET
Abs Immature Granulocytes: 0.12 10*3/uL — ABNORMAL HIGH (ref 0.00–0.07)
Basophils Absolute: 0 10*3/uL (ref 0.0–0.1)
Basophils Relative: 0 %
Eosinophils Absolute: 0 10*3/uL (ref 0.0–0.5)
Eosinophils Relative: 0 %
HCT: 45.1 % (ref 36.0–46.0)
Hemoglobin: 14.6 g/dL (ref 12.0–15.0)
Immature Granulocytes: 1 %
Lymphocytes Relative: 1 %
Lymphs Abs: 0.2 10*3/uL — ABNORMAL LOW (ref 0.7–4.0)
MCH: 30.5 pg (ref 26.0–34.0)
MCHC: 32.4 g/dL (ref 30.0–36.0)
MCV: 94.2 fL (ref 80.0–100.0)
Monocytes Absolute: 1.7 10*3/uL — ABNORMAL HIGH (ref 0.1–1.0)
Monocytes Relative: 9 %
Neutro Abs: 17 10*3/uL — ABNORMAL HIGH (ref 1.7–7.7)
Neutrophils Relative %: 89 %
Platelets: UNDETERMINED 10*3/uL (ref 150–400)
RBC: 4.79 MIL/uL (ref 3.87–5.11)
RDW: 13 % (ref 11.5–15.5)
WBC: 19.1 10*3/uL — ABNORMAL HIGH (ref 4.0–10.5)
nRBC: 0 % (ref 0.0–0.2)

## 2021-12-01 LAB — PHOSPHORUS: Phosphorus: 2.2 mg/dL — ABNORMAL LOW (ref 2.5–4.6)

## 2021-12-01 MED ORDER — BISACODYL 10 MG RE SUPP
10.0000 mg | Freq: Once | RECTAL | Status: AC
  Administered 2021-12-01: 10 mg via RECTAL
  Filled 2021-12-01: qty 1

## 2021-12-01 MED ORDER — PANTOPRAZOLE SODIUM 40 MG IV SOLR
40.0000 mg | Freq: Two times a day (BID) | INTRAVENOUS | Status: DC
  Administered 2021-12-01 – 2021-12-09 (×18): 40 mg via INTRAVENOUS
  Filled 2021-12-01 (×19): qty 10

## 2021-12-01 MED ORDER — DEXTROSE 5 % AND 0.45 % NACL IV BOLUS
1000.0000 mL | Freq: Once | INTRAVENOUS | Status: AC
  Administered 2021-12-01: 1000 mL via INTRAVENOUS

## 2021-12-01 MED ORDER — PANTOPRAZOLE SODIUM 40 MG IV SOLR
40.0000 mg | INTRAVENOUS | Status: AC
  Administered 2021-12-01: 40 mg via INTRAVENOUS
  Filled 2021-12-01: qty 10

## 2021-12-01 MED ORDER — DEXTROSE-NACL 5-0.45 % IV SOLN: INTRAVENOUS | Status: DC

## 2021-12-01 NOTE — Progress Notes (Signed)
PROGRESS NOTE    Kristi Erickson  FTD:[id number] DOB: November 17, 1940 DOA: 11/30/2021 PCP: Juanda Bond    Brief Narrative:  81 year old with history of chronic constipation, anxiety, depression, GERD, hypertension hyperlipidemia and hypothyroidism, Turner syndrome and hard of hearing presented with not having bowel movement for more than 10 days, not eating, abdominal pain distention and discomfort.  In the emergency room white cell count was 37.6.  CT scan abdomen showed a small pneumoperitoneum concerning for bowel perforation.  Admitted with surgical consultation.  Anticipating conservative management.   Assessment & Plan:   Pneumoperitoneum with bowel perforation: CT scan 11/20 with a small pneumoperitoneum, sigmoid diverticulosis.  WBC count 38,000. N.p.o., NG tube for decompression to suction, adequate pain medications, IV antibiotic with Zosyn.  Biochemically improving and WBC count is improving. Surgery continues to follow for any surgical need. Patient continues to take MiraLAX and stool softener. Dose of Dulcolax suppository today.  Avoiding enema. If unable to start oral intake, will start on TPN next 24 hours.  Dehydration with hypernatremia and acute kidney injury: Due to poor oral intake and ACE inhibitors.  Still inadequately resuscitated.  Additional 1 L fluid bolus today.  Continue maintenance D5 half-normal saline.  Recheck levels.  Allow ice chips.    Hypokalemia: Adequate.  Acute urinary retention: Needing frequent to straight cath.  Insert Foley catheter.  Chronic medical issues including essential hypertension, hypothyroidism: Patient is n.p.o. with NG tube on suction.  Continue to hold oral medications until she is able to take by mouth.  Blood pressures are stable. We will check TSH level.  Patient has not been taking thyroxine by mouth.  If TSH level significantly elevated, will start on IV thyroxine.  Start mobilizing with PT OT today.  Nutrition  Status: Nutrition Problem: Moderate Malnutrition Etiology: acute illness Signs/Symptoms: energy intake < or equal to 50% for > or equal to 5 days, moderate fat depletion, moderate muscle depletion   Surgery is anticipating for patient to eat next few days.  We will hold off on restarting TPN now.   DVT prophylaxis: heparin injection 5,000 Units Start: 11/29/21 1400   Code Status: DNR Family Communication: Brother on the phone.  Called and updated. Disposition Plan: Status is: Inpatient Remains inpatient appropriate because: Active treatment, NG tube     Consultants:  General surgery  Procedures:  None  Antimicrobials:  Zosyn 11/20---   Subjective: Patient seen and examined.  Appropriately anxious.  Overnight events noted.  She was noted to have more darker colored emesis on her NG tube and was given 1 dose of IV Protonix.  Hemoglobin is stable. Patient had multiple questions.  She repeatedly asked what caused her intestine to break.  She is very aware about avoiding constipation in the future. Denies any nausea vomiting or abdominal pain.  No flatus or bowel movement yet.  Objective: Vitals:   11/30/21 2000 12/01/21 0033 12/01/21 0521 12/01/21 0756  BP:  122/76 121/81 116/63  Pulse:  90 97 94  Resp:  18 18 18   Temp: (!) 97.3 F (36.3 C)  97.8 F (36.6 C) 97.8 F (36.6 C)  TempSrc:   Oral Oral  SpO2:  95% 92% 95%  Weight:      Height:        Intake/Output Summary (Last 24 hours) at 12/01/2021 1133 Last data filed at 12/01/2021 0900 Gross per 24 hour  Intake 1686.12 ml  Output 750 ml  Net 936.12 ml   Filed Weights   11/29/21  0729  Weight: 45 kg    Examination:  General exam: Appears calm and comfortable  Appropriately anxious.  Not in any distress. Hard of hearing.  Frail and debilitated. Respiratory system: Clear to auscultation. Respiratory effort normal. Cardiovascular system: S1 & S2 heard, RRR.  Gastrointestinal system: Soft.  Mildly diffusely  distended.  Bowel sound absent.  Nontender. Central nervous system: Alert and oriented. No focal neurological deficits.flat affect.  Extremities: Symmetric 5 x 5 power.  Generalized weakness.   Data Reviewed: I have personally reviewed following labs and imaging studies  CBC: Recent Labs  Lab 12/04/2021 1800 11/29/21 0350 11/30/21 0650 12/01/21 0907  WBC 37.6* 36.0* 19.2* 19.1*  NEUTROABS  --  30.2*  --  17.0*  HGB 17.3* 15.4* 15.2* 14.6  HCT 50.6* 45.6 45.7 45.1  MCV 90.8 92.1 92.0 94.2  PLT 316 285 276 PLATELET CLUMPS NOTED ON SMEAR, UNABLE TO ESTIMATE   Basic Metabolic Panel: Recent Labs  Lab 12/05/2021 1832 11/29/21 0530 11/29/21 0649 11/29/21 1639 11/30/21 0650 12/01/21 0432  NA  --  144 141 141 142 146*  K  --  2.3* 3.1* 3.3* 4.1 4.3  CL  --  115* 99 99 97* 109  CO2  --  18* 27 29 30 24   GLUCOSE  --  95 134* 121* 92 120*  BUN  --  53* 72* 69* 68* 59*  CREATININE  --  1.38* 2.23* 1.84* 1.59* 1.24*  CALCIUM  --  6.1* 9.0 8.9 8.4* 7.9*  MG 2.7*  --   --   --   --  2.8*  PHOS  --  2.5  --   --   --  2.2*   GFR: Estimated Creatinine Clearance: 24.7 mL/min (A) (by C-G formula based on SCr of 1.24 mg/dL (H)). Liver Function Tests: Recent Labs  Lab 11/20/2021 1800 11/29/21 0530 11/29/21 0649 12/01/21 0432  AST 21 15 20 17   ALT 22 12 17 17   ALKPHOS 83 42 59 60  BILITOT 0.9 0.7 1.0 1.0  PROT 6.9 3.5* 5.4* 5.2*  ALBUMIN 3.6 1.6* 2.5* 2.3*   Recent Labs  Lab 11/21/2021 1800  LIPASE 26   No results for input(s): "AMMONIA" in the last 168 hours. Coagulation Profile: No results for input(s): "INR", "PROTIME" in the last 168 hours. Cardiac Enzymes: No results for input(s): "CKTOTAL", "CKMB", "CKMBINDEX", "TROPONINI" in the last 168 hours. BNP (last 3 results) No results for input(s): "PROBNP" in the last 8760 hours. HbA1C: No results for input(s): "HGBA1C" in the last 72 hours. CBG: Recent Labs  Lab 11/30/21 0600 11/30/21 2031  GLUCAP 94 108*   Lipid  Profile: No results for input(s): "CHOL", "HDL", "LDLCALC", "TRIG", "CHOLHDL", "LDLDIRECT" in the last 72 hours. Thyroid Function Tests: No results for input(s): "TSH", "T4TOTAL", "FREET4", "T3FREE", "THYROIDAB" in the last 72 hours. Anemia Panel: No results for input(s): "VITAMINB12", "FOLATE", "FERRITIN", "TIBC", "IRON", "RETICCTPCT" in the last 72 hours. Sepsis Labs: Recent Labs  Lab 11/19/2021 2130 11/29/21 1639  LATICACIDVEN 1.9 1.3    Recent Results (from the past 240 hour(s))  Blood culture (routine x 2)     Status: None (Preliminary result)   Collection Time: 12/06/2021  8:00 PM   Specimen: BLOOD LEFT HAND  Result Value Ref Range Status   Specimen Description BLOOD LEFT HAND  Final   Special Requests   Final    BOTTLES DRAWN AEROBIC AND ANAEROBIC Blood Culture adequate volume   Culture   Final    NO GROWTH 2  DAYS Performed at [facility], [address] [place], [place] [address]    Report Status PENDING  Incomplete  Blood culture (routine x 2)     Status: None (Preliminary result)   Collection Time: 12/16/21  8:00 PM   Specimen: BLOOD  Result Value Ref Range Status   Specimen Description BLOOD RIGHT ANTECUBITAL  Final   Special Requests   Final    BOTTLES DRAWN AEROBIC AND ANAEROBIC Blood Culture adequate volume   Culture   Final    NO GROWTH 2 DAYS Performed at [facility], [address] [place], [place] [address]    Report Status PENDING  Incomplete         Radiology Studies: No results found.      Scheduled Meds:  bisacodyl  10 mg Rectal Once   docusate  200 mg Oral BID   heparin injection (subcutaneous)  5,000 Units Subcutaneous Q8H   pantoprazole (PROTONIX) IV  40 mg Intravenous Q12H   polyethylene glycol  17 g Oral BID   Continuous Infusions:  dextrose 5 % and 0.45% NaCl     piperacillin-tazobactam (ZOSYN)  IV 2.25 g (12/01/21 0508)     LOS: 3 days    Time spent: 35 minutes    Dorcas Carrow, MD [facility] Pager [phone]

## 2021-12-01 NOTE — Evaluation (Signed)
Physical Therapy Evaluation Patient Details Name: Kristi Erickson MRN: [id number] DOB: 22-Sep-1940 Today's Date: 12/01/2021  History of Present Illness  81 yo female admitted 11/20 with pneumoperitoneum. PMhx; AKI, HLD, HTN, IBS, GERD, HOH, Turner syndrome  Clinical Impression  Pt pleasant with flat affect and reports fatigue. Pt lives alone and reports no family/friends in area to assist. Pt currently significantly limited by fatigue and weakness requiring min assist for all transfers and gait. Pt states no desire for ST-SNF but will need to achieve mod I level to return home. Hopeful pt can improve function for safe return home vs possible need for SNF. Pt with decreased strength, balance, transfers and function who will benefit from acute therapy to maximize mobility and safety.        Recommendations for follow up therapy are one component of a multi-disciplinary discharge planning process, led by the attending physician.  Recommendations may be updated based on patient status, additional functional criteria and insurance authorization.  Follow Up Recommendations Home health PT      Assistance Recommended at Discharge Frequent or constant Supervision/Assistance  Patient can return home with the following  A little help with walking and/or transfers;A lot of help with bathing/dressing/bathroom;Assistance with cooking/housework;Help with stairs or ramp for entrance    Equipment Recommendations BSC/3in1  Recommendations for Other Services  OT consult    Functional Status Assessment Patient has had a recent decline in their functional status and demonstrates the ability to make significant improvements in function in a reasonable and predictable amount of time.     Precautions / Restrictions Precautions Precautions: Fall;Other (comment) Precaution Comments: NGT      Mobility  Bed Mobility Overal bed mobility: Needs Assistance Bed Mobility: Rolling, Sidelying to Sit Rolling:  Supervision Sidelying to sit: Supervision       General bed mobility comments: supervision with cues for sequence and to manage lines, increased time and use of rail with HOB 10 degrees    Transfers Overall transfer level: Needs assistance   Transfers: Sit to/from Stand Sit to Stand: Min guard           General transfer comment: cues for hand placement as pt pulling up on RW despite cues and required hand over hand assist for placement to stand from recliner on 2nd trial    Ambulation/Gait Ambulation/Gait assistance: Min assist Gait Distance (Feet): 20 Feet Assistive device: Rolling walker (2 wheels) Gait Pattern/deviations: Step-through pattern, Decreased stride length, Trunk flexed   Gait velocity interpretation: 1.31 - 2.62 ft/sec, indicative of limited community ambulator   General Gait Details: pt with self too posterior to RW, difficulty directing RW with min assist to control RW and for cues. limited by fatigue  Stairs            Wheelchair Mobility    Modified Rankin (Stroke Patients Only)       Balance Overall balance assessment: Needs assistance   Sitting balance-Leahy Scale: Fair     Standing balance support: Reliant on assistive device for balance, Bilateral upper extremity supported Standing balance-Leahy Scale: Poor Standing balance comment: min assist and RW for balance                             Pertinent Vitals/Pain Pain Assessment Pain Assessment: No/denies pain    Home Living Family/patient expects to be discharged to:: Private residence Living Arrangements: Alone Available Help at Discharge: Family;Available PRN/intermittently Type of Home: House Home Access: Stairs  to enter   Entrance Stairs-Number of Steps: 1   Home Layout: One level Home Equipment: Agricultural consultant (2 wheels)      Prior Function Prior Level of Function : Independent/Modified Independent;Driving                     Hand Dominance         Extremity/Trunk Assessment   Upper Extremity Assessment Upper Extremity Assessment: Generalized weakness    Lower Extremity Assessment Lower Extremity Assessment: Generalized weakness    Cervical / Trunk Assessment Cervical / Trunk Assessment: Kyphotic  Communication   Communication: HOH  Cognition Arousal/Alertness: Awake/alert Behavior During Therapy: Flat affect Overall Cognitive Status: Impaired/Different from baseline Area of Impairment: Safety/judgement                         Safety/Judgement: Decreased awareness of safety, Decreased awareness of deficits              General Comments      Exercises     Assessment/Plan    PT Assessment Patient needs continued PT services  PT Problem List Decreased strength;Decreased mobility;Decreased activity tolerance;Decreased balance;Decreased knowledge of use of DME;Decreased cognition       PT Treatment Interventions Gait training;Therapeutic exercise;Patient/family education;Balance training;Stair training;Functional mobility training;DME instruction;Therapeutic activities    PT Goals (Current goals can be found in the Care Plan section)  Acute Rehab PT Goals Patient Stated Goal: return home PT Goal Formulation: With patient Time For Goal Achievement: 12/15/21 Potential to Achieve Goals: Fair    Frequency Min 3X/week     Co-evaluation               AM-PAC PT "6 Clicks" Mobility  Outcome Measure Help needed turning from your back to your side while in a flat bed without using bedrails?: A Little Help needed moving from lying on your back to sitting on the side of a flat bed without using bedrails?: A Little Help needed moving to and from a bed to a chair (including a wheelchair)?: A Little Help needed standing up from a chair using your arms (e.g., wheelchair or bedside chair)?: A Little Help needed to walk in hospital room?: A Little Help needed climbing 3-5 steps with a railing? :  Total 6 Click Score: 16    End of Session Equipment Utilized During Treatment: Gait belt Activity Tolerance: Patient tolerated treatment well Patient left: in chair;with call bell/phone within reach;with chair alarm set Nurse Communication: Mobility status PT Visit Diagnosis: Other abnormalities of gait and mobility (R26.89);Muscle weakness (generalized) (M62.81)    Time: 5102-5852 PT Time Calculation (min) (ACUTE ONLY): 23 min   Charges:   PT Evaluation $PT Eval Moderate Complexity: 1 Mod PT Treatments $Therapeutic Activity: 8-22 mins        Merryl Hacker, PT Acute Rehabilitation Services Office: [phone]   Enedina Finner Shadonna Benedick 12/01/2021, 12:00 PM

## 2021-12-01 NOTE — Evaluation (Signed)
Occupational Therapy Evaluation Patient Details Name: Kristi Erickson MRN: [id number] DOB: [birth year] Today's Date: 12/01/2021   History of Present Illness 81 yo female admitted 11/20 with pneumoperitoneum. PMhx; AKI, HLD, HTN, IBS, GERD, HOH, Turner syndrome   Clinical Impression   PTA, pt lived alone and reports being independent. Upon eval, pt with decreased arousal due to lethargy and fatigue. Pt requiring min A for transfers and LB ADL and Min guard A for UB ADL due to fatigue and weakness. Pt wishes to return home, but will need to perform at mod I level prior to return home. Pending progress, recommending HHOT, but may need ST-SNF if pt continues to experience weakness.      Recommendations for follow up therapy are one component of a multi-disciplinary discharge planning process, led by the attending physician.  Recommendations may be updated based on patient status, additional functional criteria and insurance authorization.   Follow Up Recommendations  Home health OT     Assistance Recommended at Discharge Frequent or constant Supervision/Assistance  Patient can return home with the following A little help with walking and/or transfers;A little help with bathing/dressing/bathroom;Assistance with cooking/housework;Assist for transportation    Functional Status Assessment  Patient has had a recent decline in their functional status and demonstrates the ability to make significant improvements in function in a reasonable and predictable amount of time.  Equipment Recommendations  BSC/3in1    Recommendations for Other Services       Precautions / Restrictions Precautions Precautions: Fall;Other (comment) Precaution Comments: NGT Restrictions Weight Bearing Restrictions: No      Mobility Bed Mobility Overal bed mobility: Needs Assistance Bed Mobility: Sit to Sidelying, Rolling Rolling: Supervision       Sit to sidelying: Min guard General bed mobility comments:  increased time    Transfers Overall transfer level: Needs assistance Equipment used: Rolling walker (2 wheels) Transfers: Sit to/from Stand Sit to Stand: Min assist           General transfer comment: Cues for hand placement and light boost for rise.      Balance Overall balance assessment: Needs assistance Sitting-balance support: No upper extremity supported, Feet supported Sitting balance-Leahy Scale: Fair Sitting balance - Comments: min guard   Standing balance support: Reliant on assistive device for balance, Bilateral upper extremity supported Standing balance-Leahy Scale: Poor Standing balance comment: min assist and RW for balance, cues for upright posture                           ADL either performed or assessed with clinical judgement   ADL Overall ADL's : Needs assistance/impaired Eating/Feeding: Modified independent;Bed level   Grooming: Sitting;Min guard   Upper Body Bathing: Min guard;Sitting   Lower Body Bathing: Minimal assistance;Sit to/from stand;Min guard   Upper Body Dressing : Min guard;Sitting   Lower Body Dressing: Min guard;Sit to/from stand   Toilet Transfer: Min guard;Stand-pivot;Rolling walker (2 wheels);BSC/3in1;Minimal assistance Toilet Transfer Details (indicate cue type and reason): multimodal cues for transfers due to lethargy and decreased safety awareness.         Functional mobility during ADLs: Minimal assistance;Rolling walker (2 wheels)       Vision Baseline Vision/History: 1 Wears glasses Ability to See in Adequate Light: 0 Adequate Patient Visual Report: No change from baseline Vision Assessment?: No apparent visual deficits Additional Comments: WFL for tasks assessed. Too lethargic to participate in formal testing     Perception Perception Perception Tested?: No  Praxis Praxis Praxis tested?: Not tested    Pertinent Vitals/Pain Pain Assessment Pain Assessment: No/denies pain     Hand Dominance  Right   Extremity/Trunk Assessment Upper Extremity Assessment Upper Extremity Assessment: Generalized weakness   Lower Extremity Assessment Lower Extremity Assessment: Generalized weakness   Cervical / Trunk Assessment Cervical / Trunk Assessment: Kyphotic   Communication Communication Communication: HOH   Cognition Arousal/Alertness: Awake/alert Behavior During Therapy: Flat affect Overall Cognitive Status: Difficult to assess Area of Impairment: Safety/judgement, Following commands                       Following Commands: Follows one step commands inconsistently, Follows one step commands with increased time Safety/Judgement: Decreased awareness of safety, Decreased awareness of deficits     General Comments: Pt lethargic on arrival, and arousing to gentle touch, but remained lethargic throughout session, requiring multimodal cues to follow commands. pt reporting it is day after thanksgiving.     General Comments  VSS, but pt very lethargic    Exercises     Shoulder Instructions      Home Living Family/patient expects to be discharged to:: Private residence Living Arrangements: Alone Available Help at Discharge: Family;Available PRN/intermittently Type of Home: House Home Access: Stairs to enter [employer] of Steps: 1   Home Layout: One level     Bathroom Shower/Tub: Producer, television/film/video: Standard     Home Equipment: Agricultural consultant (2 wheels)          Prior Functioning/Environment Prior Level of Function : Independent/Modified Independent;Driving                        OT Problem List: Decreased strength;Decreased activity tolerance;Impaired balance (sitting and/or standing);Decreased safety awareness;Decreased knowledge of use of DME or AE      OT Treatment/Interventions: Self-care/ADL training;Therapeutic exercise;DME and/or AE instruction;Therapeutic activities;Cognitive  remediation/compensation;Patient/family education;Balance training    OT Goals(Current goals can be found in the care plan section) Acute Rehab OT Goals Patient Stated Goal: sleep OT Goal Formulation: With patient Time For Goal Achievement: 12/15/21 Potential to Achieve Goals: Good  OT Frequency: Min 2X/week    Co-evaluation              AM-PAC OT "6 Clicks" Daily Activity     Outcome Measure Help from another person eating meals?: None Help from another person taking care of personal grooming?: A Little Help from another person toileting, which includes using toliet, bedpan, or urinal?: A Little Help from another person bathing (including washing, rinsing, drying)?: A Little Help from another person to put on and taking off regular upper body clothing?: A Little Help from another person to put on and taking off regular lower body clothing?: A Little 6 Click Score: 19   End of Session Equipment Utilized During Treatment: Gait belt;Rolling walker (2 wheels) Nurse Communication: Mobility status;Other (comment) (lethargy, return to bed, potentially loose stool as indicated by lightly soiled bed pad on rise from chair)  Activity Tolerance: Patient tolerated treatment well Patient left: in bed;with call bell/phone within reach;with bed alarm set  OT Visit Diagnosis: Unsteadiness on feet (R26.81);Muscle weakness (generalized) (M62.81)                Time: 1400-1420 OT Time Calculation (min): 20 min Charges:  OT General Charges $OT Visit: 1 Visit OT Evaluation $OT Eval Moderate Complexity: 1 Mod  Tyler Deis, OTR/L [facility] Acute Rehabilitation Office: [phone]  Myrla Halsted 12/01/2021, 1:54 PM

## 2021-12-01 NOTE — Plan of Care (Addendum)
0700:Pt resting in bed at beginning of shift, pt alert and oriented x2-3, pt forgetful, pt NG tube hooked to low intermittent suction, RN was notified by nightshift pt having dark coffee ground drainage from NG tube, night time provider made aware, pt was started on IV Protonix, RN notified Dr.Ghimire, pt also having issues with urinary retention overnight, orders for foley placed by Dr.Ghimire , foley in place, pt bed alarm in place, and bed in lowest position  Pt up in chair with PT   1400:Per Dr.Ghimire okay to continue with prophylaxis, RN gave SQ heparin, When RN gave pt her SQ heparin, Pt very drowsy and needing lots of stimulation to open eyes and making grunts, RN checked pt vitals, pt vitals stable pt 95% on room air, RN made Dr.Ghimire aware, MD came to bedside to assess pt, pt sleepy but able to arouse and follow MD commands.   OT came to work with pt, pt very drowsy for OT but able to transfer from chair to bed, bed alarm in place, pt received a suppository x1 per order

## 2021-12-01 NOTE — Progress Notes (Signed)
Patient has not voided throughout shift. Bladder scan done, 167 ml. No urge to void. Observation continues will scan again in the next 4hrs.

## 2021-12-01 NOTE — Progress Notes (Addendum)
pt this patient is npo except for meds on NGT. Her output looks more coffee grounds tonight than when I previously have her. her last hb @6am  was elevated at 15. and she is on heparin q8 subq. Hospitalist  on call informed. Plan to hold heparin and start on protonix with CBC in the AM. Observation continues. 

## 2021-12-01 NOTE — Progress Notes (Signed)
Progress Note     Subjective: Pt denies bowel function, none recorded. Denies abdominal pain.   Objective: Vital signs in last 24 hours: Temp:  [97.3 F (36.3 C)-97.9 F (36.6 C)] 97.8 F (36.6 C) (11/23 0756) Pulse Rate:  [83-97] 94 (11/23 0756) Resp:  [16-18] 18 (11/23 0756) BP: (116-177)/(63-86) 116/63 (11/23 0756) SpO2:  [92 %-100 %] 95 % (11/23 0756) Last BM Date : 11/29/21  Intake/Output from previous day: 11/22 0701 - 11/23 0700 In: 1536.1 [I.V.:532.5; IV Piggyback:1003.6] Out: 900 [Urine:450; Emesis/NG output:450] Intake/Output this shift: No intake/output data recorded.  PE: Gen:  Alert, NAD, pleasant Abd: Moderately distended but soft. non ttp. Well healed prior midline abdominal scar. NGT in place with bilious output in cannister.    Lab Results:  Recent Labs    11/29/21 0350 11/30/21 0650  WBC 36.0* 19.2*  HGB 15.4* 15.2*  HCT 45.6 45.7  PLT 285 276   BMET Recent Labs    11/30/21 0650 12/01/21 0432  NA 142 146*  K 4.1 4.3  CL 97* 109  CO2 30 24  GLUCOSE 92 120*  BUN 68* 59*  CREATININE 1.59* 1.24*  CALCIUM 8.4* 7.9*   PT/INR No results for input(s): "LABPROT", "INR" in the last 72 hours. CMP     Component Value Date/Time   NA 146 (H) 12/01/2021 0432   NA 137 07/07/2015 0000   K 4.3 12/01/2021 0432   CL 109 12/01/2021 0432   CO2 24 12/01/2021 0432   GLUCOSE 120 (H) 12/01/2021 0432   BUN 59 (H) 12/01/2021 0432   BUN 12 07/07/2015 0000   CREATININE 1.24 (H) 12/01/2021 0432   CREATININE 0.69 12/25/2016 1514   CALCIUM 7.9 (L) 12/01/2021 0432   PROT 5.2 (L) 12/01/2021 0432   ALBUMIN 2.3 (L) 12/01/2021 0432   AST 17 12/01/2021 0432   ALT 17 12/01/2021 0432   ALKPHOS 60 12/01/2021 0432   BILITOT 1.0 12/01/2021 0432   GFRNONAA 44 (L) 12/01/2021 0432   GFRNONAA 85 12/25/2016 1514   GFRAA 99 12/25/2016 1514   Lipase     Component Value Date/Time   LIPASE 26 12/03/2021 1800       Studies/Results: No results  found.  Anti-infectives: Anti-infectives (From admission, onward)    Start     Dose/Rate Route Frequency Ordered Stop   11/29/21 0600  piperacillin-tazobactam (ZOSYN) IVPB 2.25 g        2.25 g 100 mL/hr over 30 Minutes Intravenous Every 8 hours 11/10/2021 2342     11/24/2021 2345  piperacillin-tazobactam (ZOSYN) IVPB 3.375 g  Status:  Discontinued        3.375 g 12.5 mL/hr over 240 Minutes Intravenous  Once 12/01/2021 2330 11/29/21 0003   11/27/2021 2115  piperacillin-tazobactam (ZOSYN) IVPB 3.375 g        3.375 g 100 mL/hr over 30 Minutes Intravenous  Once 12/02/2021 2113 11/24/2021 2345        Assessment/Plan  Chronic constipation, no BM in 10d PTA Small volume pneumoperitoneum on CT - Afebrile and  abdomen soft without peritonitis. No indication for emergency surgery at this time.  - Continue conservative management with NPO, NGT, IV abx for now and follow abdominal exam. She has received bowel regimen here. Hopefully she will improve with conservative management.  - ok to try suppository today, would not recommend enema in setting of pneumoperitoneum    FEN: NPO/NGT LIWS ID: [email or web address]. WBC 37.6 > 36 > 19.2 (11/22). VTE: SCDs, Heparin subq   Per  primary: Dehydration AKI Anxiety/depression GERD HLD HTN Hypothyroidism IBS Turner syndrome hard of hearing  History of ascending and arch aneurysm with graft 2017 H/o prior abdominal surgery, at least surgery for ovarian cyst but she is unable to remember further surgical history   LOS: 3 days     Juliet Rude, [facility] Surgery 12/01/2021, 9:21 AM Please see Amion for pager number during day hours 7:00am-4:30pm

## 2021-12-02 ENCOUNTER — Inpatient Hospital Stay (HOSPITAL_COMMUNITY): Payer: Medicare Other

## 2021-12-02 ENCOUNTER — Inpatient Hospital Stay: Payer: Self-pay

## 2021-12-02 DIAGNOSIS — K668 Other specified disorders of peritoneum: Secondary | ICD-10-CM | POA: Diagnosis not present

## 2021-12-02 LAB — BASIC METABOLIC PANEL
Anion gap: 11 (ref 5–15)
BUN: 35 mg/dL — ABNORMAL HIGH (ref 8–23)
CO2: 27 mmol/L (ref 22–32)
Calcium: 8.1 mg/dL — ABNORMAL LOW (ref 8.9–10.3)
Chloride: 104 mmol/L (ref 98–111)
Creatinine, Ser: 0.86 mg/dL (ref 0.44–1.00)
GFR, Estimated: >60 mL/min (ref >60)
Glucose, Bld: 149 mg/dL — ABNORMAL HIGH (ref 70–99)
Potassium: 3.3 mmol/L — ABNORMAL LOW (ref 3.5–5.1)
Sodium: 142 mmol/L (ref 135–145)

## 2021-12-02 LAB — CBC WITH DIFFERENTIAL/PLATELET
Abs Immature Granulocytes: 0.09 10*3/uL — ABNORMAL HIGH (ref 0.00–0.07)
Basophils Absolute: 0 10*3/uL (ref 0.0–0.1)
Basophils Relative: 0 %
Eosinophils Absolute: 0 10*3/uL (ref 0.0–0.5)
Eosinophils Relative: 0 %
HCT: 40.9 % (ref 36.0–46.0)
Hemoglobin: 14 g/dL (ref 12.0–15.0)
Immature Granulocytes: 1 %
Lymphocytes Relative: 2 %
Lymphs Abs: 0.3 10*3/uL — ABNORMAL LOW (ref 0.7–4.0)
MCH: 31.6 pg (ref 26.0–34.0)
MCHC: 34.2 g/dL (ref 30.0–36.0)
MCV: 92.3 fL (ref 80.0–100.0)
Monocytes Absolute: 1.5 10*3/uL — ABNORMAL HIGH (ref 0.1–1.0)
Monocytes Relative: 10 %
Neutro Abs: 12.6 10*3/uL — ABNORMAL HIGH (ref 1.7–7.7)
Neutrophils Relative %: 87 %
Platelets: 235 10*3/uL (ref 150–400)
RBC: 4.43 MIL/uL (ref 3.87–5.11)
RDW: 13 % (ref 11.5–15.5)
WBC: 14.5 10*3/uL — ABNORMAL HIGH (ref 4.0–10.5)
nRBC: 0 % (ref 0.0–0.2)

## 2021-12-02 LAB — TSH: TSH: 6.382 u[IU]/mL — ABNORMAL HIGH (ref 0.350–4.500)

## 2021-12-02 LAB — MAGNESIUM: Magnesium: 2.2 mg/dL (ref 1.7–2.4)

## 2021-12-02 MED ORDER — MAGNESIUM HYDROXIDE 400 MG/5ML PO SUSP
30.0000 mL | Freq: Once | ORAL | Status: AC
  Administered 2021-12-02: 30 mL
  Filled 2021-12-02: qty 30

## 2021-12-02 MED ORDER — PROCHLORPERAZINE EDISYLATE 10 MG/2ML IJ SOLN
5.0000 mg | Freq: Four times a day (QID) | INTRAMUSCULAR | Status: DC | PRN
  Administered 2021-12-02: 5 mg via INTRAVENOUS
  Filled 2021-12-02: qty 2

## 2021-12-02 MED ORDER — PIPERACILLIN-TAZOBACTAM 3.375 G IVPB
3.3750 g | Freq: Three times a day (TID) | INTRAVENOUS | Status: DC
  Administered 2021-12-02 – 2021-12-06 (×12): 3.375 g via INTRAVENOUS
  Filled 2021-12-02 (×12): qty 50

## 2021-12-02 MED ORDER — KCL IN DEXTROSE-NACL 40-5-0.45 MEQ/L-%-% IV SOLN
INTRAVENOUS | Status: AC
  Filled 2021-12-02 (×3): qty 1000

## 2021-12-02 NOTE — Progress Notes (Signed)
PROGRESS NOTE    Kristi Erickson  [id number] DOB: May 13, 1940 DOA: 11/17/2021 PCP: Hillary Bow    Brief Narrative:  81 year old with history of chronic constipation, anxiety, depression, GERD, hypertension hyperlipidemia and hypothyroidism, Turner syndrome and hard of hearing presented with not having bowel movement for more than 10 days, not eating, abdominal pain, distention and discomfort.  In the emergency room white cell count was 37.6.  CT scan abdomen showed a small pneumoperitoneum concerning for bowel perforation.  Admitted with surgical consultation.  Anticipating conservative management. Day 4 in the hospital , no bowel functions yet.   Assessment & Plan:   Pneumoperitoneum with bowel perforation Severe constipation. CT scan 11/20 with a small pneumoperitoneum, sigmoid diverticulosis.  WBC count 38,000. N.p.o., NG tube for decompression to suction, adequate pain medications, IV antibiotic with Zosyn.  Biochemically improving and WBC count is improving. Surgery continues to follow for any surgical need. Patient continues to take MiraLAX and stool softener. Dose of Dulcolax suppository, no bowel movement.  Just some distal loose stool and secretions. Repeat KUB, if stable may need more aggressive laxatives. Unable to eat, day 4 today in the hospital.  She has not been able to eat for more than 2 weeks at home.  No bowel function yet. PICC line.  start TPN.  Dehydration with hypernatremia and acute kidney injury: Due to poor oral intake and ACE inhibitors.  Adequately resuscitated today.  Continue on maintenance IV fluids with potassium.  Hypokalemia: Replace with IV fluid.  Acute urinary retention: Needing frequent to straight cath.  Insert Foley catheter.  Urine output is adequate.  Chronic medical issues including essential hypertension, hypothyroidism: Patient is n.p.o. with NG tube on suction.  Continue to hold oral medications until she is able to take by  mouth.  Blood pressures are stable.  We will check TSH level.  Patient has not been taking thyroxine by mouth.  If TSH level significantly elevated, will start on IV thyroxine.  Continue to mobilize with PT/OT  Nutrition Status: Nutrition Problem: Moderate Malnutrition Etiology: acute illness Signs/Symptoms: energy intake < or equal to 50% for > or equal to 5 days, moderate fat depletion, moderate muscle depletion   Start TPN   DVT prophylaxis: heparin injection 5,000 Units Start: 11/29/21 1400   Code Status: DNR Family Communication: Brother on the phone.  Called and updated 11/23 Disposition Plan: Status is: Inpatient Remains inpatient appropriate because: Active treatment, NG tube     Consultants:  General surgery  Procedures:  None  Antimicrobials:  Zosyn 11/20---   Subjective:  Patient seen and examined.  She complains of ongoing abdominal distention.  She is not hungry.  She is worried about she has not eaten for more than 3 weeks now.  No other overnight events.  Remains very weak.  She was tired of sensors continue to beep all night. No bowel movement yet.  Nursing reported a small amount of liquid/mucus after Dulcolax suppository yesterday.  Patient denies passing flatus. will check KUB today.  NG tube 150 mL last 24 hours. Foley catheter urine output 877ml  in the last 24 hours.    Objective: Vitals:   12/01/21 1948 12/02/21 0456 12/02/21 0700 12/02/21 0753  BP: 135/77 (!) 150/84  137/72  Pulse: 92 95 89 92  Resp: 16 18 18 15   Temp: (!) 97.4 F (36.3 C) 98.7 F (37.1 C)    TempSrc: Oral Oral    SpO2: 95% (!) 88% 95% 98%  Weight:  Height:        Intake/Output Summary (Last 24 hours) at 12/02/2021 0802 Last data filed at 12/02/2021 0600 Gross per 24 hour  Intake 601.67 ml  Output 800 ml  Net -198.33 ml   Filed Weights   11/29/21 0729  Weight: 45 kg    Examination:  General exam: Appears calm and comfortable .  Hard of hearing.   Frail and debilitated. Respiratory system: Clear to auscultation. Respiratory effort normal.  On room air. Cardiovascular system: S1 & S2 heard, RRR.  Gastrointestinal system: Soft.  Mildly diffusely distended.  Bowel sounds absent.  Nontender. NGT with minimal dark output. Central nervous system: Alert and oriented. No focal neurological deficits.flat affect.  Generalized weakness. Extremities: Symmetric 5 x 5 power.  Generalized weakness.   Data Reviewed: I have personally reviewed following labs and imaging studies  CBC: Recent Labs  Lab 12/08/2021 1800 11/29/21 0350 11/30/21 0650 12/01/21 0907 12/02/21 0257  WBC 37.6* 36.0* 19.2* 19.1* 14.5*  NEUTROABS  --  30.2*  --  17.0* 12.6*  HGB 17.3* 15.4* 15.2* 14.6 14.0  HCT 50.6* 45.6 45.7 45.1 40.9  MCV 90.8 92.1 92.0 94.2 92.3  PLT 316 285 276 PLATELET CLUMPS NOTED ON SMEAR, UNABLE TO ESTIMATE [id number]   Basic Metabolic Panel: Recent Labs  Lab 11/22/2021 1832 11/29/21 0530 11/29/21 0649 11/29/21 1639 11/30/21 0650 12/01/21 0432 12/02/21 0636  NA  --  144 141 141 142 146* 142  K  --  2.3* 3.1* 3.3* 4.1 4.3 3.3*  CL  --  115* 99 99 97* 109 104  CO2  --  18* 27 29 30 24 27   GLUCOSE  --  95 134* 121* 92 120* 149*  BUN  --  53* 72* 69* 68* 59* 35*  CREATININE  --  1.38* 2.23* 1.84* 1.59* 1.24* 0.86  CALCIUM  --  6.1* 9.0 8.9 8.4* 7.9* 8.1*  MG 2.7*  --   --   --   --  2.8* 2.2  PHOS  --  2.5  --   --   --  2.2*  --    GFR: Estimated Creatinine Clearance: 35.6 mL/min (by C-G formula based on SCr of 0.86 mg/dL). Liver Function Tests: Recent Labs  Lab 11/18/2021 1800 11/29/21 0530 11/29/21 0649 12/01/21 0432  AST 21 15 20 17   ALT 22 12 17 17   ALKPHOS 83 42 59 60  BILITOT 0.9 0.7 1.0 1.0  PROT 6.9 3.5* 5.4* 5.2*  ALBUMIN 3.6 1.6* 2.5* 2.3*   Recent Labs  Lab 12/05/2021 1800  LIPASE 26   No results for input(s): "AMMONIA" in the last 168 hours. Coagulation Profile: No results for input(s): "INR", "PROTIME" in the last  168 hours. Cardiac Enzymes: No results for input(s): "CKTOTAL", "CKMB", "CKMBINDEX", "TROPONINI" in the last 168 hours. BNP (last 3 results) No results for input(s): "PROBNP" in the last 8760 hours. HbA1C: No results for input(s): "HGBA1C" in the last 72 hours. CBG: Recent Labs  Lab 11/30/21 0600 11/30/21 2031  GLUCAP 94 108*   Lipid Profile: No results for input(s): "CHOL", "HDL", "LDLCALC", "TRIG", "CHOLHDL", "LDLDIRECT" in the last 72 hours. Thyroid Function Tests: No results for input(s): "TSH", "T4TOTAL", "FREET4", "T3FREE", "THYROIDAB" in the last 72 hours. Anemia Panel: No results for input(s): "VITAMINB12", "FOLATE", "FERRITIN", "TIBC", "IRON", "RETICCTPCT" in the last 72 hours. Sepsis Labs: Recent Labs  Lab 11/18/2021 2130 11/29/21 1639  LATICACIDVEN 1.9 1.3    Recent Results (from the past 240 hour(s))  Blood culture (routine  x 2)     Status: None (Preliminary result)   Collection Time: 2021-12-14  8:00 PM   Specimen: BLOOD LEFT HAND  Result Value Ref Range Status   Specimen Description BLOOD LEFT HAND  Final   Special Requests   Final    BOTTLES DRAWN AEROBIC AND ANAEROBIC Blood Culture adequate volume   Culture   Final    NO GROWTH 4 DAYS Performed at [facility], [address] [place], [place] [address]    Report Status PENDING  Incomplete  Blood culture (routine x 2)     Status: None (Preliminary result)   Collection Time: December 14, 2021  8:00 PM   Specimen: BLOOD  Result Value Ref Range Status   Specimen Description BLOOD RIGHT ANTECUBITAL  Final   Special Requests   Final    BOTTLES DRAWN AEROBIC AND ANAEROBIC Blood Culture adequate volume   Culture   Final    NO GROWTH 4 DAYS Performed at [facility], [address], [place], [place] [address]    Report Status PENDING  Incomplete         Radiology Studies: [place] EKG SITE RITE  Result Date: 12/02/2021 If Site Rite image not attached, placement could not be confirmed due to current  cardiac rhythm.       Scheduled Meds:  docusate  200 mg Oral BID   heparin injection (subcutaneous)  5,000 Units Subcutaneous Q8H   pantoprazole (PROTONIX) IV  40 mg Intravenous Q12H   polyethylene glycol  17 g Oral BID   Continuous Infusions:  dextrose 5 % and 0.45 % NaCl with KCl 40 mEq/L     piperacillin-tazobactam (ZOSYN)  IV 2.25 g (12/02/21 0535)     LOS: 4 days    Time spent: 35 minutes    Dorcas Carrow, MD [facility] Pager [phone]

## 2021-12-02 NOTE — Progress Notes (Signed)
CSW spoke with Tresa Endo at [facility] who states the agency cannot accept the patient due to insurance.  CSW spoke with [place] at [facility] who states the agency cannot accept the patient due to insurance.  Edwin Dada, MSW, LCSW Transitions of Care  Clinical Social Worker II [phone]

## 2021-12-02 NOTE — Progress Notes (Signed)
PHARMACY - TOTAL PARENTERAL NUTRITION CONSULT NOTE   Indication:  pneumoperitoneum, prolonged NPO status  Patient Measurements: Height: 4\' 11"  (149.9 cm) Weight: 45 kg (99 lb 3.3 oz) IBW/kg (Calculated) : 43.2 TPN AdjBW (KG): 45 Body mass index is 20.04 kg/m.  Assessment: 31 yof presenting with chronic constipation (no BM in >10 days PTA), no PO intake, abdominal pain, distension, and discomfort. CT with small pneumoperitoneum concerning for bowel perforation. Surgery planning medical management for now with NPO, NGT, aggressive bowel regimen, and IV antibiotics. Pharmacy consulted to initiate TPN.  Glucose / Insulin: No hx DM (last A1c 5.5% on 06/24/15). CBGs controlled on no meds prior to TPN start Electrolytes: K down to 3.3 (MD repleting in MIVF - will provide 06/26/15 over 24hrs if kept at same rate), Phos 2.2 (last lab 11/23 - no repletion ordered, will recheck in AM), others WNL Renal: AKI resolving - SCr down to 0.86, BUN down to 35 Hepatic: LFTs / Tbili WNL. No TG available yet. Albumin 2.3 Intake / Output; MIVF: NGT output 150 ml; MIVF: D51/2NS+40K at 100 ml/hr; LBM recorded 11/23 inpatient GI Imaging: GI Surgeries / Procedures:   Central access: PICC planned 11/24 TPN start date: 11/25  RD Assessment: Estimated Needs Total Energy Estimated Needs: 1350-1575 kcals Total Protein Estimated Needs: 65-80 gm Total Fluid Estimated Needs: >/= 1.3 L  Current Nutrition:  NPO  Plan:  Pharmacy consult for TPN placed after order entry deadline for today. TPN to start 11/25. Dietitian discussed with MD. TPN labs and nursing orders placed   12/25, PharmD, BCPS Please check AMION for all [facility] contact numbers Clinical Pharmacist 12/02/2021 3:30 PM

## 2021-12-02 NOTE — Progress Notes (Signed)
Progress Note     Subjective: Very small BM with suppository. No flatus. Film this AM with significant stool noted still. RN repositioning NGT. Patient reports distention and nausea.   Objective: Vital signs in last 24 hours: Temp:  [97.4 F (36.3 C)-98.7 F (37.1 C)] 98.7 F (37.1 C) (11/24 0456) Pulse Rate:  [89-95] 92 (11/24 0753) Resp:  [15-18] 15 (11/24 0753) BP: (135-155)/(70-84) 137/72 (11/24 0753) SpO2:  [88 %-98 %] 98 % (11/24 0753) Last BM Date : 12/01/21  Intake/Output from previous day: 11/23 0701 - 11/24 0700 In: 601.7 [I.V.:451.7; NG/GT:150] Out: 800 [Urine:800] Intake/Output this shift: No intake/output data recorded.  PE: Gen:  Alert, NAD, pleasant Abd: Moderately distended but soft. non ttp. Well healed prior midline abdominal scar. NGT in place with bilious output in cannister.    Lab Results:  Recent Labs    12/01/21 0907 12/02/21 0257  WBC 19.1* 14.5*  HGB 14.6 14.0  HCT 45.1 40.9  PLT PLATELET CLUMPS NOTED ON SMEAR, UNABLE TO ESTIMATE 235    BMET Recent Labs    12/01/21 0432 12/02/21 0636  NA 146* 142  K 4.3 3.3*  CL 109 104  CO2 24 27  GLUCOSE 120* 149*  BUN 59* 35*  CREATININE 1.24* 0.86  CALCIUM 7.9* 8.1*    PT/INR No results for input(s): "LABPROT", "INR" in the last 72 hours. CMP     Component Value Date/Time   NA 142 12/02/2021 0636   NA 137 07/07/2015 0000   K 3.3 (L) 12/02/2021 0636   CL 104 12/02/2021 0636   CO2 27 12/02/2021 0636   GLUCOSE 149 (H) 12/02/2021 0636   BUN 35 (H) 12/02/2021 0636   BUN 12 07/07/2015 0000   CREATININE 0.86 12/02/2021 0636   CREATININE 0.69 12/25/2016 1514   CALCIUM 8.1 (L) 12/02/2021 0636   PROT 5.2 (L) 12/01/2021 0432   ALBUMIN 2.3 (L) 12/01/2021 0432   AST 17 12/01/2021 0432   ALT 17 12/01/2021 0432   ALKPHOS 60 12/01/2021 0432   BILITOT 1.0 12/01/2021 0432   GFRNONAA >60 12/02/2021 0636   GFRNONAA 85 12/25/2016 1514   GFRAA 99 12/25/2016 1514   Lipase     Component  Value Date/Time   LIPASE 26 11/23/2021 1800       Studies/Results: DG Abd 1 View  Result Date: 12/02/2021 CLINICAL DATA:  Nasogastric tube placement. EXAM: ABDOMEN - 1 VIEW COMPARISON:  Same day. FINDINGS: Nasogastric tube tip is seen in expected position of proximal stomach. Stable small bowel dilatation is noted concerning for ileus or partial small bowel obstruction. Stool is noted in nondilated colon. IMPRESSION: Nasogastric tube tip is seen in expected position of proximal stomach. Stable small bowel dilatation as described above. Electronically Signed   By: Marijo Conception M.D.   On: 12/02/2021 10:06   DG Abd 1 View  Result Date: 12/02/2021 CLINICAL DATA:  Abdominal distension EXAM: ABDOMEN - 1 VIEW COMPARISON:  11/25/2021 FINDINGS: Small amount retained contrast in RIGHT colon. Dilated small bowel loops increased since previous exam question small bowel obstruction. No bowel wall thickening. Degenerative changes lumbar spine. Nasogastric tube coiled in distal esophagus. Surgical clips mid abdomen. IMPRESSION: Increased small bowel distension question small bowel obstruction. Nasogastric tube coiled in distal esophagus, recommend removal and replacement. Findings called to [facility] on 2W on 12/02/2021 at 0853 hours. Electronically Signed   By: Lavonia Dana M.D.   On: 12/02/2021 08:54   [place] EKG SITE RITE  Result Date: 12/02/2021  If [employer] not attached, placement could not be confirmed due to current cardiac rhythm.   Anti-infectives: Anti-infectives (From admission, onward)    Start     Dose/Rate Route Frequency Ordered Stop   11/29/21 0600  piperacillin-tazobactam (ZOSYN) IVPB 2.25 g        2.25 g 100 mL/hr over 30 Minutes Intravenous Every 8 hours 11/20/2021 2342     12/03/2021 2345  piperacillin-tazobactam (ZOSYN) IVPB 3.375 g  Status:  Discontinued        3.375 g 12.5 mL/hr over 240 Minutes Intravenous  Once 11/25/2021 2330 11/29/21 0003   12/03/2021 2115   piperacillin-tazobactam (ZOSYN) IVPB 3.375 g        3.375 g 100 mL/hr over 30 Minutes Intravenous  Once 11/19/2021 2113 11/27/2021 2345        Assessment/Plan  Chronic constipation, no BM in 10d PTA Small volume pneumoperitoneum on CT - Afebrile and  abdomen soft without peritonitis. No indication for emergency surgery at this time.  - Continue conservative management with NPO, NGT, IV abx for now and follow abdominal exam. She has received bowel regimen here. Hopefully she will improve with conservative management.  - will discuss with MD - once NGT repositioned, may consider SBO protocol with gastrografin and see if that helps clear constipation    FEN: NPO/NGT LIWS ID: [email or web address]. WBC 37.6 > 36 > 19.2 (11/22). VTE: SCDs, Heparin subq   Per primary: Dehydration AKI Anxiety/depression GERD HLD HTN Hypothyroidism IBS Turner syndrome hard of hearing  History of ascending and arch aneurysm with graft 2017 H/o prior abdominal surgery, at least surgery for ovarian cyst but she is unable to remember further surgical history   LOS: 4 days     Kristi Erickson, [facility] 12/02/2021, 10:35 AM Please see Amion for pager number during day hours 7:00am-4:30pm

## 2021-12-02 NOTE — Progress Notes (Signed)
Nutrition Follow-up  DOCUMENTATION CODES:   Non-severe (moderate) malnutrition in context of acute illness/injury  INTERVENTION:   TPN management per pharmacy Monitor magnesium, potassium, and phosphorus BID for at least 3 days, MD to replete as needed, as pt is at risk for refeeding syndrome given malnutrition and NPO status.   NUTRITION DIAGNOSIS:   Moderate Malnutrition related to acute illness as evidenced by energy intake < or equal to 50% for > or equal to 5 days, moderate fat depletion, moderate muscle depletion. - Ongoing  GOAL:   Patient will meet greater than or equal to 90% of their needs - Ongoing  MONITOR:   Diet advancement, Labs, I & O's, Weight trends  REASON FOR ASSESSMENT:   Consult New TPN/TNA  ASSESSMENT:   81 y.o. female admits related to constipation. PMH includes: ascending aortic aneurysm, diverticulitis, GERD, glaucoma, hiatal hernia, HLD, HTN, hypokalemia, hypothyroidism, IBS, osteoporosis, Turner's syndrome. Pt is currently receiving medical management for pneumoperitoneum.  11/21 - NGT placed 11/24 - NGT replaced; plan to start TPN  RD received a consult for new TPN. RD reached out to pharmacy, pharmacy never received a consult for new TPN. Plan to start tomorrow.  MD aware of TPN starting tomorrow.   NG tube to LIWS. No output from NG tube recorded over the past 24 hours. RN reports about 300 mL of green/bilious output currently from NG tube after tube was repositioned. Discussed starting TPN with RN.   Medications reviewed and include: Colace, Protonix, Miralax, IV antibiotics  Labs reviewed: Potassium 3.3, Phosphorus 2.2   Diet Order:   Diet Order             Diet NPO time specified Except for: Sips with Meds  Diet effective now                   EDUCATION NEEDS:   No education needs have been identified at this time  Skin:  Skin Assessment: Reviewed RN Assessment  Last BM:  11/23 - type 7  Height:   Ht Readings from  Last 1 Encounters:  11/29/21 4\' 11"  (1.499 m)    Weight:   Wt Readings from Last 1 Encounters:  11/29/21 45 kg   BMI:  Body mass index is 20.04 kg/m.  Estimated Nutritional Needs:   Kcal:  1350-1575 kcals  Protein:  65-80 gm  Fluid:  >/= 1.3 L    12/01/21 RD, LDN Clinical Dietitian See [facility] for contact information.

## 2021-12-02 NOTE — Progress Notes (Signed)
Mobility Specialist Progress Note:   12/02/21 3212  Mobility  Activity Contraindicated/medical hold   Pt inappropriate for mobility specialist at this time given level of complexity, physical assist, and/or precautions as advised by RN team. Mobility specialist to hold today, will continue to follow for readiness.   Daine Gravel Mobility Specialist Please contact via SecureChat or  Rehab office at [phone]

## 2021-12-03 ENCOUNTER — Inpatient Hospital Stay: Payer: Self-pay

## 2021-12-03 DIAGNOSIS — K668 Other specified disorders of peritoneum: Secondary | ICD-10-CM | POA: Diagnosis not present

## 2021-12-03 LAB — COMPREHENSIVE METABOLIC PANEL
ALT: 26 U/L (ref 0–44)
AST: 35 U/L (ref 15–41)
Albumin: 2 g/dL — ABNORMAL LOW (ref 3.5–5.0)
Alkaline Phosphatase: 70 U/L (ref 38–126)
Anion gap: 8 (ref 5–15)
BUN: 26 mg/dL — ABNORMAL HIGH (ref 8–23)
CO2: 28 mmol/L (ref 22–32)
Calcium: 8.7 mg/dL — ABNORMAL LOW (ref 8.9–10.3)
Chloride: 107 mmol/L (ref 98–111)
Creatinine, Ser: 0.9 mg/dL (ref 0.44–1.00)
GFR, Estimated: >60 mL/min (ref >60)
Glucose, Bld: 122 mg/dL — ABNORMAL HIGH (ref 70–99)
Potassium: 3.7 mmol/L (ref 3.5–5.1)
Sodium: 143 mmol/L (ref 135–145)
Total Bilirubin: 0.5 mg/dL (ref 0.3–1.2)
Total Protein: 4.8 g/dL — ABNORMAL LOW (ref 6.5–8.1)

## 2021-12-03 LAB — CBC WITH DIFFERENTIAL/PLATELET
Abs Immature Granulocytes: 0.16 10*3/uL — ABNORMAL HIGH (ref 0.00–0.07)
Basophils Absolute: 0 10*3/uL (ref 0.0–0.1)
Basophils Relative: 0 %
Eosinophils Absolute: 0.1 10*3/uL (ref 0.0–0.5)
Eosinophils Relative: 0 %
HCT: 40.2 % (ref 36.0–46.0)
Hemoglobin: 13.7 g/dL (ref 12.0–15.0)
Immature Granulocytes: 1 %
Lymphocytes Relative: 3 %
Lymphs Abs: 0.4 10*3/uL — ABNORMAL LOW (ref 0.7–4.0)
MCH: 31.2 pg (ref 26.0–34.0)
MCHC: 34.1 g/dL (ref 30.0–36.0)
MCV: 91.6 fL (ref 80.0–100.0)
Monocytes Absolute: 1.5 10*3/uL — ABNORMAL HIGH (ref 0.1–1.0)
Monocytes Relative: 11 %
Neutro Abs: 11.1 10*3/uL — ABNORMAL HIGH (ref 1.7–7.7)
Neutrophils Relative %: 85 %
Platelets: 223 10*3/uL (ref 150–400)
RBC: 4.39 MIL/uL (ref 3.87–5.11)
RDW: 12.7 % (ref 11.5–15.5)
WBC: 13.2 10*3/uL — ABNORMAL HIGH (ref 4.0–10.5)
nRBC: 0 % (ref 0.0–0.2)

## 2021-12-03 LAB — CULTURE, BLOOD (ROUTINE X 2)
Culture: NO GROWTH
Culture: NO GROWTH
Special Requests: ADEQUATE
Special Requests: ADEQUATE

## 2021-12-03 LAB — MAGNESIUM: Magnesium: 2.2 mg/dL (ref 1.7–2.4)

## 2021-12-03 LAB — GLUCOSE, CAPILLARY
Glucose-Capillary: 116 mg/dL — ABNORMAL HIGH (ref 70–99)
Glucose-Capillary: 120 mg/dL — ABNORMAL HIGH (ref 70–99)
Glucose-Capillary: 137 mg/dL — ABNORMAL HIGH (ref 70–99)

## 2021-12-03 LAB — PHOSPHORUS
Phosphorus: <1 mg/dL — CL (ref 2.5–4.6)
Phosphorus: 2.4 mg/dL — ABNORMAL LOW (ref 2.5–4.6)

## 2021-12-03 LAB — TRIGLYCERIDES: Triglycerides: 159 mg/dL — ABNORMAL HIGH (ref <150)

## 2021-12-03 MED ORDER — TRAVASOL 10 % IV SOLN
INTRAVENOUS | Status: AC
  Filled 2021-12-03: qty 297

## 2021-12-03 MED ORDER — SODIUM CHLORIDE 0.9% FLUSH: 10.0000 mL | INTRAVENOUS | Status: DC | PRN

## 2021-12-03 MED ORDER — INSULIN ASPART 100 UNIT/ML IJ SOLN
0.0000 [IU] | INTRAMUSCULAR | Status: DC
  Administered 2021-12-03: 1 [IU] via SUBCUTANEOUS
  Administered 2021-12-04: 2 [IU] via SUBCUTANEOUS
  Administered 2021-12-04: 1 [IU] via SUBCUTANEOUS
  Administered 2021-12-04: 2 [IU] via SUBCUTANEOUS
  Administered 2021-12-04: 1 [IU] via SUBCUTANEOUS
  Administered 2021-12-05 – 2021-12-06 (×11): 2 [IU] via SUBCUTANEOUS
  Administered 2021-12-07: 1 [IU] via SUBCUTANEOUS
  Administered 2021-12-07 (×2): 2 [IU] via SUBCUTANEOUS
  Administered 2021-12-07: 1 [IU] via SUBCUTANEOUS
  Administered 2021-12-07 (×2): 3 [IU] via SUBCUTANEOUS
  Administered 2021-12-08 – 2021-12-09 (×4): 1 [IU] via SUBCUTANEOUS

## 2021-12-03 MED ORDER — SODIUM PHOSPHATES 45 MMOLE/15ML IV SOLN
30.0000 mmol | Freq: Two times a day (BID) | INTRAVENOUS | Status: AC
  Administered 2021-12-03 (×2): 30 mmol via INTRAVENOUS
  Filled 2021-12-03 (×2): qty 10

## 2021-12-03 MED ORDER — KCL IN DEXTROSE-NACL 40-5-0.45 MEQ/L-%-% IV SOLN
INTRAVENOUS | Status: DC
  Filled 2021-12-03: qty 1000

## 2021-12-03 MED ORDER — CHLORHEXIDINE GLUCONATE CLOTH 2 % EX PADS
6.0000 | MEDICATED_PAD | Freq: Every day | CUTANEOUS | Status: DC
  Administered 2021-12-04 – 2021-12-11 (×8): 6 via TOPICAL

## 2021-12-03 MED ORDER — SODIUM CHLORIDE 0.9% FLUSH
10.0000 mL | Freq: Two times a day (BID) | INTRAVENOUS | Status: DC
  Administered 2021-12-03 – 2021-12-10 (×14): 10 mL
  Administered 2021-12-10: 20 mL
  Administered 2021-12-11: 10 mL

## 2021-12-03 NOTE — Progress Notes (Addendum)
At bedside to place PICC. Pt refusing to have PICC placed.  Alert and oriented.  Attempted to explain to the pt reason for needing the PICC for IV nutrition. Pt adamantly states "No thank you .  I told you I am not doing it".  Pt refuses to sign consent. RN notified. Secure chat sent to Dr Jerral Ralph.  Please reorder when pt cooperative.

## 2021-12-03 NOTE — Progress Notes (Signed)
PROGRESS NOTE    EBBIE SORENSON  TOI:[id number] DOB: [birth year] DOA: Dec 07, 2021 PCP: Juanda Bond    Brief Narrative:  81 year old with history of chronic constipation, anxiety, depression, GERD, hypertension hyperlipidemia and hypothyroidism, Turner syndrome and hard of hearing presented with not having bowel movement for more than 10 days, not eating, abdominal pain, distention and discomfort.  In the emergency room white cell count was 37.6.  CT scan abdomen showed a small pneumoperitoneum concerning for bowel perforation.  Admitted with surgical consultation.  Anticipating conservative management. Day 5 in the hospital , no bowel functions yet.   Assessment & Plan:   Pneumoperitoneum with bowel perforation Severe constipation.  CT scan 11/20 with a small pneumoperitoneum, sigmoid diverticulosis.  WBC count 38,000. N.p.o., NG tube for decompression to suction, adequate pain medications, IV antibiotic with Zosyn.  Biochemically improving and WBC count is improving. Surgery continues to follow for any surgical need. Patient continues to take MiraLAX and stool softener. Dose of Dulcolax suppository, no bowel movement.  Unable to eat, day 5 today in the hospital.  She has not been able to eat for more than 2 weeks at home.  No bowel function yet. PICC line.  start TPN.  Dehydration with hypernatremia and acute kidney injury: Due to poor oral intake and ACE inhibitors.  Adequately resuscitated today.  Continue on maintenance IV fluids with potassium.  Hypokalemia: Replace with IV fluid.  Acute urinary retention: Needing frequent to straight cath.  Insert Foley catheter.  Urine output is adequate.  Chronic medical issues including essential hypertension, hypothyroidism: Patient is n.p.o. with NG tube on suction.  Continue to hold oral medications until she is able to take by mouth.  Blood pressures are stable.  TSH is 6.3.  Unable to take by mouth.  We will start half dose  of IV thyroxine today that may help with motility.  Continue to mobilize with PT/OT  Severe hypophosphatemia: Replace aggressively.  Nutrition Status: Nutrition Problem: Moderate Malnutrition Etiology: acute illness Signs/Symptoms: energy intake < or equal to 50% for > or equal to 5 days, moderate fat depletion, moderate muscle depletion Interventions: TPN Start TPN   DVT prophylaxis: heparin injection 5,000 Units Start: 11/29/21 1400   Code Status: DNR Family Communication: Brother at the bedside. Disposition Plan: Status is: Inpatient Remains inpatient appropriate because: Active treatment, NG tube     Consultants:  General surgery  Procedures:  None  Antimicrobials:  Zosyn 11/20---   Subjective:  Patient seen and examined. Remains pretty comfortable today.  Urine output is adequate.  Patient denies any abdominal pain or distention.  She could not tolerate Colace through the NG tube.  No bowel movement.  Nursing reported 2 small smears of stool yesterday afternoon. PICC line placed.  Will start on TPN.    Objective: Vitals:   12/02/21 1648 12/02/21 1932 12/03/21 0349 12/03/21 0748  BP:  135/84 128/69 117/82  Pulse:  93 97 92  Resp:  18 17 18   Temp:  97.9 F (36.6 C) 98.2 F (36.8 C) 97.8 F (36.6 C)  TempSrc:      SpO2:  92% 92% 91%  Weight: 50.6 kg     Height:        Intake/Output Summary (Last 24 hours) at 12/03/2021 1340 Last data filed at 12/03/2021 1009 Gross per 24 hour  Intake 1350.62 ml  Output 1350 ml  Net 0.62 ml   Filed Weights   11/29/21 0729 12/02/21 1648  Weight: 45 kg 50.6 kg  Examination:  General exam: Appears calm and comfortable .  Able to interact today and keep up conversation but feels very weak. Respiratory system: Clear to auscultation. Respiratory effort normal.  On room air.  Conducted upper airway sounds. Cardiovascular system: S1 & S2 heard, RRR.  Gastrointestinal system: Soft.  Sparse bowel sounds.   Nontender. NGT with minimal free-flowing biliary secretions. Central nervous system: Alert and oriented. No focal neurological deficits.flat affect.  Generalized weakness. Extremities: Symmetric 5 x 5 power.  Generalized weakness.   Data Reviewed: I have personally reviewed following labs and imaging studies  CBC: Recent Labs  Lab 11/29/21 0350 11/30/21 0650 12/01/21 0907 12/02/21 0257 12/03/21 0352  WBC 36.0* 19.2* 19.1* 14.5* 13.2*  NEUTROABS 30.2*  --  17.0* 12.6* 11.1*  HGB 15.4* 15.2* 14.6 14.0 13.7  HCT 45.6 45.7 45.1 40.9 40.2  MCV 92.1 92.0 94.2 92.3 91.6  PLT 285 276 PLATELET CLUMPS NOTED ON SMEAR, UNABLE TO ESTIMATE 235 223   Basic Metabolic Panel: Recent Labs  Lab 12/04/2021 1832 11/29/21 0530 11/29/21 0649 11/29/21 1639 11/30/21 0650 12/01/21 0432 12/02/21 0636 12/03/21 0352  NA  --  144   < > 141 142 146* 142 143  K  --  2.3*   < > 3.3* 4.1 4.3 3.3* 3.7  CL  --  115*   < > 99 97* 109 104 107  CO2  --  18*   < > 29 30 24 27 28   GLUCOSE  --  95   < > 121* 92 120* 149* 122*  BUN  --  53*   < > 69* 68* 59* 35* 26*  CREATININE  --  1.38*   < > 1.84* 1.59* 1.24* 0.86 0.90  CALCIUM  --  6.1*   < > 8.9 8.4* 7.9* 8.1* 8.7*  MG 2.7*  --   --   --   --  2.8* 2.2 2.2  PHOS  --  2.5  --   --   --  2.2*  --  <1.0*   < > = values in this interval not displayed.   GFR: Estimated Creatinine Clearance: 34 mL/min (by C-G formula based on SCr of 0.9 mg/dL). Liver Function Tests: Recent Labs  Lab 12/07/2021 1800 11/29/21 0530 11/29/21 0649 12/01/21 0432 12/03/21 0352  AST 21 15 20 17  35  ALT 22 12 17 17 26   ALKPHOS 83 42 59 60 70  BILITOT 0.9 0.7 1.0 1.0 0.5  PROT 6.9 3.5* 5.4* 5.2* 4.8*  ALBUMIN 3.6 1.6* 2.5* 2.3* 2.0*   Recent Labs  Lab 11/22/2021 1800  LIPASE 26   No results for input(s): "AMMONIA" in the last 168 hours. Coagulation Profile: No results for input(s): "INR", "PROTIME" in the last 168 hours. Cardiac Enzymes: No results for input(s):  "CKTOTAL", "CKMB", "CKMBINDEX", "TROPONINI" in the last 168 hours. BNP (last 3 results) No results for input(s): "PROBNP" in the last 8760 hours. HbA1C: No results for input(s): "HGBA1C" in the last 72 hours. CBG: Recent Labs  Lab 11/30/21 0600 11/30/21 2031  GLUCAP 94 108*   Lipid Profile: Recent Labs    12/03/21 0352  TRIG 159*   Thyroid Function Tests: Recent Labs    12/02/21 0636  TSH 6.382*   Anemia Panel: No results for input(s): "VITAMINB12", "FOLATE", "FERRITIN", "TIBC", "IRON", "RETICCTPCT" in the last 72 hours. Sepsis Labs: Recent Labs  Lab 12/07/2021 2130 11/29/21 1639  LATICACIDVEN 1.9 1.3    Recent Results (from the past 240 hour(s))  Blood  culture (routine x 2)     Status: None   Collection Time: 11/14/2021  8:00 PM   Specimen: BLOOD LEFT HAND  Result Value Ref Range Status   Specimen Description BLOOD LEFT HAND  Final   Special Requests   Final    BOTTLES DRAWN AEROBIC AND ANAEROBIC Blood Culture adequate volume   Culture   Final    NO GROWTH 5 DAYS Performed at [facility], [address] [place], [place] [address]    Report Status 12/03/2021 FINAL  Final  Blood culture (routine x 2)     Status: None   Collection Time: 12/05/2021  8:00 PM   Specimen: BLOOD  Result Value Ref Range Status   Specimen Description BLOOD RIGHT ANTECUBITAL  Final   Special Requests   Final    BOTTLES DRAWN AEROBIC AND ANAEROBIC Blood Culture adequate volume   Culture   Final    NO GROWTH 5 DAYS Performed at [facility], [address], [place], [place] [address]    Report Status 12/03/2021 FINAL  Final         Radiology Studies: [place] EKG SITE RITE  Result Date: 12/03/2021 If Site Rite image not attached, placement could not be confirmed due to current cardiac rhythm.  DG Abd 1 View  Result Date: 12/02/2021 CLINICAL DATA:  Nasogastric tube placement. EXAM: ABDOMEN - 1 VIEW COMPARISON:  Same day. FINDINGS: Nasogastric tube tip is seen in expected  position of proximal stomach. Stable small bowel dilatation is noted concerning for ileus or partial small bowel obstruction. Stool is noted in nondilated colon. IMPRESSION: Nasogastric tube tip is seen in expected position of proximal stomach. Stable small bowel dilatation as described above. Electronically Signed   By: Lupita Raider M.D.   On: 12/02/2021 10:06   DG Abd 1 View  Result Date: 12/02/2021 CLINICAL DATA:  Abdominal distension EXAM: ABDOMEN - 1 VIEW COMPARISON:  11/26/2021 FINDINGS: Small amount retained contrast in RIGHT colon. Dilated small bowel loops increased since previous exam question small bowel obstruction. No bowel wall thickening. Degenerative changes lumbar spine. Nasogastric tube coiled in distal esophagus. Surgical clips mid abdomen. IMPRESSION: Increased small bowel distension question small bowel obstruction. Nasogastric tube coiled in distal esophagus, recommend removal and replacement. Findings called to [facility] on 2W on 12/02/2021 at 0853 hours. Electronically Signed   By: Ulyses Southward M.D.   On: 12/02/2021 08:54   [place] EKG SITE RITE  Result Date: 12/02/2021 If Site Rite image not attached, placement could not be confirmed due to current cardiac rhythm.       Scheduled Meds:  Chlorhexidine Gluconate Cloth  6 each Topical Daily   docusate  200 mg Oral BID   heparin injection (subcutaneous)  5,000 Units Subcutaneous Q8H   insulin aspart  0-9 Units Subcutaneous Q4H   pantoprazole (PROTONIX) IV  40 mg Intravenous Q12H   polyethylene glycol  17 g Oral BID   sodium chloride flush  10-40 mL Intracatheter Q12H   Continuous Infusions:  dextrose 5 % and 0.45 % NaCl with KCl 40 mEq/L 100 mL/hr at 12/03/21 1247   dextrose 5 % and 0.45 % NaCl with KCl 40 mEq/L     piperacillin-tazobactam (ZOSYN)  IV 3.375 g (12/03/21 1247)   sodium phosphate 30 mmol in dextrose 5 % 250 mL infusion 30 mmol (12/03/21 0622)   TPN ADULT (ION)       LOS: 5 days    Time spent: 35  minutes    Barb Merino, MD [facility] Pager [phone]

## 2021-12-03 NOTE — Progress Notes (Signed)
Phos came back 2.4 this PM with the dose infusing. D/w Dr Margo Aye, we will hold off giving anymore and recheck level in AM.   Ulyses Southward, PharmD, BCIDP, AAHIVP, CPP Infectious Disease Pharmacist 12/03/2021 9:46 PM

## 2021-12-03 NOTE — Progress Notes (Signed)
PT Cancellation Note  Patient Details Name: Kristi Erickson MRN: [id number] DOB: 1940-08-17   Cancelled Treatment:    Reason Eval/Treat Not Completed: Other (comment). Pt having BM.    Angelina Ok [facility] 12/03/2021, 3:53 PM Skip Mayer PT Acute [employer] [phone]

## 2021-12-03 NOTE — Progress Notes (Signed)
Patient refused PICC line this morning. This nurse entered room to give medications. NG tube was to be clamped for one hour after PO medication administration per verbal order by Dr. Jerral Ralph. After patient attempted to drink liquid colace, she immediately spit the liquid back up. This nurse attached NG tube back to LIS. Explained reasons that PICC line was ordered and neccessary, patient now agreeable to PICC placement. MD and IV team made aware.

## 2021-12-03 NOTE — Progress Notes (Signed)
PHARMACY - TOTAL PARENTERAL NUTRITION CONSULT NOTE   Indication:  prolonged NPO status due to nausea/vomiting in the setting of pneumoperitoneum and constipation   Patient Measurements: Height: _0  (149.9 cm) Weight: 50.6 kg (111 lb 8.8 oz) IBW/kg (Calculated) : 43.2 TPN AdjBW (KG): 45 Body mass index is 22.53 kg/m. Usual Weight: 48kg, ~ 105/106 lbs per patient   Assessment: Patient with PMH notable for aortic insufficiency, hypertension, HLD, hypothyroidism, Turner syndrome, horseshoe kidney presented with initial concern for a small bowel obstruction. Patient reports no bowel movement in 10 days. Throughout this time she has had poor oral intake saying she only ate 1-2 crackers per day prior to admission. Prior to this, patient had adequate oral intake stating she ate 3 meals per day. Average meal included protein, carbs, and some fat (for ex: breakfast: 1-2 eggs, sausage, and english muffin). General surgery consulted for pneumoperitoneum seen on CT scan.  Surgery recommending NPO status and medical management for now.   Of note, patient has shellfish allergy reported. Patient confirms allergy in the chart with reported reaction of nausea and vomiting. Patient reports that she has tolerated other fish besides oysters and scallops. No anaphylaxis reported in the past.   Glucose / Insulin: no history of diabetes noted, last A1C from several years ago 5.5%  Electrolytes: phos <1.0 (25mol NaPhos ordered per MD), K: 3.7 currently on K containing MIVF, Na: 143, Mag: 2.2, CoCa: 10.3, HCO: 28, Cl: 107  Renal: AKI earlier in admission now improved, Scr: 0.90, BUN: 26   Hepatic: AST/ALT WNL, Alk phos: 70, TG 159, albumin: 2.0, T-bili: 0.5  Intake / Output; MIVF: UOP: 0.554mkg/hr, NS + 2046mKcl @ 100m39m, NG -500mL10m Imaging: 11/20: CT abdomen, small pneumoperitoneum  GI Surgeries / Procedures: none   Central access: PICC line currently being placed  TPN start date: 11/25   Nutritional  Goals: Goal TPN rate is 55 mL/hr (provides 65 g of protein and 1350 kcals per day (30kcal/kg) Estimated Needs Total Energy Estimated Needs: 1350-1575 kcals Total Protein Estimated Needs: 65-80 gm Total Fluid Estimated Needs: >/= 1.3 L  Current Nutrition:  NPO TPN   Plan:  Start TPN at 25mL/11mt 1800, will meet approximately 50% of nutritional needs providing 614kcal and 29.7g AA.  Electrolytes in TPN: Na 50mEq/73mmpirically reduce K to 35mEq/ 16mal 21mEq gi57mMVIF containing K), Ca 5mEq/L, M14mmEq/L, an40mmpirically increase Phos 18mmol/L ba36mon undetectable level this AM. Cl:Ac 1:2 given chloride borderline high.  Will recheck electrolytes at 20:00 given phos < 1.0 this morning and high risk for refeeding syndrome.  Add standard MVI and trace elements to TPN Initiate Sensitive q4h SSI at 1800 and adjust as needed  Reduce MIVF to 75 mL/hr at 1800 Monitor TPN labs on Mon/Thurs, daily until stabilization  Lahna Nath W WiKelly Splinter5/2023,7:47 AM

## 2021-12-03 NOTE — Progress Notes (Signed)
Peripherally Inserted Central Catheter Placement  The IV Nurse has discussed with the patient and/or persons authorized to consent for the patient, the purpose of this procedure and the potential benefits and risks involved with this procedure.  The benefits include less needle sticks, lab draws from the catheter, and the patient may be discharged home with the catheter. Risks include, but not limited to, infection, bleeding, blood clot (thrombus formation), and puncture of an artery; nerve damage and irregular heartbeat and possibility to perform a PICC exchange if needed/ordered by physician.  Alternatives to this procedure were also discussed.  Bard Power PICC patient education guide, fact sheet on infection prevention and patient information card has been provided to patient /or left at bedside.    PICC Placement Documentation  PICC Double Lumen 12/03/21 Right Brachial 35 cm 0 cm (Active)  Indication for Insertion or Continuance of Line Administration of hyperosmolar/irritating solutions (i.e. TPN, Vancomycin, etc.) 12/03/21 1201  Exposed Catheter (cm) 0 cm 12/03/21 1201  Site Assessment Clean, Dry, Intact 12/03/21 1201  Lumen #1 Status Flushed;Saline locked;Blood return noted 12/03/21 1201  Lumen #2 Status Flushed;Saline locked;Blood return noted 12/03/21 1201  Dressing Type Transparent;Securing device 12/03/21 1201  Dressing Status Antimicrobial disc in place 12/03/21 1201  Dressing Intervention New dressing;Other (Comment) 12/03/21 1201  Dressing Change Due 12/09/21 12/03/21 1201       Reginia Forts [place]ce 12/03/2021, 12:03 PM

## 2021-12-03 NOTE — Progress Notes (Signed)
Progress Note     Subjective: Had a BM this AM.  Denies nausea  Objective: Vital signs in last 24 hours: Temp:  [97.8 F (36.6 C)-98.2 F (36.8 C)] 97.8 F (36.6 C) (11/25 0748) Pulse Rate:  [92-97] 92 (11/25 0748) Resp:  [15-18] 18 (11/25 0748) BP: (117-135)/(69-84) 117/82 (11/25 0748) SpO2:  [91 %-95 %] 91 % (11/25 0748) Weight:  [50.6 kg] 50.6 kg (11/24 1648) Last BM Date : 12/03/21  Intake/Output from previous day: 11/24 0701 - 11/25 0700 In: 1350.6 [P.O.:50; I.V.:1037.8; IV Piggyback:262.8] Out: 1150 [Urine:650; Emesis/NG output:500] Intake/Output this shift: Total I/O In: -  Out: 200 [Urine:200]  PE: Gen:  Alert, NAD, pleasant Abd: min distended but soft. non ttp. Well healed prior midline abdominal scar. NGT in place with light bilious output in cannister.    Lab Results:  Recent Labs    12/02/21 0257 12/03/21 0352  WBC 14.5* 13.2*  HGB 14.0 13.7  HCT 40.9 40.2  PLT 235 223    BMET Recent Labs    12/02/21 0636 12/03/21 0352  NA 142 143  K 3.3* 3.7  CL 104 107  CO2 27 28  GLUCOSE 149* 122*  BUN 35* 26*  CREATININE 0.86 0.90  CALCIUM 8.1* 8.7*    PT/INR No results for input(s): "LABPROT", "INR" in the last 72 hours. CMP     Component Value Date/Time   NA 143 12/03/2021 0352   NA 137 07/07/2015 0000   K 3.7 12/03/2021 0352   CL 107 12/03/2021 0352   CO2 28 12/03/2021 0352   GLUCOSE 122 (H) 12/03/2021 0352   BUN 26 (H) 12/03/2021 0352   BUN 12 07/07/2015 0000   CREATININE 0.90 12/03/2021 0352   CREATININE 0.69 12/25/2016 1514   CALCIUM 8.7 (L) 12/03/2021 0352   PROT 4.8 (L) 12/03/2021 0352   ALBUMIN 2.0 (L) 12/03/2021 0352   AST 35 12/03/2021 0352   ALT 26 12/03/2021 0352   ALKPHOS 70 12/03/2021 0352   BILITOT 0.5 12/03/2021 0352   GFRNONAA >60 12/03/2021 0352   GFRNONAA 85 12/25/2016 1514   GFRAA 99 12/25/2016 1514   Lipase     Component Value Date/Time   LIPASE 26 20-Dec-2021 1800       Studies/Results: [place] EKG SITE  RITE  Result Date: 12/03/2021 If Site Rite image not attached, placement could not be confirmed due to current cardiac rhythm.  DG Abd 1 View  Result Date: 12/02/2021 CLINICAL DATA:  Nasogastric tube placement. EXAM: ABDOMEN - 1 VIEW COMPARISON:  Same day. FINDINGS: Nasogastric tube tip is seen in expected position of proximal stomach. Stable small bowel dilatation is noted concerning for ileus or partial small bowel obstruction. Stool is noted in nondilated colon. IMPRESSION: Nasogastric tube tip is seen in expected position of proximal stomach. Stable small bowel dilatation as described above. Electronically Signed   By: Lupita Raider M.D.   On: 12/02/2021 10:06   DG Abd 1 View  Result Date: 12/02/2021 CLINICAL DATA:  Abdominal distension EXAM: ABDOMEN - 1 VIEW COMPARISON:  2021/12/20 FINDINGS: Small amount retained contrast in RIGHT colon. Dilated small bowel loops increased since previous exam question small bowel obstruction. No bowel wall thickening. Degenerative changes lumbar spine. Nasogastric tube coiled in distal esophagus. Surgical clips mid abdomen. IMPRESSION: Increased small bowel distension question small bowel obstruction. Nasogastric tube coiled in distal esophagus, recommend removal and replacement. Findings called to [facility] on 2W on 12/02/2021 at 0853 hours. Electronically Signed   By: Loraine Leriche  Thornton Papas M.D.   On: 12/02/2021 08:54   [place] EKG SITE RITE  Result Date: 12/02/2021 If Site Rite image not attached, placement could not be confirmed due to current cardiac rhythm.   Anti-infectives: Anti-infectives (From admission, onward)    Start     Dose/Rate Route Frequency Ordered Stop   12/02/21 2200  piperacillin-tazobactam (ZOSYN) IVPB 3.375 g        3.375 g 12.5 mL/hr over 240 Minutes Intravenous Every 8 hours 12/02/21 1340     11/29/21 0600  piperacillin-tazobactam (ZOSYN) IVPB 2.25 g  Status:  Discontinued        2.25 g 100 mL/hr over 30 Minutes Intravenous Every 8  hours 11/20/2021 2342 12/02/21 1340   11/14/2021 2345  piperacillin-tazobactam (ZOSYN) IVPB 3.375 g  Status:  Discontinued        3.375 g 12.5 mL/hr over 240 Minutes Intravenous  Once 11/09/2021 2330 11/29/21 0003   11/14/2021 2115  piperacillin-tazobactam (ZOSYN) IVPB 3.375 g        3.375 g 100 mL/hr over 30 Minutes Intravenous  Once 11/30/2021 2113 11/21/2021 2345        Assessment/Plan  Chronic constipation, no BM in 10d PTA Small volume pneumoperitoneum on CT - Afebrile and  abdomen soft without peritonitis. No indication for emergency surgery at this time.  - Continue conservative management with NPO, NGT, IV abx for now and follow abdominal exam. She has received bowel regimen here. Hopefully she will cont to improve with conservative management.  - Clamp NG today   FEN: NPO/NGT clamp ID: zosyn. WBC 37.6 > 36 > 19.2 (11/22). VTE: SCDs, Heparin subq   Per primary: Dehydration AKI Anxiety/depression GERD HLD HTN Hypothyroidism IBS Turner syndrome hard of hearing  History of ascending and arch aneurysm with graft 2017 H/o prior abdominal surgery, at least surgery for ovarian cyst but she is unable to remember further surgical history   LOS: 5 days     Rosario Adie, [facility] 12/03/2021, 11:04 AM Please see Amion for pager number during day hours 7:00am-4:30pm

## 2021-12-04 ENCOUNTER — Inpatient Hospital Stay (HOSPITAL_COMMUNITY): Payer: Medicare Other

## 2021-12-04 DIAGNOSIS — K668 Other specified disorders of peritoneum: Secondary | ICD-10-CM | POA: Diagnosis not present

## 2021-12-04 LAB — GLUCOSE, CAPILLARY
Glucose-Capillary: 147 mg/dL — ABNORMAL HIGH (ref 70–99)
Glucose-Capillary: 143 mg/dL — ABNORMAL HIGH (ref 70–99)
Glucose-Capillary: 143 mg/dL — ABNORMAL HIGH (ref 70–99)
Glucose-Capillary: 173 mg/dL — ABNORMAL HIGH (ref 70–99)

## 2021-12-04 LAB — BASIC METABOLIC PANEL
Anion gap: 11 (ref 5–15)
BUN: 18 mg/dL (ref 8–23)
CO2: 27 mmol/L (ref 22–32)
Calcium: 8.1 mg/dL — ABNORMAL LOW (ref 8.9–10.3)
Chloride: 105 mmol/L (ref 98–111)
Creatinine, Ser: 0.75 mg/dL (ref 0.44–1.00)
GFR, Estimated: >60 mL/min (ref >60)
Glucose, Bld: 136 mg/dL — ABNORMAL HIGH (ref 70–99)
Potassium: 3.1 mmol/L — ABNORMAL LOW (ref 3.5–5.1)
Sodium: 143 mmol/L (ref 135–145)

## 2021-12-04 LAB — MAGNESIUM: Magnesium: 2 mg/dL (ref 1.7–2.4)

## 2021-12-04 LAB — PHOSPHORUS: Phosphorus: 4.4 mg/dL (ref 2.5–4.6)

## 2021-12-04 MED ORDER — POTASSIUM CHLORIDE 10 MEQ/100ML IV SOLN
10.0000 meq | INTRAVENOUS | Status: AC
  Administered 2021-12-04 (×2): 10 meq via INTRAVENOUS
  Filled 2021-12-04 (×3): qty 100

## 2021-12-04 MED ORDER — TRAVASOL 10 % IV SOLN
INTRAVENOUS | Status: AC
  Filled 2021-12-04: qty 653.4

## 2021-12-04 MED ORDER — FUROSEMIDE 10 MG/ML IJ SOLN
40.0000 mg | Freq: Once | INTRAMUSCULAR | Status: AC
  Administered 2021-12-04: 40 mg via INTRAVENOUS
  Filled 2021-12-04: qty 4

## 2021-12-04 MED ORDER — LEVOTHYROXINE SODIUM 88 MCG PO TABS
88.0000 ug | ORAL_TABLET | Freq: Every day | ORAL | Status: DC
  Administered 2021-12-04 – 2021-12-06 (×3): 88 ug via ORAL
  Filled 2021-12-04 (×3): qty 1

## 2021-12-04 NOTE — Progress Notes (Addendum)
PHARMACY - TOTAL PARENTERAL NUTRITION CONSULT NOTE   Indication:  prolonged NPO status due to nausea/vomiting in the setting of pneumoperitoneum and constipation   Patient Measurements: Height: _0  (149.9 cm) Weight: 50.6 kg (111 lb 8.8 oz) IBW/kg (Calculated) : 43.2 TPN AdjBW (KG): 45 Body mass index is 22.53 kg/m. Usual Weight: 48kg, ~ 105/106 lbs per patient   Assessment: Patient with PMH notable for aortic insufficiency, hypertension, HLD, hypothyroidism, Turner syndrome, horseshoe kidney presented with initial concern for a small bowel obstruction. Patient reports no bowel movement in 10 days. Throughout this time she has had poor oral intake saying she only ate 1-2 crackers per day prior to admission. Prior to this, patient had adequate oral intake stating she ate 3 meals per day. Average meal included protein, carbs, and some fat (for ex: breakfast: 1-2 eggs, sausage, and english muffin). General surgery consulted for pneumoperitoneum seen on CT scan.  Surgery recommending NPO status and medical management for now.   Of note, patient has shellfish allergy reported. Patient confirms allergy in the chart with reported reaction of nausea and vomiting. Patient reports that she has tolerated other fish besides oysters and scallops. No anaphylaxis reported in the past.   Glucose / Insulin: no history of diabetes noted, last A1C from several years ago 5.5%  Blood sugars controlled, 2u SSI/24h Electrolytes: phos: 4.4 (s/p 70mol NaPhos ordered per MD), K: 3.1, Na: 143, Mag: 2.0, CoCa: 9.7, HCO: 27, Cl: 105 Renal: Scr: 0.75, BUN: WNL  Hepatic: AST/ALT WNL, Alk phos: 70, TG 159, albumin: 2.0, T-bili: 0.5  Intake / Output; MIVF: UOP: 0.38mkg/hr, NG tube removed, MVIF stopped, LBM 11/25  GI Imaging: 11/20: CT abdomen, small pneumoperitoneum  GI Surgeries / Procedures: none   Central access: PICC line placed 11/25  TPN start date: 11/25   Nutritional Goals: Goal TPN rate is 55 mL/hr  (provides 65 g of protein and 1350 kcals per day (30kcal/kg) Estimated Needs Total Energy Estimated Needs: 1350-1575 kcals Total Protein Estimated Needs: 65-80 gm Total Fluid Estimated Needs: >/= 1.3 L  Current Nutrition:  NPO except meds with sips (potential for sips today per MD)   TPN   Plan:  Increase TPN to goal of 5514mr at 1800, will meet approximately 100% of nutritional needs providing 1350kcal and 65g AA. Will monitor closely as patient is high risk for refeeding syndrome.  Electrolytes in TPN: Na 58m61m, increase K to 58mE51m(66mEq75mal increased from 21mEq)45m 5mEq/L,12m 5mEq/L, 66m further increase Phos 20mmol/L 54md on high replacement requirements on 11/25. Cl:Ac 1:2 given chloride borderline high.  Will replace K this morning outside TPN with 58mEq KCL,20m level likely due to MVIF being stopped overnight.  Add standard MVI and trace elements to TPN Initiate Sensitive q4h SSI at 1800 and adjust as needed  Monitor TPN labs on Mon/Thurs, daily until stabilization F/u diet advancement for TPN wean.   Kristi Erickson W WVentura Sellers,7:13 AM

## 2021-12-04 NOTE — Progress Notes (Signed)
Physical Therapy Treatment Patient Details Name: Kristi Erickson MRN: [id number] DOB: 1940/12/24 Today's Date: 12/04/2021   History of Present Illness 81 yo female admitted 11/20 with pneumoperitoneum. PMhx; AKI, HLD, HTN, IBS, GERD, HOH, Turner syndrome    PT Comments    Patient seen for limited session (limited by diarrhea in standing, which pt reports is chronic and that she normally wears "Depends"). Patient requiring increased assist with bed mobility and transfer compared to prior date. Patient agrees she has gotten weaker and now is in agreement with going to SNF prior to returning home. Discharge plan updated.    Recommendations for follow up therapy are one component of a multi-disciplinary discharge planning process, led by the attending physician.  Recommendations may be updated based on patient status, additional functional criteria and insurance authorization.  Follow Up Recommendations  Skilled nursing-short term rehab (<3 hours/day) Can patient physically be transported by private vehicle: No   Assistance Recommended at Discharge Frequent or constant Supervision/Assistance  Patient can return home with the following A little help with walking and/or transfers;A lot of help with bathing/dressing/bathroom;Assistance with cooking/housework;Help with stairs or ramp for entrance   Equipment Recommendations  BSC/3in1    Recommendations for Other Services       Precautions / Restrictions Precautions Precautions: Fall;Other (comment) Restrictions Weight Bearing Restrictions: No     Mobility  Bed Mobility Overal bed mobility: Needs Assistance Bed Mobility: Rolling, Sit to Supine, Sidelying to Sit Rolling: Min assist Sidelying to sit: Min assist   Sit to supine: Min assist   General bed mobility comments: required vc for technique to roll and come up to sit (slow processing how to do task); physical assist to raise torso and later to raise legs back onto bed     Transfers Overall transfer level: Needs assistance Equipment used: Rolling walker (2 wheels) Transfers: Sit to/from Stand Sit to Stand: Min assist           General transfer comment: Cues for hand placement, however pt insisted on pulling up on RW (she is accustomed to rollator with brakes and she pulls up to stand); RW anchored by PT with boosting assist to come to full stand    Ambulation/Gait               General Gait Details: pt with immediate diarrhea upon standing and returned to sitting at EOB; pt then reports that this is a chronic issue for her and she normally wears "Depends" Nurse tech in to give pt a bath and assisted pt to supine for pericare and bath by NT   Stairs             Wheelchair Mobility    Modified Rankin (Stroke Patients Only)       Balance Overall balance assessment: Needs assistance Sitting-balance support: No upper extremity supported, Feet supported Sitting balance-Leahy Scale: Fair     Standing balance support: Reliant on assistive device for balance, Bilateral upper extremity supported Standing balance-Leahy Scale: Poor Standing balance comment: min assist and RW for balance, cues for upright posture                            Cognition Arousal/Alertness: Awake/alert Behavior During Therapy: Flat affect Overall Cognitive Status: No family/caregiver present to determine baseline cognitive functioning Area of Impairment: Safety/judgement                       Following  Commands: Follows one step commands with increased time, Follows one step commands consistently Safety/Judgement: Decreased awareness of safety, Decreased awareness of deficits     General Comments: easily aroused; oriented person, place, situation (time NT)        Exercises      General Comments        Pertinent Vitals/Pain Pain Assessment Pain Assessment: No/denies pain    Home Living                           Prior Function            PT Goals (current goals can now be found in the care plan section) Acute Rehab PT Goals Patient Stated Goal: go to rehab and get stronger Time For Goal Achievement: 12/15/21 Potential to Achieve Goals: Fair Progress towards PT goals: Not progressing toward goals - comment (limited by diarrhea)    Frequency    Min 2X/week      PT Plan Discharge plan needs to be updated;Frequency needs to be updated    Co-evaluation              AM-PAC PT "6 Clicks" Mobility   Outcome Measure  Help needed turning from your back to your side while in a flat bed without using bedrails?: A Little Help needed moving from lying on your back to sitting on the side of a flat bed without using bedrails?: A Little Help needed moving to and from a bed to a chair (including a wheelchair)?: A Lot Help needed standing up from a chair using your arms (e.g., wheelchair or bedside chair)?: A Little Help needed to walk in hospital room?: Total Help needed climbing 3-5 steps with a railing? : Total 6 Click Score: 13    End of Session Equipment Utilized During Treatment: Gait belt Activity Tolerance: Treatment limited secondary to medical complications (Comment) (limited by diarrhea) Patient left: in bed;with nursing/sitter in room Nurse Communication: Mobility status PT Visit Diagnosis: Other abnormalities of gait and mobility (R26.89);Muscle weakness (generalized) (M62.81)     Time: 1410-1430 PT Time Calculation (min) (ACUTE ONLY): 20 min  Charges:  $Therapeutic Activity: 8-22 mins                      Jerolyn Center, PT Acute Rehabilitation Services  Office [phone]    Kristi Erickson 12/04/2021, 2:53 PM

## 2021-12-04 NOTE — Progress Notes (Signed)
PROGRESS NOTE    Kristi Erickson  [id number] DOB: [birth year] DOA: 12/03/2021 PCP: Hillary Bow    Brief Narrative:  81 year old with history of chronic constipation, anxiety, depression, GERD, hypertension hyperlipidemia and hypothyroidism, Turner syndrome and hard of hearing presented with not having bowel movement for more than 10 days, not eating, abdominal pain, distention and discomfort.  In the emergency room white cell count was 37.6.  CT scan abdomen showed a small pneumoperitoneum concerning for bowel perforation.  Admitted with surgical consultation. Remained in the hospital with poor bowel function recovery.  Assessment & Plan:   Pneumoperitoneum with bowel perforation Severe constipation.  CT scan 11/20 with a small pneumoperitoneum, sigmoid diverticulosis.  WBC count 38,000. Bowel functions improving.  Small amount of bowel movements. NG tube is out.  Will start on clears.  Repeat KUB today with dilated small bowel loops, however with some improvement. Clear liquid diet, mobility.  Continue bowel regimen with MiraLAX and a stool softener. Unable to eat for prolonged duration, currently on TPN. We will continue antibiotics for total 7 days.  Dehydration with hypernatremia and acute kidney injury: Due to poor oral intake and ACE inhibitors.   Hypokalemia: Replace with IV fluid.  TPN being managed by pharmacy.  Acute urinary retention: Needing frequent to straight cath.  Mental status improving.  Remove Foley catheter today.  Essential hypertension: Stable.  Continue to hold antihypertensives.    Hypothyroidism: TSH 6.3.  Resume thyroxine.    Severe hypophosphatemia: Replace with TPN.   Nutrition Status: Nutrition Problem: Moderate Malnutrition Etiology: acute illness Signs/Symptoms: energy intake < or equal to 50% for > or equal to 5 days, moderate fat depletion, moderate muscle depletion Interventions: TPN Currently on TPN  Out of bed.  PT OT.   Chest physiotherapy.   DVT prophylaxis: heparin injection 5,000 Units Start: 11/29/21 1400   Code Status: DNR Family Communication: Brother on the phone. Disposition Plan: Status is: Inpatient Remains inpatient appropriate because: Active treatment in the hospital.  No oral intake.  On TPN. Patient will be reevaluated by PT OT.  She has to go to a skilled nursing facility for rehab before returning home due to severe frailty and debility developed from hospitalization.     Consultants:  General surgery  Procedures:  None  Antimicrobials:  Zosyn 11/20---   Subjective:  Patient seen and examined.  No overnight events.  Denies any nausea vomiting abdominal pain or distention.  Nursing reported.  Small amount of 2 bowel movements. Patient was noted with increasing secretions and noisy breathing today.  On room air. Starting on chest physiotherapy.  We will give 1 dose of Lasix.  Discontinue IV fluids but keep on TPN.  Will allow clear liquid diet. Discussed with surgery at the bedside.    Objective: Vitals:   12/03/21 1551 12/03/21 1945 12/04/21 0304 12/04/21 0838  BP: (!) 112/99 131/82 132/77 (!) 139/98  Pulse: 91 90 75 95  Resp: 18 16 16 16   Temp: (!) 97.4 F (36.3 C) 97.9 F (36.6 C) 97.9 F (36.6 C)   TempSrc: Oral  Oral   SpO2: 93% 94% 93% 93%  Weight:      Height:        Intake/Output Summary (Last 24 hours) at 12/04/2021 1130 Last data filed at 12/04/2021 0434 Gross per 24 hour  Intake 3491.61 ml  Output 200 ml  Net 3291.61 ml   Filed Weights   11/29/21 0729 12/02/21 1648  Weight: 45 kg 50.6 kg  Examination:  General exam: Appears calm and comfortable .  Frail and debilitated. Respiratory system: Respiratory effort normal.  On room air.  Conducted upper airway sounds. Cardiovascular system: S1 & S2 heard, RRR.  Gastrointestinal system: Soft.  Bowel sounds present.  Nontender. Central nervous system: Alert and oriented. No focal neurological  deficits.flat affect.  Generalized weakness. Extremities: Symmetric 5 x 5 power.  Generalized weakness.   Data Reviewed: I have personally reviewed following labs and imaging studies  CBC: Recent Labs  Lab 11/29/21 0350 11/30/21 0650 12/01/21 0907 12/02/21 0257 12/03/21 0352  WBC 36.0* 19.2* 19.1* 14.5* 13.2*  NEUTROABS 30.2*  --  17.0* 12.6* 11.1*  HGB 15.4* 15.2* 14.6 14.0 13.7  HCT 45.6 45.7 45.1 40.9 40.2  MCV 92.1 92.0 94.2 92.3 91.6  PLT 285 276 PLATELET CLUMPS NOTED ON SMEAR, UNABLE TO ESTIMATE 235 [id number]   Basic Metabolic Panel: Recent Labs  Lab 11/20/2021 1832 11/29/21 0530 11/29/21 0649 11/30/21 0650 12/01/21 0432 12/02/21 0636 12/03/21 0352 12/03/21 2018 12/04/21 0410  NA  --  144   < > 142 146* 142 143  --  143  K  --  2.3*   < > 4.1 4.3 3.3* 3.7  --  3.1*  CL  --  115*   < > 97* 109 104 107  --  105  CO2  --  18*   < > 30 24 27 28   --  27  GLUCOSE  --  95   < > 92 120* 149* 122*  --  136*  BUN  --  53*   < > 68* 59* 35* 26*  --  18  CREATININE  --  1.38*   < > 1.59* 1.24* 0.86 0.90  --  0.75  CALCIUM  --  6.1*   < > 8.4* 7.9* 8.1* 8.7*  --  8.1*  MG 2.7*  --   --   --  2.8* 2.2 2.2  --  2.0  PHOS  --  2.5  --   --  2.2*  --  <1.0* 2.4* 4.4   < > = values in this interval not displayed.   GFR: Estimated Creatinine Clearance: 38.3 mL/min (by C-G formula based on SCr of 0.75 mg/dL). Liver Function Tests: Recent Labs  Lab 11/30/2021 1800 11/29/21 0530 11/29/21 0649 12/01/21 0432 12/03/21 0352  AST 21 15 20 17  35  ALT 22 12 17 17 26   ALKPHOS 83 42 59 60 70  BILITOT 0.9 0.7 1.0 1.0 0.5  PROT 6.9 3.5* 5.4* 5.2* 4.8*  ALBUMIN 3.6 1.6* 2.5* 2.3* 2.0*   Recent Labs  Lab 11/18/2021 1800  LIPASE 26   No results for input(s): "AMMONIA" in the last 168 hours. Coagulation Profile: No results for input(s): "INR", "PROTIME" in the last 168 hours. Cardiac Enzymes: No results for input(s): "CKTOTAL", "CKMB", "CKMBINDEX", "TROPONINI" in the last 168 hours. BNP  (last 3 results) No results for input(s): "PROBNP" in the last 8760 hours. HbA1C: No results for input(s): "HGBA1C" in the last 72 hours. CBG: Recent Labs  Lab 12/03/21 1550 12/03/21 2028 12/03/21 2343 12/04/21 0452 12/04/21 0826  GLUCAP 116* 137* 120* 147* 143*   Lipid Profile: Recent Labs    12/03/21 0352  TRIG 159*   Thyroid Function Tests: Recent Labs    12/02/21 0636  TSH 6.382*   Anemia Panel: No results for input(s): "VITAMINB12", "FOLATE", "FERRITIN", "TIBC", "IRON", "RETICCTPCT" in the last 72 hours. Sepsis Labs: Recent Labs  Lab 11/09/2021 2130 11/29/21  1639  LATICACIDVEN 1.9 1.3    Recent Results (from the past 240 hour(s))  Blood culture (routine x 2)     Status: None   Collection Time: 12/02/2021  8:00 PM   Specimen: BLOOD LEFT HAND  Result Value Ref Range Status   Specimen Description BLOOD LEFT HAND  Final   Special Requests   Final    BOTTLES DRAWN AEROBIC AND ANAEROBIC Blood Culture adequate volume   Culture   Final    NO GROWTH 5 DAYS Performed at [facility], [address] [place], [place] [address]    Report Status 12/03/2021 FINAL  Final  Blood culture (routine x 2)     Status: None   Collection Time: 12/08/2021  8:00 PM   Specimen: BLOOD  Result Value Ref Range Status   Specimen Description BLOOD RIGHT ANTECUBITAL  Final   Special Requests   Final    BOTTLES DRAWN AEROBIC AND ANAEROBIC Blood Culture adequate volume   Culture   Final    NO GROWTH 5 DAYS Performed at [facility], [address], [place], [place] [address]    Report Status 12/03/2021 FINAL  Final         Radiology Studies: DG Abd Portable 1V  Result Date: 12/04/2021 CLINICAL DATA:  Ileus. EXAM: PORTABLE ABDOMEN - 1 VIEW COMPARISON:  December 02, 2021. FINDINGS: Stable small bowel dilatation is noted concerning for distal small obstruction or possibly ileus. Stool is noted in the nondilated colon. Surgical clips are seen in epigastric region.  Nasogastric tube has been removed. IMPRESSION: Stable small bowel dilatation is noted concerning for distal small bowel obstruction or possibly ileus. Electronically Signed   By: Lupita Raider M.D.   On: 12/04/2021 10:35   [place] EKG SITE RITE  Result Date: 12/03/2021 If Site Rite image not attached, placement could not be confirmed due to current cardiac rhythm.       Scheduled Meds:  Chlorhexidine Gluconate Cloth  6 each Topical Daily   docusate  200 mg Oral BID   heparin injection (subcutaneous)  5,000 Units Subcutaneous Q8H   insulin aspart  0-9 Units Subcutaneous Q4H   levothyroxine  88 mcg Oral Q0600   pantoprazole (PROTONIX) IV  40 mg Intravenous Q12H   polyethylene glycol  17 g Oral BID   sodium chloride flush  10-40 mL Intracatheter Q12H   Continuous Infusions:  piperacillin-tazobactam (ZOSYN)  IV 3.375 g (12/04/21 0528)   potassium chloride 10 mEq (12/04/21 1005)   TPN ADULT (ION) 25 mL/hr at 12/04/21 0434   TPN ADULT (ION)       LOS: 6 days    Time spent: 35 minutes    Dorcas Carrow, MD [facility] Pager [phone]

## 2021-12-04 NOTE — Progress Notes (Signed)
   Subjective/Chief Complaint: Had a couple bms yesterday, no n/v, ng out, has taken some clears   Objective: Vital signs in last 24 hours: Temp:  [97.4 F (36.3 C)-97.9 F (36.6 C)] 97.9 F (36.6 C) (11/26 0304) Pulse Rate:  [75-91] 75 (11/26 0304) Resp:  [16-18] 16 (11/26 0304) BP: (112-132)/(77-99) 132/77 (11/26 0304) SpO2:  [93 %-94 %] 93 % (11/26 0304) Last BM Date : 12/03/21  Intake/Output from previous day: 11/25 0701 - 11/26 0700 In: 3491.6 [I.V.:1863.2; IV Piggyback:1628.4] Out: 400 [Urine:400] Intake/Output this shift: No intake/output data recorded.  Ab mild distended nontender  Lab Results:  Recent Labs    12/02/21 0257 12/03/21 0352  WBC 14.5* 13.2*  HGB 14.0 13.7  HCT 40.9 40.2  PLT 235 223   BMET Recent Labs    12/03/21 0352 12/04/21 0410  NA 143 143  K 3.7 3.1*  CL 107 105  CO2 28 27  GLUCOSE 122* 136*  BUN 26* 18  CREATININE 0.90 0.75  CALCIUM 8.7* 8.1*   PT/INR No results for input(s): "LABPROT", "INR" in the last 72 hours. ABG No results for input(s): "PHART", "HCO3" in the last 72 hours.  Invalid input(s): "PCO2", "PO2"  Studies/Results: [place] EKG SITE RITE  Result Date: 12/03/2021 If Site Rite image not attached, placement could not be confirmed due to current cardiac rhythm.  DG Abd 1 View  Result Date: 12/02/2021 CLINICAL DATA:  Nasogastric tube placement. EXAM: ABDOMEN - 1 VIEW COMPARISON:  Same day. FINDINGS: Nasogastric tube tip is seen in expected position of proximal stomach. Stable small bowel dilatation is noted concerning for ileus or partial small bowel obstruction. Stool is noted in nondilated colon. IMPRESSION: Nasogastric tube tip is seen in expected position of proximal stomach. Stable small bowel dilatation as described above. Electronically Signed   By: Lupita Raider M.D.   On: 12/02/2021 10:06    Anti-infectives: Anti-infectives (From admission, onward)    Start     Dose/Rate Route Frequency Ordered Stop    12/02/21 2200  piperacillin-tazobactam (ZOSYN) IVPB 3.375 g        3.375 g 12.5 mL/hr over 240 Minutes Intravenous Every 8 hours 12/02/21 1340     11/29/21 0600  piperacillin-tazobactam (ZOSYN) IVPB 2.25 g  Status:  Discontinued        2.25 g 100 mL/hr over 30 Minutes Intravenous Every 8 hours 11/17/2021 2342 12/02/21 1340   12/04/2021 2345  piperacillin-tazobactam (ZOSYN) IVPB 3.375 g  Status:  Discontinued        3.375 g 12.5 mL/hr over 240 Minutes Intravenous  Once 11/11/2021 2330 11/29/21 0003   12/07/2021 2115  piperacillin-tazobactam (ZOSYN) IVPB 3.375 g        3.375 g 100 mL/hr over 30 Minutes Intravenous  Once 11/20/2021 2113 11/09/2021 2345       Assessment/Plan: Chronic constipation, no BM in 10d PTA Small volume pneumoperitoneum on CT - Afebrile and  abdomen soft without peritonitis. No indication for emergency surgery at this time.  - will check follow up xray today, can continue clears and bowel regimen   FEN: clears ID: [email or web address]. WBC 37.6 > 36 > 19.2>13.2 VTE: SCDs, Heparin subq   Per primary: Dehydration AKI Anxiety/depression GERD HLD HTN Hypothyroidism IBS Turner syndrome hard of hearing  History of ascending and arch aneurysm with graft 2017 H/o prior abdominal surgery, at least surgery for ovarian cyst but she is unable to remember further surgical history   Kristi Erickson 12/04/2021

## 2021-12-05 ENCOUNTER — Inpatient Hospital Stay (HOSPITAL_COMMUNITY): Payer: Medicare Other

## 2021-12-05 DIAGNOSIS — K668 Other specified disorders of peritoneum: Secondary | ICD-10-CM | POA: Diagnosis not present

## 2021-12-05 LAB — GLUCOSE, CAPILLARY
Glucose-Capillary: 155 mg/dL — ABNORMAL HIGH (ref 70–99)
Glucose-Capillary: 156 mg/dL — ABNORMAL HIGH (ref 70–99)
Glucose-Capillary: 160 mg/dL — ABNORMAL HIGH (ref 70–99)
Glucose-Capillary: 182 mg/dL — ABNORMAL HIGH (ref 70–99)
Glucose-Capillary: 182 mg/dL — ABNORMAL HIGH (ref 70–99)
Glucose-Capillary: 190 mg/dL — ABNORMAL HIGH (ref 70–99)
Glucose-Capillary: 193 mg/dL — ABNORMAL HIGH (ref 70–99)

## 2021-12-05 LAB — COMPREHENSIVE METABOLIC PANEL
ALT: 31 U/L (ref 0–44)
AST: 30 U/L (ref 15–41)
Albumin: 1.9 g/dL — ABNORMAL LOW (ref 3.5–5.0)
Alkaline Phosphatase: 83 U/L (ref 38–126)
Anion gap: 6 (ref 5–15)
BUN: 21 mg/dL (ref 8–23)
CO2: 29 mmol/L (ref 22–32)
Calcium: 8.1 mg/dL — ABNORMAL LOW (ref 8.9–10.3)
Chloride: 106 mmol/L (ref 98–111)
Creatinine, Ser: 0.79 mg/dL (ref 0.44–1.00)
GFR, Estimated: >60 mL/min (ref >60)
Glucose, Bld: 152 mg/dL — ABNORMAL HIGH (ref 70–99)
Potassium: 3 mmol/L — ABNORMAL LOW (ref 3.5–5.1)
Sodium: 141 mmol/L (ref 135–145)
Total Bilirubin: 0.4 mg/dL (ref 0.3–1.2)
Total Protein: 4.8 g/dL — ABNORMAL LOW (ref 6.5–8.1)

## 2021-12-05 LAB — TRIGLYCERIDES: Triglycerides: 127 mg/dL (ref <150)

## 2021-12-05 LAB — MAGNESIUM: Magnesium: 1.8 mg/dL (ref 1.7–2.4)

## 2021-12-05 LAB — HEMOGLOBIN A1C
Hgb A1c MFr Bld: 5.9 % — ABNORMAL HIGH (ref 4.8–5.6)
Mean Plasma Glucose: 123 mg/dL

## 2021-12-05 LAB — PHOSPHORUS: Phosphorus: 2.7 mg/dL (ref 2.5–4.6)

## 2021-12-05 MED ORDER — TRAVASOL 10 % IV SOLN
INTRAVENOUS | Status: AC
  Filled 2021-12-05: qty 653.4

## 2021-12-05 MED ORDER — MAGNESIUM SULFATE 2 GM/50ML IV SOLN
2.0000 g | Freq: Once | INTRAVENOUS | Status: AC
  Administered 2021-12-05: 2 g via INTRAVENOUS
  Filled 2021-12-05: qty 50

## 2021-12-05 MED ORDER — POTASSIUM CHLORIDE 10 MEQ/50ML IV SOLN
10.0000 meq | INTRAVENOUS | Status: AC
  Administered 2021-12-05 (×4): 10 meq via INTRAVENOUS
  Filled 2021-12-05 (×4): qty 50

## 2021-12-05 NOTE — Progress Notes (Signed)
PROGRESS NOTE    Kristi Erickson  XKG:[id number] DOB: 09/08/1940 DOA: 12/01/2021 PCP: Juanda Bond    Brief Narrative:  81 year old with history of chronic constipation, anxiety, depression, GERD, hypertension hyperlipidemia and hypothyroidism, Turner syndrome and hard of hearing presented with not having bowel movement for more than 10 days, not eating, abdominal pain, distention and discomfort.  In the emergency room white cell count was 37.6.  CT scan abdomen showed a small pneumoperitoneum concerning for bowel perforation.  Admitted with surgical consultation. Remained in the hospital with poor bowel function recovery. 11/26, patient was started on clear liquid diet 11/27, she again has poor bowel sounds and recurrence of distention.  Assessment & Plan:   Pneumoperitoneum with bowel perforation Severe constipation.  Persistent ileus in the small bowel dilatation.  CT without contrast on presentation 11/20 with a small pneumoperitoneum, sigmoid diverticulosis.  WBC count 38,000. Bowel functions were very slow to improve.  Small amount of bowel movements with laxatives.   Restarted on clear liquid diet, continues to have recurrence of abdominal distention. Repeat KUB with persistent dilated small bowel loops. Will order CT scan with oral and IV contrast today to rule out any ischemic bowel, persistent obstruction.  Iodinated contrast may also help as a laxative. Continue to mobilize.  Respiratory therapy and incentive spirometry. Prolonged n.p.o., currently on TPN that we will continue. Pharmacy managing TPN as well as electrolyte replacements. Continue Zosyn until clinical improvement.  Dehydration with hypernatremia and acute kidney injury: Due to poor oral intake and ACE inhibitors.  Adequately improved.  Hypokalemia: Replace with IV fluid.  TPN being managed by pharmacy.  Acute urinary retention: Needing frequent to straight cath.  Foley catheter was  removed.  Essential hypertension: Stable.  Continue to hold antihypertensives.    Hypothyroidism: TSH 6.3.  Resumed thyroxine.    Severe hypophosphatemia: Replace with TPN.   Nutrition Status: Nutrition Problem: Moderate Malnutrition Etiology: acute illness Signs/Symptoms: energy intake < or equal to 50% for > or equal to 5 days, moderate fat depletion, moderate muscle depletion Interventions: TPN Currently on TPN  Out of bed.  PT OT.  Chest physiotherapy.   DVT prophylaxis: heparin injection 5,000 Units Start: 11/29/21 1400   Code Status: DNR Family Communication: Brother on the phone. Disposition Plan: Status is: Inpatient Remains inpatient appropriate because: Active treatment in the hospital.  No oral intake.  On TPN. Patient will be reevaluated by PT OT.  She has to go to a skilled nursing facility for rehab before returning home due to severe frailty and debility developed from hospitalization.     Consultants:  General surgery  Procedures:  None  Antimicrobials:  Zosyn 11/20---   Subjective:  Patient seen and examined.  Overall poor historian.  She denies any complaints however her abdomen is more distended with hypoactive bowel sounds today.  No bowel movement since last 24 hours.  Nursing reported patient gurgling after taking oral intake.  Denies any nausea vomiting.    Objective: Vitals:   12/04/21 1703 12/04/21 1942 12/05/21 0428 12/05/21 0805  BP: 118/82 139/79 133/87 (!) 146/96  Pulse: (!) 101 99 94 (!) 101  Resp: 16 20 17 15   Temp:  98 F (36.7 C) 98.3 F (36.8 C) 98.3 F (36.8 C)  TempSrc:  Oral  Oral  SpO2: 94% 92% 90% 92%  Weight:      Height:        Intake/Output Summary (Last 24 hours) at 12/05/2021 1136 Last data filed at 12/05/2021  8527 Gross per 24 hour  Intake 622.51 ml  Output 770 ml  Net -147.49 ml   Filed Weights   11/29/21 0729 12/02/21 1648  Weight: 45 kg 50.6 kg    Examination:  General exam: Appears calm and  comfortable .  Frail and debilitated.  Cachectic. Respiratory system: Respiratory effort normal.  On room air.  Conducted upper airway sounds. Cardiovascular system: S1 & S2 heard, RRR.  Gastrointestinal system: Soft.  Distended but nontender.  Bowel sounds absent. Central nervous system: Alert and oriented. No focal neurological deficits.flat affect.  Generalized weakness. Extremities: Symmetric 5 x 5 power.  Generalized weakness.   Data Reviewed: I have personally reviewed following labs and imaging studies  CBC: Recent Labs  Lab 11/29/21 0350 11/30/21 0650 12/01/21 0907 12/02/21 0257 12/03/21 0352  WBC 36.0* 19.2* 19.1* 14.5* 13.2*  NEUTROABS 30.2*  --  17.0* 12.6* 11.1*  HGB 15.4* 15.2* 14.6 14.0 13.7  HCT 45.6 45.7 45.1 40.9 40.2  MCV 92.1 92.0 94.2 92.3 91.6  PLT 285 276 PLATELET CLUMPS NOTED ON SMEAR, UNABLE TO ESTIMATE 235 223   Basic Metabolic Panel: Recent Labs  Lab 12/01/21 0432 12/02/21 0636 12/03/21 0352 12/03/21 2018 12/04/21 0410 12/05/21 0500  NA 146* 142 143  --  143 141  K 4.3 3.3* 3.7  --  3.1* 3.0*  CL 109 104 107  --  105 106  CO2 24 27 28   --  27 29  GLUCOSE 120* 149* 122*  --  136* 152*  BUN 59* 35* 26*  --  18 21  CREATININE 1.24* 0.86 0.90  --  0.75 0.79  CALCIUM 7.9* 8.1* 8.7*  --  8.1* 8.1*  MG 2.8* 2.2 2.2  --  2.0 1.8  PHOS 2.2*  --  <1.0* 2.4* 4.4 2.7   GFR: Estimated Creatinine Clearance: 38.3 mL/min (by C-G formula based on SCr of 0.79 mg/dL). Liver Function Tests: Recent Labs  Lab 11/29/21 0530 11/29/21 0649 12/01/21 0432 12/03/21 0352 12/05/21 0500  AST 15 20 17  35 30  ALT 12 17 17 26 31   ALKPHOS 42 59 60 70 83  BILITOT 0.7 1.0 1.0 0.5 0.4  PROT 3.5* 5.4* 5.2* 4.8* 4.8*  ALBUMIN 1.6* 2.5* 2.3* 2.0* 1.9*   Recent Labs  Lab 12/22/21 1800  LIPASE 26   No results for input(s): "AMMONIA" in the last 168 hours. Coagulation Profile: No results for input(s): "INR", "PROTIME" in the last 168 hours. Cardiac Enzymes: No  results for input(s): "CKTOTAL", "CKMB", "CKMBINDEX", "TROPONINI" in the last 168 hours. BNP (last 3 results) No results for input(s): "PROBNP" in the last 8760 hours. HbA1C: Recent Labs    12/03/21 1223  HGBA1C 5.9*   CBG: Recent Labs  Lab 12/04/21 1218 12/04/21 1608 12/05/21 0013 12/05/21 0441 12/05/21 0802  GLUCAP 143* 173* 182* 156* 160*   Lipid Profile: Recent Labs    12/03/21 0352 12/05/21 0500  TRIG 159* 127   Thyroid Function Tests: No results for input(s): "TSH", "T4TOTAL", "FREET4", "T3FREE", "THYROIDAB" in the last 72 hours.  Anemia Panel: No results for input(s): "VITAMINB12", "FOLATE", "FERRITIN", "TIBC", "IRON", "RETICCTPCT" in the last 72 hours. Sepsis Labs: Recent Labs  Lab 12/22/21 2130 11/29/21 1639  LATICACIDVEN 1.9 1.3    Recent Results (from the past 240 hour(s))  Blood culture (routine x 2)     Status: None   Collection Time: 12-22-2021  8:00 PM   Specimen: BLOOD LEFT HAND  Result Value Ref Range Status   Specimen Description  BLOOD LEFT HAND  Final   Special Requests   Final    BOTTLES DRAWN AEROBIC AND ANAEROBIC Blood Culture adequate volume   Culture   Final    NO GROWTH 5 DAYS Performed at [facility], [address] [place], [place] [address]    Report Status 12/03/2021 FINAL  Final  Blood culture (routine x 2)     Status: None   Collection Time: December 26, 2021  8:00 PM   Specimen: BLOOD  Result Value Ref Range Status   Specimen Description BLOOD RIGHT ANTECUBITAL  Final   Special Requests   Final    BOTTLES DRAWN AEROBIC AND ANAEROBIC Blood Culture adequate volume   Culture   Final    NO GROWTH 5 DAYS Performed at [facility], [address], [place], [place] [address]    Report Status 12/03/2021 FINAL  Final         Radiology Studies: DG Abd Portable 1V  Result Date: 12/05/2021 CLINICAL DATA:  Small-bowel obstruction EXAM: PORTABLE ABDOMEN - 1 VIEW COMPARISON:  Radiograph 12/04/2021 FINDINGS: Persistent small  bowel dilation, measuring up to 5.4 cm in, similar prior. Surgical clips overlie the mid abdomen. Multilevel degenerative changes of spine. IMPRESSION: Persistent small bowel dilation concerning for small bowel obstruction. Electronically Signed   By: Caprice Renshaw M.D.   On: 12/05/2021 10:43   DG Abd Portable 1V  Result Date: 12/04/2021 CLINICAL DATA:  Ileus. EXAM: PORTABLE ABDOMEN - 1 VIEW COMPARISON:  December 02, 2021. FINDINGS: Stable small bowel dilatation is noted concerning for distal small obstruction or possibly ileus. Stool is noted in the nondilated colon. Surgical clips are seen in epigastric region. Nasogastric tube has been removed. IMPRESSION: Stable small bowel dilatation is noted concerning for distal small bowel obstruction or possibly ileus. Electronically Signed   By: Lupita Raider M.D.   On: 12/04/2021 10:35        Scheduled Meds:  Chlorhexidine Gluconate Cloth  6 each Topical Daily   docusate  200 mg Oral BID   heparin injection (subcutaneous)  5,000 Units Subcutaneous Q8H   insulin aspart  0-9 Units Subcutaneous Q4H   levothyroxine  88 mcg Oral Q0600   pantoprazole (PROTONIX) IV  40 mg Intravenous Q12H   polyethylene glycol  17 g Oral BID   sodium chloride flush  10-40 mL Intracatheter Q12H   Continuous Infusions:  magnesium sulfate bolus IVPB     piperacillin-tazobactam (ZOSYN)  IV 3.375 g (12/05/21 0519)   potassium chloride 10 mEq (12/05/21 1039)   TPN ADULT (ION) 55 mL/hr at 12/04/21 1853   TPN ADULT (ION)       LOS: 7 days    Time spent: 35 minutes    Dorcas Carrow, MD [facility] Pager [phone]

## 2021-12-05 NOTE — Evaluation (Signed)
Clinical/Bedside Swallow Evaluation Patient Details  Name: Kristi Erickson MRN: [id number] Date of Birth: 10-05-1940  Today's Date: 12/05/2021 Time: SLP Start Time (ACUTE ONLY): 1220 SLP Stop Time (ACUTE ONLY): 1245 SLP Time Calculation (min) (ACUTE ONLY): 25 min  Past Medical History:  Past Medical History:  Diagnosis Date   Acute blood loss anemia    Anxiety    Ascending aortic aneurysm (HCC)    Depression    Diverticulitis    GERD (gastroesophageal reflux disease)    Glaucoma    Heart murmur    History of hiatal hernia    Hyperlipidemia    Hypertension    Hypokalemia    Hypothyroidism    IBS (irritable bowel syndrome)    Insomnia    Leukocytosis    Osteoporosis    Physical deconditioning    Stomach problems    Turner's syndrome    Vision disorder    Vitamin D deficiency    Past Surgical History:  Past Surgical History:  Procedure Laterality Date   ABDOMINAL SURGERY  1995   1970s  cyst from abdomen removed   APPENDECTOMY     CARDIAC CATHETERIZATION N/A 06/03/2015   Procedure: Left Heart Cath and Coronary Angiography;  Surgeon: Corky Crafts, MD;  Location: [facility] INVASIVE CV LAB;  Service: Cardiovascular;  Laterality: N/A;   CATARACT EXTRACTION W/ INTRAOCULAR LENS  IMPLANT, BILATERAL     EYE SURGERY     left eye muscle     age 37   GLAUCOMA VALVE INSERTION     right eye   OTHER SURGICAL HISTORY  2010    ear surgery   REPLACEMENT ASCENDING AORTA N/A 06/28/2015   Procedure: REPLACEMENT ASCENDING AORTA AND ARCH;  Surgeon: Loreli Slot, MD;  Location: [facility] OR;  Service: Open Heart Surgery;  Laterality: N/A;   TEE WITHOUT CARDIOVERSION N/A 06/28/2015   Procedure: TRANSESOPHAGEAL ECHOCARDIOGRAM (TEE);  Surgeon: Loreli Slot, MD;  Location: [facility] OR;  Service: Open Heart Surgery;  Laterality: N/A;   TONSILLECTOMY     t+a   TRANSTHORACIC ECHOCARDIOGRAM  08/2011   EF=/> 55%, LA mildly dilated, midl mitral annular calcification, trace MR, trace TR, mild AV  calcification w/mod regurg, trace pulm valve regurg   HPI:  81yo female admitted 11/10/2021 with abdominal pain related to chronic constipation (no BM in 10 days). PMH: acute blood loss anemia, anxiety, AAA, depression, diverticulitis, GERD, hiatal hernia, HLD, HTN, hypokalemia, hypothyroidism, IBS, insomnia, Leukocytosis, Stomach problems, Turner's syndrome.    Assessment / Plan / Recommendation  Clinical Impression  Pt was seen at bedside to assess swallow function and safety. Pt was resting in bed, no family present. She presents with full dentition, and CN exam is unremarkable. Pt does not report a history of swallowing difficulty, but does have esophageal issues (GERD and hiatal hernia). Pt accepted trials of ice chips, thin liquid, puree, and solid textures. She exhibited a congested nonproductive cough x2 during this assessment, which was not timed with PO presentation. At this time, recommend advancing diet to Dys2/thin liquids, primarily for energy conservation. SLP will follow to assess diet tolerance and determine readiness for advanced trials. RN and MD informed. SLP Visit Diagnosis: Dysphagia, unspecified (R13.10)    Aspiration Risk  Mild aspiration risk    Diet Recommendation Dysphagia 2 (Fine chop);Thin liquid   Liquid Administration via: Cup;Straw Medication Administration: Whole meds with puree Supervision: Patient able to self feed;Staff to assist with self feeding Compensations: Minimize environmental distractions;Small sips/bites;Slow rate Postural Changes: Seated  upright at 90 degrees;Remain upright for at least 30 minutes after po intake    Other  Recommendations Oral Care Recommendations: Oral care BID    Recommendations for follow up therapy are one component of a multi-disciplinary discharge planning process, led by the attending physician.  Recommendations may be updated based on patient status, additional functional criteria and insurance authorization.  Follow up  Recommendations Other (comment) (TBD)      Assistance Recommended at Discharge TBD  Functional Status Assessment Patient has had a recent decline in their functional status and/or demonstrates limited ability to make significant improvements in function in a reasonable and predictable amount of time  Frequency and Duration min 1 x/week  2 weeks;1 week       Prognosis Prognosis for Safe Diet Advancement: Good      Swallow Study   General Date of Onset: 11/23/2021 HPI: 81yo female admitted 11/09/2021 with abdominal pain related to chronic constipation (no BM in 10 days). PMH: acute blood loss anemia, anxiety, AAA, depression, diverticulitis, GERD, hiatal hernia, HLD, HTN, hypokalemia, hypothyroidism, IBS, insomnia, Leukocytosis, Stomach problems, Turner's syndrome. Type of Study: Bedside Swallow Evaluation Previous Swallow Assessment: none Diet Prior to this Study: Other (Comment) (clear liquids) Temperature Spikes Noted: No Respiratory Status: Room air History of Recent Intubation: No Behavior/Cognition: Alert Oral Cavity Assessment: Dry Oral Care Completed by SLP: Yes Oral Cavity - Dentition: Adequate natural dentition Vision: Functional for self-feeding Self-Feeding Abilities: Able to feed self Patient Positioning: Upright in bed Baseline Vocal Quality: Normal Volitional Cough: Weak;Congested Volitional Swallow: Able to elicit    Oral/Motor/Sensory Function Overall Oral Motor/Sensory Function: Generalized oral weakness   Ice Chips Ice chips: Within functional limits Presentation: Spoon   Thin Liquid Thin Liquid: Within functional limits Presentation: Straw    Nectar Thick Nectar Thick Liquid: Not tested   Honey Thick Honey Thick Liquid: Not tested   Puree Puree: Within functional limits Presentation: Spoon   Solid     Solid: Within functional limits Presentation: Self Fed     Burwell Bethel B. Murvin Natal, [facility], CCC-SLP Speech Language Pathologist Office: [phone]  Leigh Aurora 12/05/2021,1:16 PM

## 2021-12-05 NOTE — Progress Notes (Signed)
Occupational Therapy Treatment Patient Details Name: Kristi Erickson MRN: [id number] DOB: 1940-01-15 Today's Date: 12/05/2021   History of present illness 81 yo female admitted 11/20 with pneumoperitoneum. PMhx; AKI, HLD, HTN, IBS, GERD, HOH, Turner syndrome   OT comments  Pt making slow progress toward goals of mod I so pt can return home to live alone. Feel pt has become weaker in the hospital and continues to need at least min assist with adls and toileting is requiring mod assist. Feel pt may need SNF stay prior to returning home at this point.  Pt is not able to get OOB without assist and cannot complete basic adls with out assist. Will continue to encourage to do for herself and participate in more therapy with ultimate goal of eventually ending up at home.   Recommendations for follow up therapy are one component of a multi-disciplinary discharge planning process, led by the attending physician.  Recommendations may be updated based on patient status, additional functional criteria and insurance authorization.    Follow Up Recommendations  Skilled nursing-short term rehab (<3 hours/day)     Assistance Recommended at Discharge Frequent or constant Supervision/Assistance  Patient can return home with the following  A little help with walking and/or transfers;A little help with bathing/dressing/bathroom;Assistance with cooking/housework;Assist for transportation   Equipment Recommendations  BSC/3in1    Recommendations for Other Services      Precautions / Restrictions Precautions Precautions: Fall Restrictions Weight Bearing Restrictions: No       Mobility Bed Mobility Overal bed mobility: Needs Assistance Bed Mobility: Supine to Sit, Sit to Supine     Supine to sit: Min assist Sit to supine: Min assist   General bed mobility comments: encouargement to do this task on own. Pt has become accustomed to pulling up on someone to get up.  Encouraged pt to do for herself.     Transfers Overall transfer level: Needs assistance Equipment used: Rolling walker (2 wheels) Transfers: Sit to/from Stand Sit to Stand: Min assist           General transfer comment: Pt pulls up on walker bc used to using a rollator at home.  Therapist held walk for pt to pull up on     Balance Overall balance assessment: Needs assistance Sitting-balance support: No upper extremity supported, Feet supported Sitting balance-Leahy Scale: Fair Sitting balance - Comments: min guard Postural control: Posterior lean Standing balance support: Reliant on assistive device for balance, Bilateral upper extremity supported Standing balance-Leahy Scale: Poor Standing balance comment: min assist and RW for balance, cues for upright posture                           ADL either performed or assessed with clinical judgement   ADL Overall ADL's : Needs assistance/impaired Eating/Feeding: Modified independent;Bed level   Grooming: Sitting;Min guard           Upper Body Dressing : Min guard;Sitting   Lower Body Dressing: Minimal assistance;Sit to/from stand;Cueing for compensatory techniques Lower Body Dressing Details (indicate cue type and reason): min assist to power up to feet. Pt with chronic diarrhea. Depends acquired for this treatment session. Pt has chronic leakage making toileting an issue each time she gets up. Toilet Transfer: Minimal assistance;Stand-pivot;BSC/3in1   Psychiatrist and Hygiene: Moderate assistance;Sit to/from stand;Cueing for compensatory techniques Toileting - Clothing Manipulation Details (indicate cue type and reason): Pt must have depends on at all times when OOB making toileting  more difficult when pt soils depends.     Functional mobility during ADLs: Minimal assistance;Rolling walker (2 wheels) General ADL Comments: Pt limited by general fatigue. Pt required a great amount of encouragement to participate today. Pt has  gotten weaker while in hospital and does live alone.    Extremity/Trunk Assessment Upper Extremity Assessment Upper Extremity Assessment: Overall WFL for tasks assessed   Lower Extremity Assessment Lower Extremity Assessment: Defer to PT evaluation        Vision   Vision Assessment?: No apparent visual deficits   Perception Perception Perception: Within Functional Limits   Praxis Praxis Praxis: Intact    Cognition Arousal/Alertness: Awake/alert Behavior During Therapy: Flat affect Overall Cognitive Status: No family/caregiver present to determine baseline cognitive functioning Area of Impairment: Safety/judgement, Awareness                       Following Commands: Follows one step commands with increased time, Follows one step commands consistently Safety/Judgement: Decreased awareness of safety, Decreased awareness of deficits Awareness: Emergent   General Comments: easily aroused; oriented person, place, situation (time NT)        Exercises      Shoulder Instructions       General Comments Pt has become weaker while here in hospital.  Feel pt may need SNF before returning home alone    Pertinent Vitals/ Pain       Pain Assessment Pain Assessment: No/denies pain  Home Living                                          Prior Functioning/Environment              Frequency  Min 2X/week        Progress Toward Goals  OT Goals(current goals can now be found in the care plan section)  Progress towards OT goals: Progressing toward goals  Acute Rehab OT Goals Patient Stated Goal: to get stronger OT Goal Formulation: With patient Time For Goal Achievement: 12/15/21 Potential to Achieve Goals: Good ADL Goals Pt Will Perform Grooming: with modified independence;standing Pt Will Perform Lower Body Dressing: with modified independence;sit to/from stand Pt Will Transfer to Toilet: with modified independence;ambulating;regular  height toilet Additional ADL Goal #1: Pt will follow three step command during ADL with no more than 2 verbal cues  Plan Discharge plan needs to be updated    Co-evaluation                 AM-PAC OT "6 Clicks" Daily Activity     Outcome Measure   Help from another person eating meals?: None Help from another person taking care of personal grooming?: A Little Help from another person toileting, which includes using toliet, bedpan, or urinal?: A Lot Help from another person bathing (including washing, rinsing, drying)?: A Little Help from another person to put on and taking off regular upper body clothing?: A Little Help from another person to put on and taking off regular lower body clothing?: A Little 6 Click Score: 18    End of Session Equipment Utilized During Treatment: Rolling walker (2 wheels)  OT Visit Diagnosis: Unsteadiness on feet (R26.81);Muscle weakness (generalized) (M62.81)   Activity Tolerance Patient limited by fatigue   Patient Left in bed;with call bell/phone within reach;with bed alarm set   Nurse Communication Mobility status  Time: 1950-9326 OT Time Calculation (min): 15 min  Charges: OT General Charges $OT Visit: 1 Visit OT Treatments $Self Care/Home Management : 8-22 mins   Hope Budds 12/05/2021, 11:40 AM

## 2021-12-05 NOTE — Plan of Care (Addendum)
0700:Pt resting in bed at beginning of shift, pt alert and oriented x3 pt forgetful,  pt on room air, pt belly distended, Pt bed in lowest position, bed alarm in place.  RN notified Dr.Ghimire pt coughing when swallowing and "gurgling" sounds at throat area after swallowing, orders for SLP placed.  Pt seen by SLP during shift, Pt eating very little during shift only a couple bites and few sips, pt refusing to eat much, pt on TPN.   11:48: RN noticed that pt back rash looking worse today than a few days ago, back area having large red patches over most of back, RN notified Dr.Ghimire, per provider he will come to assess when he comes to the unit, RN later also notified MD that pt having rash to the front chest area as well, but rash on chest/front not as large as the back area. Pt rarely complaining of itchiness, pt resting in bed.   Pt voiding small amounts (1 unmeasured occurrence, and 100 ml x1, during shift, around 1630 pt BS showing , but around 1730 pt voided a medium sized occurrence  per NA.

## 2021-12-05 NOTE — Progress Notes (Signed)
Subjective/Chief Complaint: Had small BM yesterday and taking some CLD, but feels more distended.   Objective: Vital signs in last 24 hours: Temp:  [98 F (36.7 C)-98.3 F (36.8 C)] 98.3 F (36.8 C) (11/27 0805) Pulse Rate:  [94-101] 101 (11/27 0805) Resp:  [15-20] 15 (11/27 0805) BP: (118-146)/(79-96) 146/96 (11/27 0805) SpO2:  [90 %-94 %] 92 % (11/27 0805) Last BM Date : 12/03/21  Intake/Output from previous day: 11/26 0701 - 11/27 0700 In: 612.5 [I.V.:387.8; IV Piggyback:224.7] Out: 770 [Urine:770] Intake/Output this shift: Total I/O In: 10 [I.V.:10] Out: -   Abd: distended and tympanitic, minimally tender  Lab Results:  Recent Labs    12/03/21 0352  WBC 13.2*  HGB 13.7  HCT 40.2  PLT 223   BMET Recent Labs    12/04/21 0410 12/05/21 0500  NA 143 141  K 3.1* 3.0*  CL 105 106  CO2 27 29  GLUCOSE 136* 152*  BUN 18 21  CREATININE 0.75 0.79  CALCIUM 8.1* 8.1*   PT/INR No results for input(s): "LABPROT", "INR" in the last 72 hours. ABG No results for input(s): "PHART", "HCO3" in the last 72 hours.  Invalid input(s): "PCO2", "PO2"  Studies/Results: DG Abd Portable 1V  Result Date: 12/04/2021 CLINICAL DATA:  Ileus. EXAM: PORTABLE ABDOMEN - 1 VIEW COMPARISON:  December 02, 2021. FINDINGS: Stable small bowel dilatation is noted concerning for distal small obstruction or possibly ileus. Stool is noted in the nondilated colon. Surgical clips are seen in epigastric region. Nasogastric tube has been removed. IMPRESSION: Stable small bowel dilatation is noted concerning for distal small bowel obstruction or possibly ileus. Electronically Signed   By: Lupita Raider M.D.   On: 12/04/2021 10:35   [place] EKG SITE RITE  Result Date: 12/03/2021 If Site Rite image not attached, placement could not be confirmed due to current cardiac rhythm.   Anti-infectives: Anti-infectives (From admission, onward)    Start     Dose/Rate Route Frequency Ordered Stop    12/02/21 2200  piperacillin-tazobactam (ZOSYN) IVPB 3.375 g        3.375 g 12.5 mL/hr over 240 Minutes Intravenous Every 8 hours 12/02/21 1340     11/29/21 0600  piperacillin-tazobactam (ZOSYN) IVPB 2.25 g  Status:  Discontinued        2.25 g 100 mL/hr over 30 Minutes Intravenous Every 8 hours 12/02/2021 2342 12/02/21 1340   11/24/2021 2345  piperacillin-tazobactam (ZOSYN) IVPB 3.375 g  Status:  Discontinued        3.375 g 12.5 mL/hr over 240 Minutes Intravenous  Once 11/10/2021 2330 11/29/21 0003   11/30/2021 2115  piperacillin-tazobactam (ZOSYN) IVPB 3.375 g        3.375 g 100 mL/hr over 30 Minutes Intravenous  Once 11/20/2021 2113 11/18/2021 2345       Assessment/Plan: Chronic constipation, no BM in 10d PTA Small volume pneumoperitoneum on CT - plain film yesterday with some dilated small bowel still but minimal, but c/o increasing distention today and not taking in much of CLD.   -small BM yesterday -check plain film again today due to c/o worsening distention and tympany.   FEN: clears ID: zosyn. WBC 37.6 > 36 > 19.2>13.2 VTE: SCDs, Heparin subq   Per primary: Dehydration AKI Anxiety/depression GERD HLD HTN Hypothyroidism IBS Turner syndrome hard of hearing  History of ascending and arch aneurysm with graft 2017 H/o prior abdominal surgery, at least surgery for ovarian cyst but she is unable to remember further surgical history  Letha Cape 12/05/2021

## 2021-12-05 NOTE — NC FL2 (Signed)
[place] MEDICAID FL2 LEVEL OF CARE SCREENING TOOL     IDENTIFICATION  Patient Name: Kristi Erickson Birthdate: 1940/03/30 Sex: female Admission Date (Current Location): December 07, 2021  [facility] and [place] Number:  Producer, television/film/video and Address:  [facility] [place], [address], [place], [place] [address]      Provider Number: [id number]  Attending Physician Name and Address:  Dorcas Carrow, MD  Relative Name and Phone Number:  Saraih Lorton, brother - [phone]    Current Level of Care: Hospital Recommended Level of Care: Skilled Nursing Facility Prior Approval Number:    Date Approved/Denied:   PASRR Number: [id number]  Discharge Plan: SNF    Current Diagnoses: Patient Active Problem List   Diagnosis Date Noted   Malnutrition of moderate degree 11/30/2021   Pneumoperitoneum 12/07/2021   Hypokalemia 12/07/21   Bowel perforation (HCC) Dec 07, 2021   History of repair of thoracic aortic aneurysm 10/10/2017   Symptomatic bradycardia 10/10/2017   Bifascicular block 10/10/2017   Osteoporosis 12/25/2016   Preoperative cardiovascular examination    Ascending aortic aneurysm (HCC)    Aneurysm of ascending aorta (HCC) 05/19/2015   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   Turner's syndrome 05/19/2015   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   Thyroid nodule 05/19/2015   AI (aortic insufficiency) 03/31/2015   Essential hypertension 03/31/2015   Dyslipidemia 03/31/2015    Orientation RESPIRATION BLADDER Height & Weight     Self, Situation, Place  Normal Continent Weight: 50.6 kg Height:  4\' 11"  (149.9 cm)  BEHAVIORAL SYMPTOMS/MOOD NEUROLOGICAL BOWEL NUTRITION STATUS      Incontinent Diet (Clear Liquid diet at this time - see discharge summary)  AMBULATORY STATUS COMMUNICATION OF NEEDS Skin   Total Care Verbally Other (Comment) (redness on sacrum)                       Personal Care Assistance Level of Assistance  Bathing, Feeding, Dressing  Bathing Assistance: Limited assistance Feeding assistance: Limited assistance Dressing Assistance: Limited assistance     Functional Limitations Info  Sight, Hearing, Speech Sight Info: Impaired Hearing Info: Impaired Speech Info: Adequate    SPECIAL CARE FACTORS FREQUENCY  OT (By licensed OT), PT (By licensed PT)     PT Frequency: 3-5 x per week OT Frequency: 3-5 x per week            Contractures Contractures Info: Not present    Additional Factors Info  Code Status, Allergies, Insulin Sliding Scale, Psychotropic Code Status Info: DNR Allergies Info: Shellfish, Remeron   Insulin Sliding Scale Info: Novolog Sliding Scale Insulin - Sensitive sale q 4 hours       Current Medications (12/05/2021):  This is the current hospital active medication list Current Facility-Administered Medications  Medication Dose Route Frequency Provider Last Rate Last Admin   acetaminophen (TYLENOL) tablet 650 mg  650 mg Oral Q6H PRN Opyd, 12/07/2021, MD   650 mg at 12/05/21 0804   Chlorhexidine Gluconate Cloth 2 % PADS 6 each  6 each Topical Daily 12/07/21, MD   6 each at 12/05/21 0804   docusate (COLACE) 50 MG/5ML liquid 200 mg  200 mg Oral BID 12/07/21, MD   200 mg at 12/05/21 0803   heparin injection 5,000 Units  5,000 Units Subcutaneous Q8H Crosley, Debby, MD   5,000 Units at 12/05/21 1321   insulin aspart (novoLOG) injection 0-9 Units  0-9 Units Subcutaneous Q4H 12/07/21, RPH   2  Units at 12/05/21 1242   levothyroxine (SYNTHROID) tablet 88 mcg  88 mcg Oral Q0600 Dorcas Carrow, MD   88 mcg at 12/05/21 0518   morphine (PF) 2 MG/ML injection 1 mg  1 mg Intravenous Q4H PRN Gery Pray, MD   1 mg at 12/01/21 2323   Oral care mouth rinse  15 mL Mouth Rinse PRN Dorcas Carrow, MD       oxyCODONE (Oxy IR/ROXICODONE) immediate release tablet 2.5 mg  2.5 mg Oral Q4H PRN Opyd, Lavone Neri, MD   2.5 mg at 11/29/21 2313   pantoprazole (PROTONIX) injection 40 mg  40 mg  Intravenous Q12H Dorcas Carrow, MD   40 mg at 12/05/21 0802   piperacillin-tazobactam (ZOSYN) IVPB 3.375 g  3.375 g Intravenous Q8H Reome, Earle J, RPH 12.5 mL/hr at 12/05/21 1321 3.375 g at 12/05/21 1321   polyethylene glycol (MIRALAX / GLYCOLAX) packet 17 g  17 g Oral BID Diamantina Monks, MD   17 g at 12/05/21 0804   prochlorperazine (COMPAZINE) injection 5 mg  5 mg Intravenous Q6H PRN Dow Adolph N, DO   5 mg at 12/02/21 0527   sodium chloride flush (NS) 0.9 % injection 10-40 mL  10-40 mL Intracatheter Q12H Dorcas Carrow, MD   10 mL at 12/05/21 0802   sodium chloride flush (NS) 0.9 % injection 10-40 mL  10-40 mL Intracatheter PRN Dorcas Carrow, MD       TPN ADULT (ION)   Intravenous Continuous TPN Francena Hanly, RPH 55 mL/hr at 12/04/21 1853 Infusion Verify at 12/04/21 1853   TPN ADULT (ION)   Intravenous Continuous TPN Dorcas Carrow, MD         Discharge Medications: Please see discharge summary for a list of discharge medications.  Relevant Imaging Results:  Relevant Lab Results:   Additional Information SSN: [id number]  Janae Bridgeman, RN

## 2021-12-05 NOTE — Progress Notes (Signed)
PHARMACY - TOTAL PARENTERAL NUTRITION CONSULT NOTE   Indication:  prolonged NPO status due to nausea/vomiting in the setting of pneumoperitoneum and constipation   Patient Measurements: Height: _0  (149.9 cm) Weight: 50.6 kg (111 lb 8.8 oz) IBW/kg (Calculated) : 43.2 TPN AdjBW (KG): 45 Body mass index is 22.53 kg/m. Usual Weight: 48kg, ~ 105/106 lbs per patient   Assessment: Patient with PMH notable for aortic insufficiency, hypertension, HLD, hypothyroidism, Turner syndrome, horseshoe kidney presented with initial concern for a small bowel obstruction. Patient reports no bowel movement in 10 days. Throughout this time she has had poor oral intake saying she only ate 1-2 crackers per day prior to admission. Prior to this, patient had adequate oral intake stating she ate 3 meals per day. Average meal included protein, carbs, and some fat (for ex: breakfast: 1-2 eggs, sausage, and english muffin). General surgery consulted for pneumoperitoneum seen on CT scan.  Surgery recommending NPO status and medical management for now.   Of note, patient has shellfish allergy reported. Patient confirms allergy in the chart with reported reaction of nausea and vomiting. Patient reports that she has tolerated other fish besides oysters and scallops. No anaphylaxis reported in the past.   Glucose / Insulin: no history of diabetes noted, last A1C from several years ago 5.5%  Blood sugars controlled, 6u SSI/24h Electrolytes: phos: 2.7, K: 3, Na: 141, Mag: 1.8, CoCa: 9.7, HCO: 29, Cl: 106 Renal: Scr: 0.79, BUN: 21  Hepatic: AST/ALT WNL, Alk phos: 83, TG 127, albumin: 1.9, T-bili: 0.4 Intake / Output; MIVF: UOP: 0.10m/kg/hr, NG tube removed, MVIF stopped, LBM 11/26  GI Imaging: 11/20: CT abdomen, small pneumoperitoneum  11/26 Abd x-ray - Stable small bowel dilatation is noted concerning for distal small bowel obstruction or possibly ileus  GI Surgeries / Procedures: none   Central access: PICC line placed  11/25  TPN start date: 11/25   Nutritional Goals: Goal TPN rate is 55 mL/hr (provides 65 g of protein and 1350 kcals per day (30kcal/kg) Estimated Needs Total Energy Estimated Needs: 1350-1575 kcals Total Protein Estimated Needs: 65-80 gm Total Fluid Estimated Needs: >/= 1.3 L  Current Nutrition:  CLD  TPN   Plan:  Continue TPN to goal of 587mhr at 1800, will meet approximately 100% of nutritional needs providing 1350kcal and 65g AA. Will monitor closely as patient is high risk for refeeding syndrome.  Electrolytes in TPN: Na 5036mL, increase K to 39m41m, Ca 5mEq52m increase Mg 7mEq/79mand increase Phos 22mmol21mCl:Ac 1:2 given chloride borderline high.  Kcl 10 meq x 4 Mag 2 gms x 1 Add standard MVI and trace elements to TPN Initiate Sensitive q4h SSI at 1800 and adjust as needed  Monitor TPN labs on Mon/Thurs, daily until stabilization F/u diet advancement for TPN wean.   Cathy PAlanda SlimD, FCCM [facility] Pharmacist Please see AMION for all Pharmacists' Contact Phone Numbers 12/05/2021, 9:17 AM

## 2021-12-06 ENCOUNTER — Other Ambulatory Visit: Payer: Self-pay

## 2021-12-06 ENCOUNTER — Encounter (HOSPITAL_COMMUNITY): Payer: Self-pay | Admitting: Family Medicine

## 2021-12-06 ENCOUNTER — Inpatient Hospital Stay (HOSPITAL_COMMUNITY): Payer: Medicare Other

## 2021-12-06 ENCOUNTER — Inpatient Hospital Stay (HOSPITAL_COMMUNITY): Payer: Medicare Other | Admitting: Anesthesiology

## 2021-12-06 ENCOUNTER — Inpatient Hospital Stay: Payer: Self-pay

## 2021-12-06 ENCOUNTER — Encounter (HOSPITAL_COMMUNITY): Admission: EM | Disposition: E | Payer: Self-pay | Source: Home / Self Care | Attending: Internal Medicine

## 2021-12-06 DIAGNOSIS — I10 Essential (primary) hypertension: Secondary | ICD-10-CM | POA: Diagnosis not present

## 2021-12-06 DIAGNOSIS — R579 Shock, unspecified: Secondary | ICD-10-CM

## 2021-12-06 DIAGNOSIS — K631 Perforation of intestine (nontraumatic): Secondary | ICD-10-CM

## 2021-12-06 DIAGNOSIS — F418 Other specified anxiety disorders: Secondary | ICD-10-CM

## 2021-12-06 DIAGNOSIS — Z87891 Personal history of nicotine dependence: Secondary | ICD-10-CM

## 2021-12-06 DIAGNOSIS — K668 Other specified disorders of peritoneum: Secondary | ICD-10-CM

## 2021-12-06 DIAGNOSIS — J9600 Acute respiratory failure, unspecified whether with hypoxia or hypercapnia: Secondary | ICD-10-CM

## 2021-12-06 HISTORY — PX: LAPAROSCOPY: SHX197

## 2021-12-06 HISTORY — PX: LAPAROTOMY: SHX154

## 2021-12-06 LAB — BASIC METABOLIC PANEL
Anion gap: 10 (ref 5–15)
Anion gap: 9 (ref 5–15)
BUN: 30 mg/dL — ABNORMAL HIGH (ref 8–23)
BUN: 33 mg/dL — ABNORMAL HIGH (ref 8–23)
CO2: 25 mmol/L (ref 22–32)
CO2: 19 mmol/L — ABNORMAL LOW (ref 22–32)
Calcium: 8.1 mg/dL — ABNORMAL LOW (ref 8.9–10.3)
Calcium: 6.2 mg/dL — CL (ref 8.9–10.3)
Chloride: 102 mmol/L (ref 98–111)
Chloride: 109 mmol/L (ref 98–111)
Creatinine, Ser: 0.73 mg/dL (ref 0.44–1.00)
Creatinine, Ser: 0.64 mg/dL (ref 0.44–1.00)
GFR, Estimated: >60 mL/min (ref >60)
GFR, Estimated: >60 mL/min (ref >60)
Glucose, Bld: 149 mg/dL — ABNORMAL HIGH (ref 70–99)
Glucose, Bld: 173 mg/dL — ABNORMAL HIGH (ref 70–99)
Potassium: 3.4 mmol/L — ABNORMAL LOW (ref 3.5–5.1)
Potassium: 2.8 mmol/L — ABNORMAL LOW (ref 3.5–5.1)
Sodium: 137 mmol/L (ref 135–145)
Sodium: 137 mmol/L (ref 135–145)

## 2021-12-06 LAB — CBC WITH DIFFERENTIAL/PLATELET
Abs Immature Granulocytes: 0.38 10*3/uL — ABNORMAL HIGH (ref 0.00–0.07)
Abs Immature Granulocytes: 0.12 10*3/uL — ABNORMAL HIGH (ref 0.00–0.07)
Basophils Absolute: 0.1 10*3/uL (ref 0.0–0.1)
Basophils Absolute: 0 10*3/uL (ref 0.0–0.1)
Basophils Relative: 0 %
Basophils Relative: 1 %
Eosinophils Absolute: 0.1 10*3/uL (ref 0.0–0.5)
Eosinophils Absolute: 0.1 10*3/uL (ref 0.0–0.5)
Eosinophils Relative: 1 %
Eosinophils Relative: 1 %
HCT: 39.7 % (ref 36.0–46.0)
HCT: 33.8 % — ABNORMAL LOW (ref 36.0–46.0)
Hemoglobin: 13.2 g/dL (ref 12.0–15.0)
Hemoglobin: 10.8 g/dL — ABNORMAL LOW (ref 12.0–15.0)
Immature Granulocytes: 2 %
Immature Granulocytes: 3 %
Lymphocytes Relative: 2 %
Lymphocytes Relative: 8 %
Lymphs Abs: 0.4 10*3/uL — ABNORMAL LOW (ref 0.7–4.0)
Lymphs Abs: 0.3 10*3/uL — ABNORMAL LOW (ref 0.7–4.0)
MCH: 30.9 pg (ref 26.0–34.0)
MCH: 30.4 pg (ref 26.0–34.0)
MCHC: 33.2 g/dL (ref 30.0–36.0)
MCHC: 32 g/dL (ref 30.0–36.0)
MCV: 93 fL (ref 80.0–100.0)
MCV: 95.2 fL (ref 80.0–100.0)
Monocytes Absolute: 1.1 10*3/uL — ABNORMAL HIGH (ref 0.1–1.0)
Monocytes Absolute: 0.2 10*3/uL (ref 0.1–1.0)
Monocytes Relative: 5 %
Monocytes Relative: 5 %
Neutro Abs: 18.8 10*3/uL — ABNORMAL HIGH (ref 1.7–7.7)
Neutro Abs: 3.3 10*3/uL (ref 1.7–7.7)
Neutrophils Relative %: 90 %
Neutrophils Relative %: 82 %
Platelets: 173 10*3/uL (ref 150–400)
Platelets: 147 10*3/uL — ABNORMAL LOW (ref 150–400)
RBC: 4.27 MIL/uL (ref 3.87–5.11)
RBC: 3.55 MIL/uL — ABNORMAL LOW (ref 3.87–5.11)
RDW: 13.1 % (ref 11.5–15.5)
RDW: 13.2 % (ref 11.5–15.5)
WBC: 21 10*3/uL — ABNORMAL HIGH (ref 4.0–10.5)
WBC: 4 10*3/uL (ref 4.0–10.5)
nRBC: 0 % (ref 0.0–0.2)
nRBC: 0.5 % — ABNORMAL HIGH (ref 0.0–0.2)

## 2021-12-06 LAB — POCT I-STAT 7, (LYTES, BLD GAS, ICA,H+H)
Acid-base deficit: 4 mmol/L — ABNORMAL HIGH (ref 0.0–2.0)
Bicarbonate: 21.3 mmol/L (ref 20.0–28.0)
Calcium, Ion: 1.1 mmol/L — ABNORMAL LOW (ref 1.15–1.40)
HCT: 35 % — ABNORMAL LOW (ref 36.0–46.0)
Hemoglobin: 11.9 g/dL — ABNORMAL LOW (ref 12.0–15.0)
O2 Saturation: 100 %
Patient temperature: 98.6
Potassium: 3.7 mmol/L (ref 3.5–5.1)
Sodium: 136 mmol/L (ref 135–145)
TCO2: 22 mmol/L (ref 22–32)
pCO2 arterial: 39.2 mmHg (ref 32–48)
pH, Arterial: 7.343 — ABNORMAL LOW (ref 7.35–7.45)
pO2, Arterial: 199 mmHg — ABNORMAL HIGH (ref 83–108)

## 2021-12-06 LAB — GLUCOSE, CAPILLARY
Glucose-Capillary: 165 mg/dL — ABNORMAL HIGH (ref 70–99)
Glucose-Capillary: 156 mg/dL — ABNORMAL HIGH (ref 70–99)
Glucose-Capillary: 171 mg/dL — ABNORMAL HIGH (ref 70–99)
Glucose-Capillary: 152 mg/dL — ABNORMAL HIGH (ref 70–99)
Glucose-Capillary: 154 mg/dL — ABNORMAL HIGH (ref 70–99)
Glucose-Capillary: 162 mg/dL — ABNORMAL HIGH (ref 70–99)

## 2021-12-06 LAB — MAGNESIUM: Magnesium: 2.2 mg/dL (ref 1.7–2.4)

## 2021-12-06 LAB — LACTIC ACID, PLASMA: Lactic Acid, Venous: 2.5 mmol/L (ref 0.5–1.9)

## 2021-12-06 LAB — PHOSPHORUS: Phosphorus: 2.7 mg/dL (ref 2.5–4.6)

## 2021-12-06 SURGERY — LAPAROSCOPY, DIAGNOSTIC
Anesthesia: General | Site: Abdomen

## 2021-12-06 MED ORDER — LIDOCAINE 2% (20 MG/ML) 5 ML SYRINGE
INTRAMUSCULAR | Status: DC | PRN
  Administered 2021-12-06: 40 mg via INTRAVENOUS

## 2021-12-06 MED ORDER — PHENYLEPHRINE 80 MCG/ML (10ML) SYRINGE FOR IV PUSH (FOR BLOOD PRESSURE SUPPORT)
PREFILLED_SYRINGE | INTRAVENOUS | Status: AC
  Filled 2021-12-06: qty 10

## 2021-12-06 MED ORDER — EPHEDRINE SULFATE-NACL 50-0.9 MG/10ML-% IV SOSY
PREFILLED_SYRINGE | INTRAVENOUS | Status: DC | PRN
  Administered 2021-12-06 (×3): 5 mg via INTRAVENOUS

## 2021-12-06 MED ORDER — ORAL CARE MOUTH RINSE: 15.0000 mL | Freq: Once | OROMUCOSAL | Status: AC

## 2021-12-06 MED ORDER — SODIUM CHLORIDE 0.9 % IV SOLN
2.0000 g | Freq: Two times a day (BID) | INTRAVENOUS | Status: DC
  Administered 2021-12-06 – 2021-12-07 (×2): 2 g via INTRAVENOUS
  Filled 2021-12-06 (×3): qty 12.5

## 2021-12-06 MED ORDER — BUPIVACAINE-EPINEPHRINE (PF) 0.25% -1:200000 IJ SOLN
INTRAMUSCULAR | Status: DC | PRN
  Administered 2021-12-06: 2 mL

## 2021-12-06 MED ORDER — PROPOFOL 1000 MG/100ML IV EMUL
0.0000 ug/kg/min | INTRAVENOUS | Status: DC
  Filled 2021-12-06: qty 100

## 2021-12-06 MED ORDER — 0.9 % SODIUM CHLORIDE (POUR BTL) OPTIME
TOPICAL | Status: DC | PRN
  Administered 2021-12-06: 5000 mL

## 2021-12-06 MED ORDER — LEVOTHYROXINE SODIUM 100 MCG/5ML IV SOLN: 125.0000 ug | Freq: Every day | INTRAVENOUS | Status: DC

## 2021-12-06 MED ORDER — ROCURONIUM BROMIDE 10 MG/ML (PF) SYRINGE
PREFILLED_SYRINGE | INTRAVENOUS | Status: DC | PRN
  Administered 2021-12-06: 40 mg via INTRAVENOUS
  Administered 2021-12-06: 20 mg via INTRAVENOUS

## 2021-12-06 MED ORDER — DEXAMETHASONE SODIUM PHOSPHATE 10 MG/ML IJ SOLN
INTRAMUSCULAR | Status: DC | PRN
  Administered 2021-12-06: 5 mg via INTRAVENOUS

## 2021-12-06 MED ORDER — DEXAMETHASONE SODIUM PHOSPHATE 10 MG/ML IJ SOLN
INTRAMUSCULAR | Status: AC
  Filled 2021-12-06: qty 2

## 2021-12-06 MED ORDER — VANCOMYCIN HCL 1250 MG/250ML IV SOLN
1250.0000 mg | INTRAVENOUS | Status: DC
  Administered 2021-12-07: 1250 mg via INTRAVENOUS
  Filled 2021-12-06 (×2): qty 250

## 2021-12-06 MED ORDER — PHENYLEPHRINE 80 MCG/ML (10ML) SYRINGE FOR IV PUSH (FOR BLOOD PRESSURE SUPPORT)
PREFILLED_SYRINGE | INTRAVENOUS | Status: DC | PRN
  Administered 2021-12-06 (×4): 240 ug via INTRAVENOUS
  Administered 2021-12-06: 160 ug via INTRAVENOUS

## 2021-12-06 MED ORDER — ROCURONIUM BROMIDE 10 MG/ML (PF) SYRINGE
PREFILLED_SYRINGE | INTRAVENOUS | Status: AC
  Filled 2021-12-06: qty 10

## 2021-12-06 MED ORDER — NOREPINEPHRINE 4 MG/250ML-% IV SOLN
INTRAVENOUS | Status: DC | PRN
  Administered 2021-12-06: 2 ug/min via INTRAVENOUS

## 2021-12-06 MED ORDER — NOREPINEPHRINE 4 MG/250ML-% IV SOLN
0.0000 ug/min | INTRAVENOUS | Status: DC
  Administered 2021-12-06: 10 ug/min via INTRAVENOUS
  Administered 2021-12-07: 16 ug/min via INTRAVENOUS
  Administered 2021-12-07: 14 ug/min via INTRAVENOUS
  Administered 2021-12-07: 12 ug/min via INTRAVENOUS
  Administered 2021-12-07: 16 ug/min via INTRAVENOUS
  Administered 2021-12-08: 10 ug/min via INTRAVENOUS
  Administered 2021-12-08: 4 ug/min via INTRAVENOUS
  Administered 2021-12-09: 2 ug/min via INTRAVENOUS
  Filled 2021-12-06 (×9): qty 250

## 2021-12-06 MED ORDER — FENTANYL 2500MCG IN NS 250ML (10MCG/ML) PREMIX INFUSION
25.0000 ug/h | INTRAVENOUS | Status: DC
  Administered 2021-12-07: 75 ug/h via INTRAVENOUS
  Filled 2021-12-06: qty 250

## 2021-12-06 MED ORDER — METRONIDAZOLE 500 MG/100ML IV SOLN
500.0000 mg | Freq: Two times a day (BID) | INTRAVENOUS | Status: DC
  Administered 2021-12-06 – 2021-12-09 (×7): 500 mg via INTRAVENOUS
  Filled 2021-12-06 (×8): qty 100

## 2021-12-06 MED ORDER — LACTATED RINGERS IV BOLUS
1000.0000 mL | Freq: Once | INTRAVENOUS | Status: AC
  Administered 2021-12-06: 1000 mL via INTRAVENOUS

## 2021-12-06 MED ORDER — SUCCINYLCHOLINE CHLORIDE 200 MG/10ML IV SOSY
PREFILLED_SYRINGE | INTRAVENOUS | Status: DC | PRN
  Administered 2021-12-06: 80 mg via INTRAVENOUS

## 2021-12-06 MED ORDER — CHLORHEXIDINE GLUCONATE 0.12 % MT SOLN: 15.0000 mL | Freq: Once | OROMUCOSAL | Status: AC

## 2021-12-06 MED ORDER — CHLORHEXIDINE GLUCONATE 0.12 % MT SOLN
OROMUCOSAL | Status: AC
  Administered 2021-12-06: 15 mL via OROMUCOSAL
  Filled 2021-12-06: qty 15

## 2021-12-06 MED ORDER — PROPOFOL 10 MG/ML IV BOLUS
INTRAVENOUS | Status: DC | PRN
  Administered 2021-12-06: 100 mg via INTRAVENOUS

## 2021-12-06 MED ORDER — TRAVASOL 10 % IV SOLN
INTRAVENOUS | Status: AC
  Filled 2021-12-06: qty 653.4

## 2021-12-06 MED ORDER — SODIUM CHLORIDE 0.9 % IV BOLUS
500.0000 mL | Freq: Once | INTRAVENOUS | Status: AC
  Administered 2021-12-06: 500 mL via INTRAVENOUS

## 2021-12-06 MED ORDER — FENTANYL CITRATE PF 50 MCG/ML IJ SOSY
25.0000 ug | PREFILLED_SYRINGE | Freq: Once | INTRAMUSCULAR | Status: DC
  Filled 2021-12-06: qty 0.5

## 2021-12-06 MED ORDER — ONDANSETRON HCL 4 MG/2ML IJ SOLN
INTRAMUSCULAR | Status: AC
  Filled 2021-12-06: qty 4

## 2021-12-06 MED ORDER — PROPOFOL 500 MG/50ML IV EMUL
INTRAVENOUS | Status: DC | PRN
  Administered 2021-12-06: 30 ug/kg/min via INTRAVENOUS

## 2021-12-06 MED ORDER — FENTANYL 2500MCG IN NS 250ML (10MCG/ML) PREMIX INFUSION: 0.0000 ug/h | INTRAVENOUS | Status: DC

## 2021-12-06 MED ORDER — FENTANYL CITRATE (PF) 100 MCG/2ML IJ SOLN: 25.0000 ug | INTRAMUSCULAR | Status: DC | PRN

## 2021-12-06 MED ORDER — LACTATED RINGERS IV SOLN: INTRAVENOUS | Status: DC

## 2021-12-06 MED ORDER — NOREPINEPHRINE 4 MG/250ML-% IV SOLN
INTRAVENOUS | Status: AC
  Filled 2021-12-06: qty 250

## 2021-12-06 MED ORDER — ONDANSETRON HCL 4 MG/2ML IJ SOLN: 4.0000 mg | Freq: Four times a day (QID) | INTRAMUSCULAR | Status: DC | PRN

## 2021-12-06 MED ORDER — LIDOCAINE 2% (20 MG/ML) 5 ML SYRINGE
INTRAMUSCULAR | Status: AC
  Filled 2021-12-06: qty 5

## 2021-12-06 MED ORDER — PROPOFOL 10 MG/ML IV BOLUS
INTRAVENOUS | Status: AC
  Filled 2021-12-06: qty 20

## 2021-12-06 MED ORDER — FENTANYL CITRATE (PF) 250 MCG/5ML IJ SOLN
INTRAMUSCULAR | Status: DC | PRN
  Administered 2021-12-06 (×2): 50 ug via INTRAVENOUS

## 2021-12-06 MED ORDER — PHENYLEPHRINE HCL-NACL 20-0.9 MG/250ML-% IV SOLN
INTRAVENOUS | Status: DC | PRN
  Administered 2021-12-06: 50 ug/min via INTRAVENOUS

## 2021-12-06 MED ORDER — OXYCODONE HCL 5 MG/5ML PO SOLN: 5.0000 mg | Freq: Once | ORAL | Status: DC | PRN

## 2021-12-06 MED ORDER — ONDANSETRON HCL 4 MG/2ML IJ SOLN
INTRAMUSCULAR | Status: DC | PRN
  Administered 2021-12-06: 4 mg via INTRAVENOUS

## 2021-12-06 MED ORDER — CHLORHEXIDINE GLUCONATE CLOTH 2 % EX PADS: 6.0000 | MEDICATED_PAD | Freq: Once | CUTANEOUS | Status: DC

## 2021-12-06 MED ORDER — ACETAMINOPHEN 10 MG/ML IV SOLN
1000.0000 mg | Freq: Four times a day (QID) | INTRAVENOUS | Status: AC
  Administered 2021-12-06 – 2021-12-07 (×3): 1000 mg via INTRAVENOUS
  Filled 2021-12-06 (×3): qty 100

## 2021-12-06 MED ORDER — VASOPRESSIN 20 UNIT/ML IV SOLN
INTRAVENOUS | Status: AC
  Filled 2021-12-06: qty 1

## 2021-12-06 MED ORDER — OXYCODONE HCL 5 MG PO TABS: 5.0000 mg | ORAL_TABLET | Freq: Once | ORAL | Status: DC | PRN

## 2021-12-06 MED ORDER — POTASSIUM CHLORIDE 10 MEQ/50ML IV SOLN
10.0000 meq | INTRAVENOUS | Status: AC
  Administered 2021-12-06 (×2): 10 meq via INTRAVENOUS
  Filled 2021-12-06 (×2): qty 50

## 2021-12-06 MED ORDER — FENTANYL CITRATE (PF) 250 MCG/5ML IJ SOLN
INTRAMUSCULAR | Status: AC
  Filled 2021-12-06: qty 5

## 2021-12-06 MED ORDER — VASOPRESSIN 20 UNIT/ML IV SOLN
INTRAVENOUS | Status: DC | PRN
  Administered 2021-12-06 (×2): 2 [IU] via INTRAVENOUS
  Administered 2021-12-06: 1 [IU] via INTRAVENOUS

## 2021-12-06 MED ORDER — FENTANYL BOLUS VIA INFUSION
25.0000 ug | INTRAVENOUS | Status: DC | PRN
  Administered 2021-12-07: 50 ug via INTRAVENOUS
  Administered 2021-12-07: 25 ug via INTRAVENOUS
  Administered 2021-12-08 (×4): 50 ug via INTRAVENOUS

## 2021-12-06 MED ORDER — ALBUMIN HUMAN 5 % IV SOLN: INTRAVENOUS | Status: DC | PRN

## 2021-12-06 MED ORDER — SUCCINYLCHOLINE CHLORIDE 200 MG/10ML IV SOSY
PREFILLED_SYRINGE | INTRAVENOUS | Status: AC
  Filled 2021-12-06: qty 10

## 2021-12-06 MED ORDER — FENTANYL 2500MCG IN NS 250ML (10MCG/ML) PREMIX INFUSION
INTRAVENOUS | Status: AC
  Administered 2021-12-06: 100 ug/h via INTRAVENOUS
  Filled 2021-12-06: qty 250

## 2021-12-06 MED ORDER — BUPIVACAINE-EPINEPHRINE (PF) 0.25% -1:200000 IJ SOLN
INTRAMUSCULAR | Status: AC
  Filled 2021-12-06: qty 30

## 2021-12-06 SURGICAL SUPPLY — 81 items
ADH SKN CLS APL DERMABOND .7 (GAUZE/BANDAGES/DRESSINGS) ×1
ANTIFOG SOL W/FOAM PAD STRL (MISCELLANEOUS) ×1
APL PRP STRL LF DISP 70% ISPRP (MISCELLANEOUS) ×1
BAG COUNTER SPONGE SURGICOUNT (BAG) ×2 IMPLANT
BAG SPNG CNTER NS LX DISP (BAG) ×1
BLADE CLIPPER SURG (BLADE) IMPLANT
BNDG GAUZE DERMACEA FLUFF 4 (GAUZE/BANDAGES/DRESSINGS) IMPLANT
BNDG GZE DERMACEA 4 6PLY (GAUZE/BANDAGES/DRESSINGS) ×1
CANISTER SUCT 3000ML PPV (MISCELLANEOUS) ×2 IMPLANT
CHLORAPREP W/TINT 26 (MISCELLANEOUS) ×2 IMPLANT
COVER SURGICAL LIGHT HANDLE (MISCELLANEOUS) ×2 IMPLANT
DERMABOND ADVANCED .7 DNX12 (GAUZE/BANDAGES/DRESSINGS) ×2 IMPLANT
DRAPE LAPAROSCOPIC ABDOMINAL (DRAPES) ×2 IMPLANT
DRAPE WARM FLUID 44X44 (DRAPES) ×2 IMPLANT
DRSG OPSITE POSTOP 4X10 (GAUZE/BANDAGES/DRESSINGS) IMPLANT
DRSG OPSITE POSTOP 4X8 (GAUZE/BANDAGES/DRESSINGS) IMPLANT
ELECT BLADE 6.5 EXT (BLADE) IMPLANT
ELECT CAUTERY BLADE 6.4 (BLADE) ×2 IMPLANT
ELECT REM PT RETURN 9FT ADLT (ELECTROSURGICAL) ×1
ELECTRODE REM PT RTRN 9FT ADLT (ELECTROSURGICAL) ×2 IMPLANT
GAUZE 4X4 16PLY ~~LOC~~+RFID DBL (SPONGE) IMPLANT
GAUZE PAD ABD 8X10 STRL (GAUZE/BANDAGES/DRESSINGS) IMPLANT
GLOVE BIO SURGEON STRL SZ7.5 (GLOVE) ×2 IMPLANT
GLOVE BIOGEL PI IND STRL 6.5 (GLOVE) IMPLANT
GLOVE BIOGEL PI IND STRL 7.5 (GLOVE) IMPLANT
GLOVE BIOGEL PI IND STRL 8 (GLOVE) ×2 IMPLANT
GLOVE ECLIPSE 7.0 STRL STRAW (GLOVE) IMPLANT
GLOVE SURG SYN 7.5  E (GLOVE) ×1
GLOVE SURG SYN 7.5 E (GLOVE) ×1 IMPLANT
GLOVE SURG SYN 7.5 PF PI (GLOVE) ×2 IMPLANT
GOWN STRL REUS W/ TWL LRG LVL3 (GOWN DISPOSABLE) ×4 IMPLANT
GOWN STRL REUS W/ TWL XL LVL3 (GOWN DISPOSABLE) ×2 IMPLANT
GOWN STRL REUS W/TWL LRG LVL3 (GOWN DISPOSABLE) ×2
GOWN STRL REUS W/TWL XL LVL3 (GOWN DISPOSABLE) ×1
HANDLE SUCTION POOLE (INSTRUMENTS) ×2 IMPLANT
KIT BASIN OR (CUSTOM PROCEDURE TRAY) ×2 IMPLANT
KIT OSTOMY DRAINABLE 2.75 STR (WOUND CARE) IMPLANT
KIT TURNOVER KIT B (KITS) ×2 IMPLANT
LIGASURE IMPACT 36 18CM CVD LR (INSTRUMENTS) IMPLANT
NDL HYPO 25GX1X1/2 BEV (NEEDLE) IMPLANT
NDL INSUFFLATION 14GA 120MM (NEEDLE) ×2 IMPLANT
NEEDLE HYPO 25GX1X1/2 BEV (NEEDLE) ×1 IMPLANT
NEEDLE INSUFFLATION 14GA 120MM (NEEDLE) ×1 IMPLANT
NS IRRIG 1000ML POUR BTL (IV SOLUTION) ×4 IMPLANT
PACK GENERAL/GYN (CUSTOM PROCEDURE TRAY) ×2 IMPLANT
PAD ARMBOARD 7.5X6 YLW CONV (MISCELLANEOUS) ×4 IMPLANT
PENCIL SMOKE EVACUATOR (MISCELLANEOUS) ×2 IMPLANT
RELOAD PROXIMATE 75MM BLUE (ENDOMECHANICALS) ×1 IMPLANT
RELOAD STAPLE 75 3.8 BLU REG (ENDOMECHANICALS) IMPLANT
SCISSORS LAP 5X35 DISP (ENDOMECHANICALS) IMPLANT
SET IRRIG TUBING LAPAROSCOPIC (IRRIGATION / IRRIGATOR) IMPLANT
SET TUBE SMOKE EVAC HIGH FLOW (TUBING) ×2 IMPLANT
SLEEVE ENDOPATH XCEL 5M (ENDOMECHANICALS) ×2 IMPLANT
SLEEVE Z-THREAD 5X100MM (TROCAR) IMPLANT
SOLUTION ANTFG W/FOAM PAD STRL (MISCELLANEOUS) IMPLANT
SPECIMEN JAR LARGE (MISCELLANEOUS) IMPLANT
SPONGE T-LAP 18X18 ~~LOC~~+RFID (SPONGE) IMPLANT
STAPLER CVD CUT BL 40 RELOAD (ENDOMECHANICALS) ×1 IMPLANT
STAPLER CVD CUT BLU 40 RELOAD (ENDOMECHANICALS) IMPLANT
STAPLER PROXIMATE 75MM BLUE (STAPLE) IMPLANT
STAPLER VISISTAT 35W (STAPLE) ×2 IMPLANT
SUCTION POOLE HANDLE (INSTRUMENTS) ×1
SUT MNCRL AB 4-0 PS2 18 (SUTURE) ×2 IMPLANT
SUT PDS AB 1 TP1 54 (SUTURE) IMPLANT
SUT SILK 2 0 SH CR/8 (SUTURE) ×2 IMPLANT
SUT SILK 2 0 TIES 10X30 (SUTURE) ×2 IMPLANT
SUT SILK 3 0 SH CR/8 (SUTURE) ×2 IMPLANT
SUT SILK 3 0 TIES 10X30 (SUTURE) ×2 IMPLANT
SUT VIC AB 3-0 SH 8-18 (SUTURE) IMPLANT
SYR 10ML LL (SYRINGE) IMPLANT
SYR CONTROL 10ML LL (SYRINGE) IMPLANT
TOWEL GREEN STERILE (TOWEL DISPOSABLE) ×2 IMPLANT
TOWEL GREEN STERILE FF (TOWEL DISPOSABLE) ×2 IMPLANT
TRAY FOLEY MTR SLVR 16FR STAT (SET/KITS/TRAYS/PACK) ×2 IMPLANT
TRAY LAPAROSCOPIC MC (CUSTOM PROCEDURE TRAY) ×2 IMPLANT
TROCAR XCEL 12X100 BLDLESS (ENDOMECHANICALS) IMPLANT
TROCAR XCEL BLUNT TIP 100MML (ENDOMECHANICALS) IMPLANT
TROCAR XCEL NON-BLD 11X100MML (ENDOMECHANICALS) IMPLANT
TROCAR Z-THREAD OPTICAL 5X100M (TROCAR) ×2 IMPLANT
WARMER LAPAROSCOPE (MISCELLANEOUS) ×2 IMPLANT
YANKAUER SUCT BULB TIP NO VENT (SUCTIONS) IMPLANT

## 2021-12-06 NOTE — Progress Notes (Signed)
CRITICAL RESULT PROVIDER NOTIFICATION  Test performed and critical result:  Lactic Acid 2.5   Date and time result received:  11/12/2021 1947  Provider name/title: Vladimir Faster MD- Pola Corn  Date and time provider notified: 11/23/2021  Date and time provider responded: 1950  Provider response:no new orders at this time

## 2021-12-06 NOTE — Consult Note (Signed)
NAME:  Kristi Erickson, MRN:  [id number], DOB:  Jul 02, 1940, LOS: 8 ADMISSION DATE:  12/05/2021, CONSULTATION DATE:  11/28 REFERRING MD:  Dr. Derrell Lolling, CHIEF COMPLAINT:  Perf sigmoid colon; shock   History of Present Illness:  Patient is 81 year old female with pertinent PMH of Turner syndrome, HTN, HLD, hypothyroidism, chronic stomach problems presents to [facility] ED on 11/20 with nausea and abdominal pain.  Patient admitted to [facility] ED on 11/20 with abdominal pain.  CT abdomen/pelvis showing small pneumoperitoneum with concern for bowel perforation.  WBC 37.  Surgery consulted.  Patient thought to have bowel obstruction.  Made n.p.o. given IV fluids.  NG tube on low intermittent suction.  Culture sent and started on antibiotics.  On 11/28 patient with abdominal distention.  Abdominal x-ray showing dilated small bowel but minimal.  Patient taken to the OR for surgery for ex lap.  Found to have perforated sigmoid colon.  Bowel resection performed and placing ostomy.  Patient now on multiple pressors: Vaso and levo.  Patient going to ICU post OR.  PCCM consulted.  Pertinent  Medical History   Past Medical History:  Diagnosis Date   Acute blood loss anemia    Anxiety    Ascending aortic aneurysm (HCC)    Depression    Diverticulitis    GERD (gastroesophageal reflux disease)    Glaucoma    Heart murmur    History of hiatal hernia    Hyperlipidemia    Hypertension    Hypokalemia    Hypothyroidism    IBS (irritable bowel syndrome)    Insomnia    Leukocytosis    Osteoporosis    Physical deconditioning    Stomach problems    Turner's syndrome    Vision disorder    Vitamin D deficiency      Significant Hospital Events: Including procedures, antibiotic start and stop dates in addition to other pertinent events   11/20: admitted to [facility] abdominal pain; possible bowel obstruction 11/28: increased bowel distention; went to OR; perf sigmoid colon; received bowel resection and   Interim  History / Subjective:  On 10 mcg levo Sedated on prop Intubated on mech vent  Objective   Blood pressure 119/73, pulse 100, temperature (!) 97.5 F (36.4 C), resp. rate 16, height 4\' 11"  (1.499 m), weight 50.6 kg, SpO2 90 %.        Intake/Output Summary (Last 24 hours) at 11/22/2021 1633 Last data filed at 11/30/2021 1611 Gross per 24 hour  Intake 370 ml  Output --  Net 370 ml   Filed Weights   11/29/21 0729 12/02/21 1648  Weight: 45 kg 50.6 kg    Examination: General: critically ill appearing on mech vent HEENT: MM pink/moist; ETT in place Neuro: sedated CV: s1s2, no m/r/g PULM:  dim clear BS bilaterally; on mech vent PRVC GI: soft, ostomy present Extremities: warm/dry, no edema  Skin: no rashes or lesions appreciated    Resolved Hospital Problem list     Assessment & Plan:  Perforated sigmoid colon s/p bowel resection and ostomy placement on 11/28 Small volume pneumoperitoneum P: -Surgery following; appreciate recs -N.p.o. -colostomy care per surgery -TPN and IV fluids -Broaden antibiotics to cefepime, Flagyl, Vanc -scds dvt ppx post op -check cbc  Shock: likely septic; minimal blood loss in surgery P: -continue levo for map goal >65; consider adding vaso if levo requirements >20 -check CBC -IV fluids -trend LA -broad spectrum abx as above -check bcx2 and resp culture -trend wbc/fever curve  Postop mechanical  vent management P: -Check CXR -LTVV strategy with tidal volumes of 6-8 cc/kg ideal body weight -check ABG and adjust settings accordingly -Wean PEEP/FiO2 for SpO2 >92% -VAP bundle in place -Daily SAT and SBT when appropriate -PAD protocol in place -wean sedation for RASS goal 0 to -1  Hypernatremia: dehydration related; improved Hypokalemia AKI: improved P: -IV fluids and TPN -replete electrolytes as needed -Trend BMP / urinary output -Replace electrolytes as indicated -Avoid nephrotoxic agents, ensure adequate renal  perfusion  Acute urinary retention P: -Place Foley -Consider Urecholine  HTN/HLD P: -hold statin while npo -Hold antihypertensives while hypotensive  Hypothyroidism P: -Hold Synthroid through tube while n.p.o.; consider switching to IV  Severe hypophosphatemia P: -TPN -Trend Phos  Depression P: -hold home mirtazapine  Best Practice (right click and "Reselect all SmartList Selections" daily)   Diet/type: NPO and TPN DVT prophylaxis: SCD GI prophylaxis: PPI Lines: N/A Foley:  N/A; will have nurse place foley Code Status:  DNR Last date of multidisciplinary goals of care discussion [11/28 updated brother Elijah Birkom over phone. Said he won't be able to come in person until Thursday.]  Labs   CBC: Recent Labs  Lab 11/30/21 0650 12/01/21 0907 12/02/21 0257 12/03/21 0352 07/07/21 0257  WBC 19.2* 19.1* 14.5* 13.2* 21.0*  NEUTROABS  --  17.0* 12.6* 11.1* 18.8*  HGB 15.2* 14.6 14.0 13.7 13.2  HCT 45.7 45.1 40.9 40.2 39.7  MCV 92.0 94.2 92.3 91.6 93.0  PLT 276 PLATELET CLUMPS NOTED ON SMEAR, UNABLE TO ESTIMATE 235 223 173    Basic Metabolic Panel: Recent Labs  Lab 12/02/21 0636 12/03/21 0352 12/03/21 2018 12/04/21 0410 12/05/21 0500 07/07/21 0257  NA 142 143  --  143 141 137  K 3.3* 3.7  --  3.1* 3.0* 3.4*  CL 104 107  --  105 106 102  CO2 27 28  --  27 29 25   GLUCOSE 149* 122*  --  136* 152* 149*  BUN 35* 26*  --  18 21 30*  CREATININE 0.86 0.90  --  0.75 0.79 0.73  CALCIUM 8.1* 8.7*  --  8.1* 8.1* 8.1*  MG 2.2 2.2  --  2.0 1.8 2.2  PHOS  --  <1.0* 2.4* 4.4 2.7 2.7   GFR: Estimated Creatinine Clearance: 38.3 mL/min (by C-G formula based on SCr of 0.73 mg/dL). Recent Labs  Lab 11/29/21 1639 11/30/21 0650 12/01/21 0907 12/02/21 0257 12/03/21 0352 07/07/21 0257  WBC  --    < > 19.1* 14.5* 13.2* 21.0*  LATICACIDVEN 1.3  --   --   --   --   --    < > = values in this interval not displayed.    Liver Function Tests: Recent Labs  Lab 12/01/21 0432  12/03/21 0352 12/05/21 0500  AST 17 35 30  ALT 17 26 31   ALKPHOS 60 70 83  BILITOT 1.0 0.5 0.4  PROT 5.2* 4.8* 4.8*  ALBUMIN 2.3* 2.0* 1.9*   No results for input(s): "LIPASE", "AMYLASE" in the last 168 hours. No results for input(s): "AMMONIA" in the last 168 hours.  ABG    Component Value Date/Time   PHART 7.405 06/28/2015 2236   PCO2ART 34.0 (L) 06/28/2015 2236   PO2ART 104.0 (H) 06/28/2015 2236   HCO3 21.4 06/28/2015 2236   TCO2 20 07/01/2015 1542   ACIDBASEDEF 3.0 (H) 06/28/2015 2236   O2SAT 98.0 06/28/2015 2236     Coagulation Profile: No results for input(s): "INR", "PROTIME" in the last 168 hours.  Cardiac  Enzymes: No results for input(s): "CKTOTAL", "CKMB", "CKMBINDEX", "TROPONINI" in the last 168 hours.  HbA1C: Hgb A1c MFr Bld  Date/Time Value Ref Range Status  12/03/2021 12:23 PM 5.9 (H) 4.8 - 5.6 % Final    Comment:    (NOTE)         Prediabetes: 5.7 - 6.4         Diabetes: >6.4         Glycemic control for adults with diabetes: <7.0   06/24/2015 01:10 PM 5.5 4.8 - 5.6 % Final    Comment:    (NOTE)         Pre-diabetes: 5.7 - 6.4         Diabetes: >6.4         Glycemic control for adults with diabetes: <7.0     CBG: Recent Labs  Lab 12/05/21 2349 12/02/2021 0523 12/05/2021 0850 11/27/2021 1142 12/02/2021 1447  GLUCAP 155* 165* 156* 171* 152*    Review of Systems:   Patient is encephalopathic and/or intubated. Therefore history has been obtained from chart review.    Past Medical History:  She,  has a past medical history of Acute blood loss anemia, Anxiety, Ascending aortic aneurysm (HCC), Depression, Diverticulitis, GERD (gastroesophageal reflux disease), Glaucoma, Heart murmur, History of hiatal hernia, Hyperlipidemia, Hypertension, Hypokalemia, Hypothyroidism, IBS (irritable bowel syndrome), Insomnia, Leukocytosis, Osteoporosis, Physical deconditioning, Stomach problems, Turner's syndrome, Vision disorder, and Vitamin D deficiency.   Surgical  History:   Past Surgical History:  Procedure Laterality Date   ABDOMINAL SURGERY  1995   1970s  cyst from abdomen removed   APPENDECTOMY     CARDIAC CATHETERIZATION N/A 06/03/2015   Procedure: Left Heart Cath and Coronary Angiography;  Surgeon: Corky Crafts, MD;  Location: [facility] INVASIVE CV LAB;  Service: Cardiovascular;  Laterality: N/A;   CATARACT EXTRACTION W/ INTRAOCULAR LENS  IMPLANT, BILATERAL     EYE SURGERY     left eye muscle     age 7   GLAUCOMA VALVE INSERTION     right eye   OTHER SURGICAL HISTORY  2010    ear surgery   REPLACEMENT ASCENDING AORTA N/A 06/28/2015   Procedure: REPLACEMENT ASCENDING AORTA AND ARCH;  Surgeon: Loreli Slot, MD;  Location: [facility] OR;  Service: Open Heart Surgery;  Laterality: N/A;   TEE WITHOUT CARDIOVERSION N/A 06/28/2015   Procedure: TRANSESOPHAGEAL ECHOCARDIOGRAM (TEE);  Surgeon: Loreli Slot, MD;  Location: [facility] OR;  Service: Open Heart Surgery;  Laterality: N/A;   TONSILLECTOMY     t+a   TRANSTHORACIC ECHOCARDIOGRAM  08/2011   EF=/> 55%, LA mildly dilated, midl mitral annular calcification, trace MR, trace TR, mild AV calcification w/mod regurg, trace pulm valve regurg     Social History:   reports that she has quit smoking. Her smoking use included cigarettes. She has never used smokeless tobacco. She reports current alcohol use. She reports that she does not use drugs.   Family History:  Her family history includes Heart attack in her father and maternal grandfather; Skin cancer in her brother.   Allergies Allergies  Allergen Reactions   Shellfish-Derived Products Nausea And Vomiting    Clam, oyster   Remeron [Mirtazapine] Rash     Home Medications  Prior to Admission medications   Medication Sig Start Date End Date Taking? Authorizing Provider  acetaminophen (TYLENOL) 500 MG tablet Take 500 mg by mouth daily.   Yes [provider]  alendronate (FOSAMAX) 70 MG tablet Take 70 mg by mouth  once a week.   Yes  [provider]  Ascorbic Acid (VITAMIN C) 1000 MG tablet Take 3,000 mg by mouth daily.    Yes [provider]  aspirin EC 81 MG tablet Take 1 tablet (81 mg total) by mouth daily. 12/04/17  Yes Hilty, Lisette Abu, MD  brimonidine (ALPHAGAN) 0.15 % ophthalmic solution Place 1 drop into the right eye 2 (two) times daily.   Yes [provider]  Calcium Carbonate (CALCIUM 600 PO) Take 600 mg by mouth 2 (two) times daily with a meal.   Yes [provider]  Cholecalciferol (VITAMIN D3 SUPER STRENGTH) 50 MCG (2000 UT) TABS Take 2,000 Units by mouth daily.   Yes [provider]  Docusate Sodium (COLACE PO) Take 1 capsule by mouth daily as needed (constipation).   Yes [provider]  dorzolamide (TRUSOPT) 2 % ophthalmic solution Place 1 drop into both eyes 2 (two) times daily.   Yes [provider]  FAMOTIDINE PO Take 28 mg by mouth daily.   Yes [provider]  hyoscyamine (LEVSIN) 0.125 MG tablet Take 0.125 mg by mouth every 4 (four) hours as needed for cramping.   Yes [provider]  levothyroxine (SYNTHROID, LEVOTHROID) 88 MCG tablet Take 88 mcg by mouth every morning.    Yes [provider]  lisinopril (PRINIVIL,ZESTRIL) 20 MG tablet Take 1 tablet (20 mg total) by mouth daily. 07/05/15  Yes Gold, Deniece Portela E, PA-C  Multiple Vitamins-Minerals (MULTIVITAMIN WOMEN 50+) TABS Take 1 tablet by mouth daily.   Yes [provider]  Peppermint Oil (IBGARD PO) Take 2 capsules by mouth 2 (two) times daily.   Yes [provider]  polyethylene glycol powder (GLYCOLAX/MIRALAX) 17 GM/SCOOP powder Take 17 g by mouth daily.   Yes [provider]  ROCKLATAN 0.02-0.005 % SOLN Place 1 drop into the left eye at bedtime. 03/25/19  Yes [provider]  simvastatin (ZOCOR) 40 MG tablet Take 40 mg by mouth every morning.    Yes [provider]  lactulose (CHRONULAC) 10 GM/15ML solution Take 10 g by  mouth 2 (two) times daily as needed (constipation). Patient not taking: Reported on 12/01/2021    [provider]  mirtazapine (REMERON) 45 MG tablet Take 45 mg by mouth at bedtime. Patient not taking: Reported on 12/01/2021    [provider]  ondansetron (ZOFRAN) 4 MG tablet Take 4 mg by mouth every 8 (eight) hours as needed for nausea or vomiting. Patient not taking: Reported on 12/01/2021    [provider]     Critical care time: 45 minutes    JD Anselm Lis [place] [facility] 12/08/21, 4:33 PM  Please see [email or web address] for pager details.  From 7A-7P if no response, please call [phone]. After hours, please call ELink [phone].

## 2021-12-06 NOTE — Transfer of Care (Signed)
Immediate Anesthesia Transfer of Care Note  Patient: Kristi Erickson  Procedure(s) Performed: LAPAROSCOPY DIAGNOSTIC (Abdomen) EXPLORATORY LAPAROTOMY (Abdomen)  Patient Location: PACU  Anesthesia Type:General  Level of Consciousness: Patient remains intubated per anesthesia plan  Airway & Oxygen Therapy: Patient remains intubated per anesthesia plan and Patient placed on Ventilator (see vital sign flow sheet for setting)  Post-op Assessment: Report given to RN and Post -op Vital signs reviewed and stable  Post vital signs: Reviewed and stable  Last Vitals:  Vitals Value Taken Time  BP 97/50 11/12/2021 1710  Temp    Pulse 78 12/02/2021 1711  Resp 18 11/30/2021 1711  SpO2 100 % 11/24/2021 1711  Vitals shown include unvalidated device data.  Last Pain:  Vitals:   12/03/2021 1703  TempSrc:   PainSc: Asleep      Patients Stated Pain Goal: 0 (12/04/21 2203)  Complications: No notable events documented.

## 2021-12-06 NOTE — Progress Notes (Signed)
PROGRESS NOTE    Kristi Erickson  MGQ:[id number] DOB: [birth year] DOA: December 15, 2021 PCP: Juanda Bond    Brief Narrative:  81 year old with history of chronic constipation, anxiety, depression, GERD, hypertension hyperlipidemia and hypothyroidism, Turner syndrome and hard of hearing presented with not having bowel movement for more than 10 days, not eating, abdominal pain, distention and discomfort.  In the emergency room white cell count was 37.6.  CT scan abdomen showed a small pneumoperitoneum concerning for bowel perforation.  Admitted with surgical consultation. Remained in the hospital with poor bowel function recovery. 11/26, patient was started on clear liquid diet 11/27, she again has poor bowel sounds and recurrence of distention. Failed conservative management.  Recommended exploratory laparotomy.  Assessment & Plan:   Pneumoperitoneum with bowel perforation Severe constipation.  Persistent ileus in the small bowel dilatation.  CT without contrast on presentation 11/20 with a small pneumoperitoneum, sigmoid diverticulosis.  WBC count 38,000. Prolonged conservative management without adequate improvement of bowel function. Patient will benefit with ex lap.  Discussed with surgery. Continue to mobilize.  Respiratory therapy and incentive spirometry. Prolonged n.p.o., currently on TPN that we will continue. Pharmacy managing TPN as well as electrolyte replacements. Continue Zosyn until clinical improvement.  Dehydration with hypernatremia and acute kidney injury: Due to poor oral intake and ACE inhibitors.  Adequately improved.  Hypokalemia: Replace with IV fluid.  TPN being managed by pharmacy.  Acute urinary retention: Needing frequent to straight cath.  Foley catheter was removed.  Essential hypertension: Stable.  Continue to hold antihypertensives.    Hypothyroidism: TSH 6.3.  Resumed thyroxine.    Severe hypophosphatemia: Replace with TPN.  Adequate  today.   Nutrition Status: Nutrition Problem: Moderate Malnutrition Etiology: acute illness Signs/Symptoms: energy intake < or equal to 50% for > or equal to 5 days, moderate fat depletion, moderate muscle depletion Interventions: TPN Currently on TPN  Out of bed.  PT OT.  Chest physiotherapy.   DVT prophylaxis: heparin injection 5,000 Units Start: 11/29/21 1400   Code Status: DNR Family Communication: Brother on the phone. Disposition Plan: Status is: Inpatient Remains inpatient appropriate because: Anticipated surgery.   Consultants:  General surgery  Procedures:  None  Antimicrobials:  Zosyn 11/20---   Subjective: Patient seen and examined.  Poor historian.  She tells that she is doing fine, no meaningful bowel movements.  Her abdomen is more distended.  She has  cough and poor lung clearance.  I discussed with patient about needing surgery as there is no other option to improve her bowel function.  Surgery is going to be high risk and may involve removing her part of the intestine and multiple procedures.  Patient asked me to put her brother on the phone, we medicated with her brother.  Patient is convinced that she will need surgery.  She wanted to talk to surgery team about doing surgery tomorrow morning With worsening leukocytosis, likely benefit with ex lap urgently.    Objective: Vitals:   12/05/21 1550 12/05/21 1942 11/21/2021 0518 11/30/2021 0848  BP:  137/78 133/86 130/84  Pulse:  99 94 94  Resp:  18 17 16   Temp:  98.8 F (37.1 C) 98.5 F (36.9 C) 98.1 F (36.7 C)  TempSrc:   Oral Axillary  SpO2: 92% 95% 97% 98%  Weight:      Height:        Intake/Output Summary (Last 24 hours) at 12/02/2021 1209 Last data filed at 12/05/2021 1712 Gross per 24 hour  Intake 828.84  ml  Output 100 ml  Net 728.84 ml    Filed Weights   11/29/21 0729 12/02/21 1648  Weight: 45 kg 50.6 kg    Examination:  General exam: Appears calm and comfortable .  Frail and  debilitated.   Respiratory system: Respiratory effort normal.  Conducted upper airway sounds. Cough and upper airway secretions. Cardiovascular system: S1 & S2 heard, RRR.  Gastrointestinal system: Soft.  Distended but nontender.  Bowel sounds absent. Central nervous system: Alert and oriented. No focal neurological deficits.flat affect.  Generalized weakness. Extremities: Symmetric 5 x 5 power.  Generalized weakness.   Data Reviewed: I have personally reviewed following labs and imaging studies  CBC: Recent Labs  Lab 11/30/21 0650 12/01/21 0907 12/02/21 0257 12/03/21 0352 Dec 30, 2021 0257  WBC 19.2* 19.1* 14.5* 13.2* 21.0*  NEUTROABS  --  17.0* 12.6* 11.1* 18.8*  HGB 15.2* 14.6 14.0 13.7 13.2  HCT 45.7 45.1 40.9 40.2 39.7  MCV 92.0 94.2 92.3 91.6 93.0  PLT 276 PLATELET CLUMPS NOTED ON SMEAR, UNABLE TO ESTIMATE 235 223 173    Basic Metabolic Panel: Recent Labs  Lab 12/02/21 0636 12/03/21 0352 12/03/21 2018 12/04/21 0410 12/05/21 0500 12/30/2021 0257  NA 142 143  --  143 141 137  K 3.3* 3.7  --  3.1* 3.0* 3.4*  CL 104 107  --  105 106 102  CO2 27 28  --  27 29 25   GLUCOSE 149* 122*  --  136* 152* 149*  BUN 35* 26*  --  18 21 30*  CREATININE 0.86 0.90  --  0.75 0.79 0.73  CALCIUM 8.1* 8.7*  --  8.1* 8.1* 8.1*  MG 2.2 2.2  --  2.0 1.8 2.2  PHOS  --  <1.0* 2.4* 4.4 2.7 2.7    GFR: Estimated Creatinine Clearance: 38.3 mL/min (by C-G formula based on SCr of 0.73 mg/dL). Liver Function Tests: Recent Labs  Lab 12/01/21 0432 12/03/21 0352 12/05/21 0500  AST 17 35 30  ALT 17 26 31   ALKPHOS 60 70 83  BILITOT 1.0 0.5 0.4  PROT 5.2* 4.8* 4.8*  ALBUMIN 2.3* 2.0* 1.9*    No results for input(s): "LIPASE", "AMYLASE" in the last 168 hours.  No results for input(s): "AMMONIA" in the last 168 hours. Coagulation Profile: No results for input(s): "INR", "PROTIME" in the last 168 hours. Cardiac Enzymes: No results for input(s): "CKTOTAL", "CKMB", "CKMBINDEX", "TROPONINI"  in the last 168 hours. BNP (last 3 results) No results for input(s): "PROBNP" in the last 8760 hours. HbA1C: Recent Labs    12/03/21 1223  HGBA1C 5.9*    CBG: Recent Labs  Lab 12/05/21 2042 12/05/21 2349 December 30, 2021 0523 12/30/21 0850 30-Dec-2021 1142  GLUCAP 182* 155* 165* 156* 171*    Lipid Profile: Recent Labs    12/05/21 0500  TRIG 127    Thyroid Function Tests: No results for input(s): "TSH", "T4TOTAL", "FREET4", "T3FREE", "THYROIDAB" in the last 72 hours.  Anemia Panel: No results for input(s): "VITAMINB12", "FOLATE", "FERRITIN", "TIBC", "IRON", "RETICCTPCT" in the last 72 hours. Sepsis Labs: Recent Labs  Lab 11/29/21 1639  LATICACIDVEN 1.3     Recent Results (from the past 240 hour(s))  Blood culture (routine x 2)     Status: None   Collection Time: 11/16/2021  8:00 PM   Specimen: BLOOD LEFT HAND  Result Value Ref Range Status   Specimen Description BLOOD LEFT HAND  Final   Special Requests   Final    BOTTLES DRAWN AEROBIC AND ANAEROBIC  Blood Culture adequate volume   Culture   Final    NO GROWTH 5 DAYS Performed at [facility], [address] [place], [place] [address]    Report Status 12/03/2021 FINAL  Final  Blood culture (routine x 2)     Status: None   Collection Time: December 15, 2021  8:00 PM   Specimen: BLOOD  Result Value Ref Range Status   Specimen Description BLOOD RIGHT ANTECUBITAL  Final   Special Requests   Final    BOTTLES DRAWN AEROBIC AND ANAEROBIC Blood Culture adequate volume   Culture   Final    NO GROWTH 5 DAYS Performed at [facility], [address], [place], [place] [address]    Report Status 12/03/2021 FINAL  Final         Radiology Studies: [place] EKG SITE RITE  Result Date: 12/07/2021 If Site Rite image not attached, placement could not be confirmed due to current cardiac rhythm.  DG Abd Portable 1V  Result Date: 11/16/2021 CLINICAL DATA:  Small-bowel obstruction. EXAM: PORTABLE ABDOMEN - 1 VIEW COMPARISON:   KUB 1 day prior. FINDINGS: Gaseous distention of the bowel is unchanged. There is no definite free intraperitoneal air, within the confines of supine technique. Abdominal surgical clips are unchanged. There is no acute osseous abnormality. IMPRESSION: Gaseous distention of the bowel is unchanged. Electronically Signed   By: Lesia Hausen M.D.   On: 12/05/2021 08:03   DG Abd Portable 1V  Result Date: 12/05/2021 CLINICAL DATA:  Small-bowel obstruction EXAM: PORTABLE ABDOMEN - 1 VIEW COMPARISON:  Radiograph 12/04/2021 FINDINGS: Persistent small bowel dilation, measuring up to 5.4 cm in, similar prior. Surgical clips overlie the mid abdomen. Multilevel degenerative changes of spine. IMPRESSION: Persistent small bowel dilation concerning for small bowel obstruction. Electronically Signed   By: Caprice Renshaw M.D.   On: 12/05/2021 10:43        Scheduled Meds:  Chlorhexidine Gluconate Cloth  6 each Topical Daily   docusate  200 mg Oral BID   heparin injection (subcutaneous)  5,000 Units Subcutaneous Q8H   insulin aspart  0-9 Units Subcutaneous Q4H   levothyroxine  88 mcg Oral Q0600   pantoprazole (PROTONIX) IV  40 mg Intravenous Q12H   polyethylene glycol  17 g Oral BID   sodium chloride flush  10-40 mL Intracatheter Q12H   Continuous Infusions:  piperacillin-tazobactam (ZOSYN)  IV 3.375 g (12/01/2021 0526)   potassium chloride 10 mEq (11/21/2021 1159)   TPN ADULT (ION) 55 mL/hr at 12/05/21 1803   TPN ADULT (ION)       LOS: 8 days    Time spent: 35 minutes    Dorcas Carrow, MD [facility] Pager [phone]

## 2021-12-06 NOTE — Anesthesia Preprocedure Evaluation (Signed)
Anesthesia Evaluation  Patient identified by MRN, date of birth, ID band Patient awake    Reviewed: Allergy & Precautions, H&P , NPO status , Patient's Chart, lab work & pertinent test results  Airway Mallampati: II   Neck ROM: full    Dental   Pulmonary former smoker   breath sounds clear to auscultation       Cardiovascular hypertension, + Peripheral Vascular Disease   Rhythm:regular Rate:Normal  Ascending aortic aneurysm   Neuro/Psych  PSYCHIATRIC DISORDERS Anxiety Depression       GI/Hepatic hiatal hernia,GERD  ,,  Endo/Other  Hypothyroidism    Renal/GU      Musculoskeletal   Abdominal   Peds  Hematology   Anesthesia Other Findings   Reproductive/Obstetrics                             Anesthesia Physical Anesthesia Plan  ASA: 3  Anesthesia Plan: General   Post-op Pain Management:    Induction: Intravenous  PONV Risk Score and Plan: 3 and Ondansetron, Dexamethasone and Treatment may vary due to age or medical condition  Airway Management Planned: Oral ETT  Additional Equipment:   Intra-op Plan:   Post-operative Plan: Extubation in OR  Informed Consent: I have reviewed the patients History and Physical, chart, labs and discussed the procedure including the risks, benefits and alternatives for the proposed anesthesia with the patient or authorized representative who has indicated his/her understanding and acceptance.     Dental advisory given  Plan Discussed with: CRNA, Anesthesiologist and Surgeon  Anesthesia Plan Comments:        Anesthesia Quick Evaluation

## 2021-12-06 NOTE — Progress Notes (Signed)
Received pt from OR team and placed on ventilator in PACU.

## 2021-12-06 NOTE — Anesthesia Procedure Notes (Signed)
Procedure Name: Intubation Date/Time: 11/20/2021 3:01 PM  Performed by: Griffin Dakin, CRNAPre-anesthesia Checklist: Patient identified, Emergency Drugs available, Suction available and Patient being monitored Patient Re-evaluated:Patient Re-evaluated prior to induction Oxygen Delivery Method: Circle system utilized Preoxygenation: Pre-oxygenation with 100% oxygen Induction Type: IV induction and Rapid sequence Laryngoscope Size: Mac and 4 Grade View: Grade II Tube type: Oral Tube size: 7.0 mm Number of attempts: 1 Airway Equipment and Method: Stylet Placement Confirmation: ETT inserted through vocal cords under direct vision, positive ETCO2 and breath sounds checked- equal and bilateral Secured at: 22 cm Tube secured with: Tape Dental Injury: Teeth and Oropharynx as per pre-operative assessment

## 2021-12-06 NOTE — Progress Notes (Signed)
Pulled patients endotracheal tube back from 21cm to 20cm as per order. B/S equal.

## 2021-12-06 NOTE — Progress Notes (Addendum)
PHARMACY - TOTAL PARENTERAL NUTRITION CONSULT NOTE   Indication:  prolonged NPO status due to nausea/vomiting in the setting of pneumoperitoneum and constipation   Patient Measurements: Height: 4' 11" (149.9 cm) Weight: 50.6 kg (111 lb 8.8 oz) IBW/kg (Calculated) : 43.2 TPN AdjBW (KG): 45 Body mass index is 22.53 kg/m. Usual Weight: 48kg, ~ 105/106 lbs per patient   Assessment: Patient with PMH notable for aortic insufficiency, hypertension, HLD, hypothyroidism, Turner syndrome, horseshoe kidney presented with initial concern for a small bowel obstruction. Patient reports no bowel movement in 10 days. Throughout this time she has had poor oral intake saying she only ate 1-2 crackers per day prior to admission. Prior to this, patient had adequate oral intake stating she ate 3 meals per day. Average meal included protein, carbs, and some fat (for ex: breakfast: 1-2 eggs, sausage, and english muffin). General surgery consulted for pneumoperitoneum seen on CT scan.  Surgery recommending NPO status and medical management for now. 11/28 - PT deemed to have a SBO.  Of note, patient has shellfish allergy reported. Patient confirms allergy in the chart with reported reaction of nausea and vomiting. Patient reports that she has tolerated other fish besides oysters and scallops. No anaphylaxis reported in the past.   Glucose / Insulin: no history of diabetes noted, last A1C from several years ago 5.5%  Blood sugars controlled, 8u SSI/24h Electrolytes: phos: 2.7, K: 3.4, Na: 137, Mag: 2.2, CoCa: 9.7, HCO: 25, Cl: 102 Renal: Scr: 0.73, BUN:30  Hepatic: AST/ALT WNL, Alk phos: 83, TG 127, albumin: 1.9, T-bili: 0.4 Intake / Output; MIVF: UOP: not well documented, LBM 11/27  GI Imaging: 11/20: CT abdomen, small pneumoperitoneum  11/26 Abd x-ray - Stable small bowel dilatation is noted concerning for distal small bowel obstruction or possibly ileus  11/27 Abd x-ray -Persistent small bowel dilation concerning  for small bowel obstruction. GI Surgeries / Procedures: none   Central access: PICC line placed 11/25  TPN start date: 11/25   Nutritional Goals: Goal TPN rate is 55 mL/hr (provides 65 g of protein and 1350 kcals per day (30kcal/kg) Estimated Needs Total Energy Estimated Needs: 1350-1575 kcals Total Protein Estimated Needs: 65-80 gm Total Fluid Estimated Needs: >/= 1.3 L  Current Nutrition:  TPN   Plan:  Continue TPN to goal of 55mL/hr at 1800, will meet approximately 100% of nutritional needs providing 1350kcal and 65g AA.  Electrolytes in TPN: Na 55mEq/L, K 55mEq/L, Ca 5mEq/L, Mg 7mEq/L, and increase Phos 22mmol/L. Cl:Ac 1:2 given chloride borderline high.  Kcl 10 meq x 2 Add standard MVI and trace elements to TPN Initiate Sensitive q4h SSI at 1800 and adjust as needed  Monitor TPN labs on Mon/Thurs, daily until stabilization  Cathy , PharmD, FCCM Clinical Pharmacist Please see AMION for all Pharmacists' Contact Phone Numbers 11/29/2021, 8:58 AM    

## 2021-12-06 NOTE — Progress Notes (Signed)
Subjective/Chief Complaint: Pt with no acute changes] 1BM noted per nursing   Objective: Vital signs in last 24 hours: Temp:  [98.5 F (36.9 C)-98.8 F (37.1 C)] 98.5 F (36.9 C) (11/28 0518) Pulse Rate:  [94-99] 94 (11/28 0848) Resp:  [16-18] 16 (11/28 0848) BP: (130-141)/(64-86) 130/84 (11/28 0848) SpO2:  [89 %-98 %] 98 % (11/28 0848) Last BM Date : 12/03/21  Intake/Output from previous day: 11/27 0701 - 11/28 0700 In: 1078.8 [P.O.:360; I.V.:10; IV Piggyback:708.8] Out: 100 [Urine:100] Intake/Output this shift: No intake/output data recorded.  PE:  Constitutional: No acute distress, conversant, appears states age. Eyes: Anicteric sclerae, moist conjunctiva, no lid lag Lungs: Clear to auscultation bilaterally, normal respiratory effort CV: regular rate and rhythm, no murmurs, no peripheral edema, pedal pulses 2+ GI: Soft, no masses or hepatosplenomegaly, non-tender to palpation, distended Skin: No rashes, palpation reveals normal turgor Psychiatric: appropriate judgment and insight, oriented to person, place, and time   Lab Results:  Recent Labs    12/01/2021 0257  WBC 21.0*  HGB 13.2  HCT 39.7  PLT 173   BMET Recent Labs    12/05/21 0500 11/13/2021 0257  NA 141 137  K 3.0* 3.4*  CL 106 102  CO2 29 25  GLUCOSE 152* 149*  BUN 21 30*  CREATININE 0.79 0.73  CALCIUM 8.1* 8.1*   PT/INR No results for input(s): "LABPROT", "INR" in the last 72 hours. ABG No results for input(s): "PHART", "HCO3" in the last 72 hours.  Invalid input(s): "PCO2", "PO2"  Studies/Results: [place] EKG SITE RITE  Result Date: 11/16/2021 If Site Rite image not attached, placement could not be confirmed due to current cardiac rhythm.  DG Abd Portable 1V  Result Date: 11/29/2021 CLINICAL DATA:  Small-bowel obstruction. EXAM: PORTABLE ABDOMEN - 1 VIEW COMPARISON:  KUB 1 day prior. FINDINGS: Gaseous distention of the bowel is unchanged. There is no definite free intraperitoneal  air, within the confines of supine technique. Abdominal surgical clips are unchanged. There is no acute osseous abnormality. IMPRESSION: Gaseous distention of the bowel is unchanged. Electronically Signed   By: Valetta Mole M.D.   On: 11/27/2021 08:03   DG Abd Portable 1V  Result Date: 12/05/2021 CLINICAL DATA:  Small-bowel obstruction EXAM: PORTABLE ABDOMEN - 1 VIEW COMPARISON:  Radiograph 12/04/2021 FINDINGS: Persistent small bowel dilation, measuring up to 5.4 cm in, similar prior. Surgical clips overlie the mid abdomen. Multilevel degenerative changes of spine. IMPRESSION: Persistent small bowel dilation concerning for small bowel obstruction. Electronically Signed   By: Maurine Simmering M.D.   On: 12/05/2021 10:43   DG Abd Portable 1V  Result Date: 12/04/2021 CLINICAL DATA:  Ileus. EXAM: PORTABLE ABDOMEN - 1 VIEW COMPARISON:  December 02, 2021. FINDINGS: Stable small bowel dilatation is noted concerning for distal small obstruction or possibly ileus. Stool is noted in the nondilated colon. Surgical clips are seen in epigastric region. Nasogastric tube has been removed. IMPRESSION: Stable small bowel dilatation is noted concerning for distal small bowel obstruction or possibly ileus. Electronically Signed   By: Marijo Conception M.D.   On: 12/04/2021 10:35    Anti-infectives: Anti-infectives (From admission, onward)    Start     Dose/Rate Route Frequency Ordered Stop   12/02/21 2200  piperacillin-tazobactam (ZOSYN) IVPB 3.375 g        3.375 g 12.5 mL/hr over 240 Minutes Intravenous Every 8 hours 12/02/21 1340     11/29/21 0600  piperacillin-tazobactam (ZOSYN) IVPB 2.25 g  Status:  Discontinued  2.25 g 100 mL/hr over 30 Minutes Intravenous Every 8 hours 12/07/2021 2342 12/02/21 1340   11/26/2021 2345  piperacillin-tazobactam (ZOSYN) IVPB 3.375 g  Status:  Discontinued        3.375 g 12.5 mL/hr over 240 Minutes Intravenous  Once 11/11/2021 2330 11/29/21 0003   11/16/2021 2115   piperacillin-tazobactam (ZOSYN) IVPB 3.375 g        3.375 g 100 mL/hr over 30 Minutes Intravenous  Once 11/19/2021 2113 11/20/2021 2345       Assessment/Plan: Chronic constipation, no BM in 10d PTA Small volume pneumoperitoneum on CT - plain film yesterday with some dilated small bowel still but minimal, but c/o increasing distention today and not taking in much of CLD.   -small BM yesterday per charting -check plain film again -had a conversation with patient that I would recommend surgery with con't abd distention and increasing WBC.  She states she does not want surgery today.  She wants to talk with her brother.  We will follow up later this PM   FEN: NPO for now ID: [email or web address]. WBC up to 21 today VTE: SCDs, Heparin subq   Per primary: Dehydration AKI Anxiety/depression GERD HLD HTN Hypothyroidism IBS Turner syndrome hard of hearing  History of ascending and arch aneurysm with graft 2017 H/o prior abdominal surgery, at least surgery for ovarian cyst but she is unable to remember further surgical history      LOS: 8 days    Axel Filler Dec 22, 2021

## 2021-12-06 NOTE — Progress Notes (Signed)
Pharmacy Antibiotic Note  Kristi Erickson is a 81 y.o. female admitted on 11/19/2021 with intra-abdominal infection, initiated on Zosyn 11/20-11/28. Pharmacy has been consulted for vancomycin/cefepime dosing now for sepsis. Also with Flagyl ordered per MD. AKI earlier this admission resolved - SCr stable and down to 0.73.  Plan: Cefepime 2g IV q12h Vancomycin 1250mg  IV q48h. Goal AUC 400-550. Expected AUC: 475 SCr used: 0.8 Flagyl 500mg  IV q12h Monitor clinical progress, c/s, renal function F/u de-escalation plan/LOT, vancomycin levels as indicated   Height: 4\' 11"  (149.9 cm) Weight: 50.6 kg (111 lb 8.8 oz) IBW/kg (Calculated) : 43.2  Temp (24hrs), Avg:98.1 F (36.7 C), Min:97.5 F (36.4 C), Max:98.8 F (37.1 C)  Recent Labs  Lab 11/30/21 0650 12/01/21 0432 12/01/21 0907 12/02/21 0257 12/02/21 0636 12/03/21 0352 12/04/21 0410 12/05/21 0500 Dec 10, 2021 0257  WBC 19.2*  --  19.1* 14.5*  --  13.2*  --   --  21.0*  CREATININE 1.59*   < >  --   --  0.86 0.90 0.75 0.79 0.73   < > = values in this interval not displayed.    Estimated Creatinine Clearance: 38.3 mL/min (by C-G formula based on SCr of 0.73 mg/dL).    Allergies  Allergen Reactions   Shellfish-Derived Products Nausea And Vomiting    Clam, oyster   Remeron [Mirtazapine] Rash     11/14/2021, PharmD, BCPS Please check AMION for all [facility] contact numbers Clinical Pharmacist 12/10/21 6:03 PM

## 2021-12-06 NOTE — Progress Notes (Signed)
eLink Physician-Brief Progress Note Patient Name: Kristi Erickson DOB: November 29, 1940 MRN: [id number]   Date of Service  11/15/2021  HPI/Events of Note  81/F with perforated sigmoid colon, s/p sigmoid colon resection with Hartman's colostomy. No complications reported. Pt remains in shock, on vasopressor support. She also remains intubated post op.   eICU Interventions  Septic shock - Continue empiric antibiotics, follow cultures and adjust as warranted.  - Maintain on vasopressor support. Titrate to target MAP >65 - Source control with exploratory laparotomy, bowel resection, colostomy done.  - Serial abdominal exam.  - TREnd lactate - Monitor I/Os closely  2. Acute respiratory failure - PT intubated for procedure.  - Maintain TV 6-4ml/kg PBW, target plateau pressures <30 - Downtitrate FiO2, PEEP to maitnain SpO2 >90% - Will maintain on pain control, sedation. Plan for sedation holiday in AM, possible extubation.           [name] M DELA CRUZ 11/26/2021, 8:37 PM

## 2021-12-06 NOTE — Op Note (Signed)
11/25/2021  4:32 PM  PATIENT:  Kristi Erickson  81 y.o. female  PRE-OPERATIVE DIAGNOSIS:  pneumoperioneum, SBO  POST-OPERATIVE DIAGNOSIS:  pneumoperioneum, SBO  PROCEDURE:  Procedure(s): LAPAROSCOPY DIAGNOSTIC (N/A) EXPLORATORY LAPAROTOMY (N/A) Sigmoid colon resection with Hartman's colostomy  SURGEON:  Surgeon(s) and Role:    Axel Filler, MD - Primary  ASSISTANTS: Lu Duffel, MD, PGY-5  ANESTHESIA:   local and general  EBL:  minimal   BLOOD ADMINISTERED:none  DRAINS: none   LOCAL MEDICATIONS USED:  BUPIVICAINE   SPECIMEN:  Source of Specimen: Sigmoid colon with perforation and portion of left colon sent separately  DISPOSITION OF SPECIMEN:  PATHOLOGY  COUNTS:  YES  TOURNIQUET:  * No tourniquets in log *  DICTATION: .Dragon Dictation Indication procedure: Patient is a 81 year old female who has a history of failure, pneumoperitoneum on CT scan.  Patient had subsequently failed to progress with signs of SBO.  Patient was counseled and taken to the operating room for diagnostic laparoscopy, ex lap.  Findings: Patient had significant amount of adhesions low into her pelvis.  There is was some spillage of stool that was contained to the right lower quadrant area.  There was some reactive small bowel and fibrinous adhesions to the right lower quadrant.  It appeared that the patient had a perforated tic with some stool as well of some inflammation.  Patient underwent Hartman's resection and end colostomy.  The stapled end of the rectum was not tagged.  Details of procedure: After the patient was consented patient was taken back to the OR placed supine position with bilateral SCDs in place.  She underwent general endotracheal intubation.  Patient was then prepped draped sterile fashion.  A timeout was called and all facts were verified.  A Varithena technique was used insufflate the abdomen to 15 m of mercury in the left subcostal margin.  Soft with this  vibratory care placed under valid.  There is significant mount of small bowel dilation.  This did not allow me to fully assess the abdomen.  This time proceeded with a laparotomy.  10.  Blade was used to make a midline incision.  Dissection taken linea alba.  This was started from the suprapubic area and progressed superiorly.  Patient had mental adhesions that were taken down with cautery.  These were fairly thin adhesions.  Most lesions from the right upper quadrant right lower quadrant areas.  These were broken up with finger fracture and cautery.  Once the bowel was freed up.  I was able to find limited Treitz.  The small bowel was ran from ligament of Treitz distally.  There was some venous congestion of the small bowel.  The superior did not be ischemic in any portion.  Upon progressing on the right lower quadrant from the liver there is a large amount of succus that was visualized.  The right lower quadrant was adherent to the area of previous perforation.  The terminal ileum and distal ileum were stuck down to the sigmoid colon area.  The appendix and right colon were also seen to be stuck down to the area which was loculated off the perforation.  He was able to assess whether or not there was a perforation of the cecum.  The white line of Toldt on the right lower quadrant was incised.  I was able to medialize the right colon.  This appeared to be intact however was distended.  We then focused our attention to the pelvis.  The sigmoid colon was  adherent to the left lower pelvic wall.  This was freed up with blunt dissection.  The area of perforation could be seen but this appeared to be a perforated tic.  There was some spillage.  At this time we chose an area of the left colon to be transected.  This was transected with a 75 GIA stapler.  A portion of the rectum near the sacral promontory was chosen to be resected.  A blue load contour stapler was used to transect the distal portion of the rectum.  At  this time a LigaSure device was used to transect and ligate the mesentery of the sigmoid colon.  This was sent off to pathology.  The pelvic cavity was irrigated with sterile saline.  At this time NG tube was placed secured.  This was confirmed with intra-abdominal palpation.  At this time a area of the left upper quadrant was chosen for the ostomy site.  The skin was removed.  The colon appeared to be dusky and this was dissected back in the splenic flexure was taken down.  We resected approximately 5 cm of colon proximally.  The mesentery of this was ligated using the LigaSure device.  At this time the end colostomy is brought up through the skin incision after the rectus muscles were incised cruciate fashion.  This allowed 2 finger breaths to come out through the ostomy site.  At this time the midline fascia was then reapproximated using #1 running single-stranded PDS x 2.  At this time staple line of the ostomy was removed.  The ostomy matured to the skin using 3-0 Vicryl's interrupted fashion.  The midline was then packed with saline soaked Kerlix.  This was dressed with ABD pad and tape.  Patient tolerated the procedure well was taken to the recovery in stable condition.    PLAN OF CARE: Admit to inpatient   PATIENT DISPOSITION:  ICU - extubated and stable.   Delay start of Pharmacological VTE agent (>24hrs) due to surgical blood loss or risk of bleeding: yes

## 2021-12-06 NOTE — Progress Notes (Signed)
Patient transported to 2H on 100% Fi02 using ventilator without incident.

## 2021-12-06 NOTE — Progress Notes (Signed)
PT Cancellation Note  Patient Details Name: Kristi Erickson MRN: [id number] DOB: 1940-11-10   Cancelled Treatment:    Reason Eval/Treat Not Completed: Patient at procedure or test/unavailable - will check back as schedule allows.  Marye Round, PT DPT Acute Rehabilitation Services Pager [phone]  Office [phone]    Truddie Coco 11/30/2021, 1:26 PM

## 2021-12-07 ENCOUNTER — Encounter (HOSPITAL_COMMUNITY): Payer: Self-pay | Admitting: General Surgery

## 2021-12-07 ENCOUNTER — Inpatient Hospital Stay (HOSPITAL_COMMUNITY): Payer: Medicare Other

## 2021-12-07 DIAGNOSIS — Z9911 Dependence on respirator [ventilator] status: Secondary | ICD-10-CM | POA: Diagnosis not present

## 2021-12-07 DIAGNOSIS — A419 Sepsis, unspecified organism: Secondary | ICD-10-CM

## 2021-12-07 DIAGNOSIS — J690 Pneumonitis due to inhalation of food and vomit: Secondary | ICD-10-CM

## 2021-12-07 DIAGNOSIS — K668 Other specified disorders of peritoneum: Secondary | ICD-10-CM | POA: Diagnosis not present

## 2021-12-07 DIAGNOSIS — J9601 Acute respiratory failure with hypoxia: Secondary | ICD-10-CM | POA: Diagnosis not present

## 2021-12-07 DIAGNOSIS — R6521 Severe sepsis with septic shock: Secondary | ICD-10-CM

## 2021-12-07 LAB — GLUCOSE, CAPILLARY
Glucose-Capillary: 135 mg/dL — ABNORMAL HIGH (ref 70–99)
Glucose-Capillary: 137 mg/dL — ABNORMAL HIGH (ref 70–99)
Glucose-Capillary: 181 mg/dL — ABNORMAL HIGH (ref 70–99)
Glucose-Capillary: 183 mg/dL — ABNORMAL HIGH (ref 70–99)
Glucose-Capillary: 199 mg/dL — ABNORMAL HIGH (ref 70–99)
Glucose-Capillary: 210 mg/dL — ABNORMAL HIGH (ref 70–99)
Glucose-Capillary: 216 mg/dL — ABNORMAL HIGH (ref 70–99)

## 2021-12-07 LAB — BASIC METABOLIC PANEL
Anion gap: 9 (ref 5–15)
Anion gap: 8 (ref 5–15)
Anion gap: 9 (ref 5–15)
Anion gap: 17 — ABNORMAL HIGH (ref 5–15)
BUN: 39 mg/dL — ABNORMAL HIGH (ref 8–23)
BUN: 38 mg/dL — ABNORMAL HIGH (ref 8–23)
BUN: 48 mg/dL — ABNORMAL HIGH (ref 8–23)
BUN: 53 mg/dL — ABNORMAL HIGH (ref 8–23)
CO2: 18 mmol/L — ABNORMAL LOW (ref 22–32)
CO2: 14 mmol/L — ABNORMAL LOW (ref 22–32)
CO2: 20 mmol/L — ABNORMAL LOW (ref 22–32)
CO2: 23 mmol/L (ref 22–32)
Calcium: 6.1 mg/dL — CL (ref 8.9–10.3)
Calcium: 5.6 mg/dL — CL (ref 8.9–10.3)
Calcium: >15 mg/dL (ref 8.9–10.3)
Calcium: 8.3 mg/dL — ABNORMAL LOW (ref 8.9–10.3)
Chloride: 105 mmol/L (ref 98–111)
Chloride: 105 mmol/L (ref 98–111)
Chloride: 100 mmol/L (ref 98–111)
Chloride: 93 mmol/L — ABNORMAL LOW (ref 98–111)
Creatinine, Ser: 0.89 mg/dL (ref 0.44–1.00)
Creatinine, Ser: 0.86 mg/dL (ref 0.44–1.00)
Creatinine, Ser: 1.28 mg/dL — ABNORMAL HIGH (ref 0.44–1.00)
Creatinine, Ser: 1.41 mg/dL — ABNORMAL HIGH (ref 0.44–1.00)
GFR, Estimated: >60 mL/min (ref >60)
GFR, Estimated: >60 mL/min (ref >60)
GFR, Estimated: 42 mL/min — ABNORMAL LOW (ref >60)
GFR, Estimated: 38 mL/min — ABNORMAL LOW (ref >60)
Glucose, Bld: 205 mg/dL — ABNORMAL HIGH (ref 70–99)
Glucose, Bld: 192 mg/dL — ABNORMAL HIGH (ref 70–99)
Glucose, Bld: 192 mg/dL — ABNORMAL HIGH (ref 70–99)
Glucose, Bld: 159 mg/dL — ABNORMAL HIGH (ref 70–99)
Potassium: 3.4 mmol/L — ABNORMAL LOW (ref 3.5–5.1)
Potassium: 5.8 mmol/L — ABNORMAL HIGH (ref 3.5–5.1)
Potassium: 4.4 mmol/L (ref 3.5–5.1)
Potassium: 4.8 mmol/L (ref 3.5–5.1)
Sodium: 132 mmol/L — ABNORMAL LOW (ref 135–145)
Sodium: 127 mmol/L — ABNORMAL LOW (ref 135–145)
Sodium: 129 mmol/L — ABNORMAL LOW (ref 135–145)
Sodium: 133 mmol/L — ABNORMAL LOW (ref 135–145)

## 2021-12-07 LAB — CBC
HCT: 41.8 % (ref 36.0–46.0)
Hemoglobin: 13.4 g/dL (ref 12.0–15.0)
MCH: 31.2 pg (ref 26.0–34.0)
MCHC: 32.1 g/dL (ref 30.0–36.0)
MCV: 97.2 fL (ref 80.0–100.0)
Platelets: 138 10*3/uL — ABNORMAL LOW (ref 150–400)
RBC: 4.3 MIL/uL (ref 3.87–5.11)
RDW: 13.5 % (ref 11.5–15.5)
WBC: 24.4 10*3/uL — ABNORMAL HIGH (ref 4.0–10.5)
nRBC: 0.2 % (ref 0.0–0.2)

## 2021-12-07 LAB — MAGNESIUM: Magnesium: 1.4 mg/dL — ABNORMAL LOW (ref 1.7–2.4)

## 2021-12-07 LAB — TRIGLYCERIDES: Triglycerides: 93 mg/dL (ref <150)

## 2021-12-07 LAB — PHOSPHORUS
Phosphorus: 3.3 mg/dL (ref 2.5–4.6)
Phosphorus: 3.7 mg/dL (ref 2.5–4.6)

## 2021-12-07 LAB — LACTIC ACID, PLASMA
Lactic Acid, Venous: 1.1 mmol/L (ref 0.5–1.9)
Lactic Acid, Venous: 4.2 mmol/L (ref 0.5–1.9)
Lactic Acid, Venous: 3.1 mmol/L (ref 0.5–1.9)

## 2021-12-07 MED ORDER — TRAVASOL 10 % IV SOLN
INTRAVENOUS | Status: AC
  Filled 2021-12-07: qty 653.4

## 2021-12-07 MED ORDER — VANCOMYCIN VARIABLE DOSE PER UNSTABLE RENAL FUNCTION (PHARMACIST DOSING): Status: DC

## 2021-12-07 MED ORDER — ACETAMINOPHEN 10 MG/ML IV SOLN
1000.0000 mg | Freq: Once | INTRAVENOUS | Status: AC
  Administered 2021-12-07: 1000 mg via INTRAVENOUS
  Filled 2021-12-07: qty 100

## 2021-12-07 MED ORDER — HEPARIN SODIUM (PORCINE) 5000 UNIT/ML IJ SOLN
5000.0000 [IU] | Freq: Three times a day (TID) | INTRAMUSCULAR | Status: DC
  Administered 2021-12-07 – 2021-12-10 (×9): 5000 [IU] via SUBCUTANEOUS
  Filled 2021-12-07 (×9): qty 1

## 2021-12-07 MED ORDER — ORAL CARE MOUTH RINSE: 15.0000 mL | OROMUCOSAL | Status: DC | PRN

## 2021-12-07 MED ORDER — INSULIN GLARGINE-YFGN 100 UNIT/ML ~~LOC~~ SOLN
5.0000 [IU] | Freq: Every day | SUBCUTANEOUS | Status: DC
  Administered 2021-12-07 – 2021-12-09 (×3): 5 [IU] via SUBCUTANEOUS
  Filled 2021-12-07 (×5): qty 0.05

## 2021-12-07 MED ORDER — CALCIUM GLUCONATE-NACL 1-0.675 GM/50ML-% IV SOLN
1.0000 g | Freq: Once | INTRAVENOUS | Status: AC
  Administered 2021-12-07: 1000 mg via INTRAVENOUS
  Filled 2021-12-07: qty 50

## 2021-12-07 MED ORDER — SODIUM CHLORIDE 0.9 % IV SOLN
2.0000 g | INTRAVENOUS | Status: DC
  Administered 2021-12-08 – 2021-12-09 (×2): 2 g via INTRAVENOUS
  Filled 2021-12-07 (×2): qty 12.5

## 2021-12-07 MED ORDER — POTASSIUM CHLORIDE 10 MEQ/100ML IV SOLN
10.0000 meq | INTRAVENOUS | Status: AC
  Administered 2021-12-07 (×2): 10 meq via INTRAVENOUS
  Filled 2021-12-07 (×3): qty 100

## 2021-12-07 MED ORDER — CALCIUM GLUCONATE-NACL 2-0.675 GM/100ML-% IV SOLN
2.0000 g | Freq: Once | INTRAVENOUS | Status: AC
  Administered 2021-12-07: 2000 mg via INTRAVENOUS
  Filled 2021-12-07: qty 100

## 2021-12-07 MED ORDER — LEVOTHYROXINE SODIUM 100 MCG/5ML IV SOLN: 50.0000 ug | Freq: Every day | INTRAVENOUS | Status: DC

## 2021-12-07 MED ORDER — STERILE WATER FOR INJECTION IV SOLN
INTRAVENOUS | Status: DC
  Filled 2021-12-07: qty 150
  Filled 2021-12-07: qty 1000
  Filled 2021-12-07: qty 150
  Filled 2021-12-07: qty 1000

## 2021-12-07 MED ORDER — MAGNESIUM SULFATE 4 GM/100ML IV SOLN
4.0000 g | Freq: Once | INTRAVENOUS | Status: AC
  Administered 2021-12-07: 4 g via INTRAVENOUS
  Filled 2021-12-07: qty 100

## 2021-12-07 MED ORDER — ORAL CARE MOUTH RINSE: 15.0000 mL | OROMUCOSAL | Status: DC

## 2021-12-07 MED ORDER — ORAL CARE MOUTH RINSE
15.0000 mL | OROMUCOSAL | Status: DC
  Administered 2021-12-07 – 2021-12-11 (×54): 15 mL via OROMUCOSAL

## 2021-12-07 NOTE — Progress Notes (Signed)
PT Cancellation Note  Patient Details Name: Kristi Erickson MRN: [id number] DOB: [birth year]   Cancelled Treatment:    Reason Eval/Treat Not Completed: Patient not medically ready (intubated and sedated)  Lillia Pauls, PT, DPT Acute Rehabilitation Services Office [phone]    Kristi Erickson 12/07/2021, 9:49 AM

## 2021-12-07 NOTE — Progress Notes (Signed)
Pharmacy Antibiotic Note  Kristi Erickson is a 81 y.o. female admitted on 12/04/21 with intra-abdominal infection, initiated on Zosyn 11/20-11/28. Pharmacy has been consulted for vancomycin/cefepime dosing now for sepsis. Also with Flagyl ordered per MD.  AKI noted this evening - initial SCr value 1.26 repeated and resulted even higher at 1.41. Will adjust antibiotic doses - vancomycin loading dose given 11/29 at 0053.  Plan: Adjust cefepime to 2g IV q24h Remove standing vancomycin dose from the [facility] and consider checking random level prior to considering re-dosing Flagyl 500mg  IV q12h Monitor clinical progress, c/s, renal function F/u de-escalation plan/LOT   Height: 4\' 11"  (149.9 cm) Weight: 41.1 kg (90 lb 9.7 oz) IBW/kg (Calculated) : 43.2  Temp (24hrs), Avg:98 F (36.7 C), Min:97.5 F (36.4 C), Max:98.4 F (36.9 C)  Recent Labs  Lab 12/02/21 0257 12/02/21 0636 12/03/21 0352 12/04/21 0410 12/05/2021 0257 11/18/2021 1843 11/11/2021 2039 12/07/21 0102 12/07/21 0310 12/07/21 0623 12/07/21 0848 12/07/21 1420  WBC 14.5*  --  13.2*  --  21.0* 4.0  --   --   --   --  24.4*  --   CREATININE  --    < > 0.90   < > 0.73  --  0.64  --  0.89 0.86  --  1.28*  LATICACIDVEN  --   --   --   --   --  2.5*  --  1.1  --   --  4.2* 3.1*   < > = values in this interval not displayed.     Estimated Creatinine Clearance: 22.7 mL/min (A) (by C-G formula based on SCr of 1.28 mg/dL (H)).    Allergies  Allergen Reactions   Shellfish-Derived Products Nausea And Vomiting    Clam, oyster   Remeron [Mirtazapine] Rash     12/09/21, PharmD, BCPS Please check AMION for all [facility] contact numbers Clinical Pharmacist 12/07/2021 8:52 PM

## 2021-12-07 NOTE — Progress Notes (Signed)
NAME:  Kristi Erickson, MRN:  [id number], DOB:  04-24-1940, LOS: 9 ADMISSION DATE:  12/06/2021, CONSULTATION DATE:  11/28 REFERRING MD:  Dr. Derrell Lolling, CHIEF COMPLAINT:  Perf sigmoid colon; shock   History of Present Illness:  Patient is 81 year old female with pertinent PMH of Turner syndrome, HTN, HLD, hypothyroidism, chronic stomach problems presents to [facility] ED on 11/20 with nausea and abdominal pain.  Patient admitted to [facility] ED on 11/20 with abdominal pain.  CT abdomen/pelvis showing small pneumoperitoneum with concern for bowel perforation.  WBC 37.  Surgery consulted.  Patient thought to have bowel obstruction.  Made n.p.o. given IV fluids.  NG tube on low intermittent suction.  Culture sent and started on antibiotics.  On 11/28 patient with abdominal distention.  Abdominal x-ray showing dilated small bowel but minimal.  Patient taken to the OR for surgery for ex lap.  Found to have perforated sigmoid colon.  Bowel resection performed and placing ostomy.  Patient now on multiple pressors: Vaso and levo.  Patient going to ICU post OR.  PCCM consulted.  Pertinent  Medical History   Past Medical History:  Diagnosis Date   Acute blood loss anemia    Anxiety    Ascending aortic aneurysm (HCC)    Depression    Diverticulitis    GERD (gastroesophageal reflux disease)    Glaucoma    Heart murmur    History of hiatal hernia    Hyperlipidemia    Hypertension    Hypokalemia    Hypothyroidism    IBS (irritable bowel syndrome)    Insomnia    Leukocytosis    Osteoporosis    Physical deconditioning    Stomach problems    Turner's syndrome    Vision disorder    Vitamin D deficiency      Significant Hospital Events: Including procedures, antibiotic start and stop dates in addition to other pertinent events   11/20: admitted to [facility] abdominal pain; possible bowel obstruction 11/28: increased bowel distention; went to OR; perf sigmoid colon; received bowel resection and ostomy creation.  Probably aspirated during case.  Interim History / Subjective:  Remains intubated, on fentanyl gtt.  Afebrile overnight.   Objective   Blood pressure (!) 98/56, pulse (!) 101, temperature (!) 97.5 F (36.4 C), temperature source Axillary, resp. rate 16, height 4\' 11"  (1.499 m), weight 41.1 kg, SpO2 96 %.    Vent Mode: PRVC FiO2 (%):  [50 %-100 %] 50 % Set Rate:  [18 bmp] 18 bmp Vt Set:  [350 mL] 350 mL PEEP:  [5 cmH20] 5 cmH20 Plateau Pressure:  [12 cmH20-15 cmH20] 12 cmH20   Intake/Output Summary (Last 24 hours) at 12/07/2021 0749 Last data filed at 12/07/2021 0600 Gross per 24 hour  Intake 2866.97 ml  Output 530 ml  Net 2336.97 ml    Filed Weights   11/29/21 0729 12/02/21 1648 01/03/2022 2230  Weight: 45 kg 50.6 kg 41.1 kg    Examination: General: Critically ill-appearing woman lying in bed no acute distress HEENT: [place]/AT, eyes anicteric.  Endotracheal tube in place. Neuro: RASS -5, breathing above the vent.  Pupils reactive CV: S1-S2, RRR PULM: Breathing comfortably on mechanical ventilation, mild dyssynchrony.  Clear to auscultation bilaterally anteriorly, reduced breath sounds in the bases. GI: Soft, nondistended.  Ostomy with minimal thin fluid output.  Dressing over incision. Extremities: Mild pedal edema, no cyanosis Skin: Warm, dry.  Few petechiae, bruising around right arm.  Mottled kneecaps.  Left lung ultrasound-some consolidation, minimal pleural fluid.  Ascites in left upper quadrant.  Ca+ 5.6 Na+ 127 K+ 5.8 Bicarb 14 BUN 38 Cr 0.86 CXR: left effusion vs infiltrate, ETT ~2.5 cm above carina  Resolved Hospital Problem list     Assessment & Plan:  Septic shock due to peritonitis Perforated sigmoid colon s/p bowel resection and ostomy placement on 11/28 Small volume pneumoperitoneum Likely left lower lobe aspiration pneumonia versus pneumonitis -NPO, bowel rest, TPN -appreciate surgery's management -ostomy care -wound care per surgery's  recommendations -vanc, cefepime, flagyl -pressors to maintain MAP >65, on NE currently. -collect trach aspirate; no obvious large fluid collection to drain  Hypocalcemia -aggressive repletion -recheck this afternoon  Severe metabolic acidosis -check LA -start bicarb gtt  Postop mechanical vent management Acute respiratory failure with hypoxia L pleural effusion vs aspiration pneumonia -LTVV -VAP prevention protocol -PAD protocol -daily SAT & SBT  Hyponatremia Hyperkalemia after aggressive repletion AKI: improved -strict I/O -renally dose meds, avoid nephrotoxic meds -monitor -repeat BMP this afternoon, if K+ still rising will need to shift, but it should improve with correction of acidosis -maintain adequate renal perfusion  Acute post-op anemia -transfuse for Hb <7 or hemodynamically significant bleeding -monitor  Acute urinary retention -con't foley, urecholine  HTN, HLD -holding statin while on bowel rest -hold antihypertensives until off pressors  Hypothyroidism -IV synthroid until able to take PO> dose adjusted per pharmacy  Hyperglycemia -SSI PRN -adding semglee 5 units daily -goal BG 140-180  Severe hypophosphatemia -repleted -monitor   Depression -hold PTA mirtazapine  Best Practice (right click and "Reselect all SmartList Selections" daily)   Diet/type: NPO and TPN DVT prophylaxis: prophylactic heparin   GI prophylaxis: PPI Lines: Central line Foley:  Yes, and it is still needed Code Status:  DNR Last date of multidisciplinary goals of care discussion [11/28 updated brother Elijah Birk over phone. Said he won't be able to come in person until Thursday.]  Labs   CBC: Recent Labs  Lab 12/01/21 0907 12/02/21 0257 12/03/21 0352 11/17/2021 0257 11/09/2021 1843 11/25/2021 2128  WBC 19.1* 14.5* 13.2* 21.0* 4.0  --   NEUTROABS 17.0* 12.6* 11.1* 18.8* 3.3  --   HGB 14.6 14.0 13.7 13.2 10.8* 11.9*  HCT 45.1 40.9 40.2 39.7 33.8* 35.0*  MCV 94.2 92.3  91.6 93.0 95.2  --   PLT PLATELET CLUMPS NOTED ON SMEAR, UNABLE TO ESTIMATE 235 223 173 147*  --      Basic Metabolic Panel: Recent Labs  Lab 12/02/21 0636 12/03/21 0352 12/03/21 2018 12/04/21 0410 12/05/21 0500 12/04/2021 0257 11/18/2021 2039 12/05/2021 2128 12/07/21 0310 12/07/21 0623  NA 142 143  --  143 141 137 137 136 132* 127*  K 3.3* 3.7  --  3.1* 3.0* 3.4* 2.8* 3.7 3.4* 5.8*  CL 104 107  --  105 106 102 109  --  105 105  CO2 27 28  --  27 29 25  19*  --  18* 14*  GLUCOSE 149* 122*  --  136* 152* 149* 173*  --  205* 192*  BUN 35* 26*  --  18 21 30* 33*  --  39* 38*  CREATININE 0.86 0.90  --  0.75 0.79 0.73 0.64  --  0.89 0.86  CALCIUM 8.1* 8.7*  --  8.1* 8.1* 8.1* 6.2*  --  6.1* 5.6*  MG 2.2 2.2  --  2.0 1.8 2.2  --   --   --   --   PHOS  --  <1.0* 2.4* 4.4 2.7 2.7  --   --   --   --  GFR: Estimated Creatinine Clearance: 33.9 mL/min (by C-G formula based on SCr of 0.86 mg/dL). Recent Labs  Lab 12/02/21 0257 12/03/21 0352 January 02, 2022 0257 01/02/2022 1843 12/07/21 0102  WBC 14.5* 13.2* 21.0* 4.0  --   LATICACIDVEN  --   --   --  2.5* 1.1     This patient is critically ill with multiple organ system failure which requires frequent high complexity decision making, assessment, support, evaluation, and titration of therapies. This was completed through the application of advanced monitoring technologies and extensive interpretation of multiple databases. During this encounter critical care time was devoted to patient care services described in this note for 41 minutes.  Steffanie Dunn, DO 12/07/21 9:23 AM [facility]  For contact information, see Amion. If no response to pager, please call PCCM consult pager. After hours, 7PM- 7AM, please call Elink.

## 2021-12-07 NOTE — Progress Notes (Signed)
eLink Physician-Brief Progress Note Patient Name: Kristi Erickson DOB: [birth year] MRN: [id number]   Date of Service  12/07/2021  HPI/Events of Note  Potassium 3.4, Calcium 6.1  eICU Interventions  Repletion with calcium gluconate, potassium chloride IV ordered. Will continue to monitor.         Daymein Nunnery M DELA CRUZ 12/07/2021, 4:08 AM

## 2021-12-07 NOTE — Consult Note (Signed)
WOC Nurse ostomy consult note Pt had colostomy surgery performed yesterday.  She is critically ill and no family members are at the bedside.  Pouch intact with good seal, no stool or flatus.  Stoma visualized through the pouch is red and viable, flush with skin level.  5 sets of each supply ordered to the room for staff nurse's use.   Use supplies:  barrier ring, Lawson # [id number], and convex Gigi Gin # [phone] Enrolled patient in [facility] DC program: NOT YET WOC team will begin teaching sessions when Pt is stable and out of ICU.  Thank-you,  Cammie Mcgee MSN, RN, CWOCN, [place], CNS [phone]

## 2021-12-07 NOTE — Progress Notes (Signed)
12/29/21 Pt arrived to unit at 2030 from PACU. 2039 Stat labs were drawn by phlebotomy. 2122 Critical calcium was called and was reported to Elink.  Potassium was noted to be low (2.8) so Elink was notified. No orders were given for low calcium or to replace potassium. 2338 Notified Elink that potassium on ABG was 3.7 so a STAT BMP was ordered to confirm K. 12/07/2021 0046 Called phlebotomy to make sure they saw the order for STAT lab 0103 BMP was drawn by phlebotomy.  0115 BMP sample was tubed down. 0157 Called lab for an update on lab. They said they did not have the lab  0211 Phlebotomy was called to redraw sample. 0234 New sample was presumed to be diluted according to notification from lab. 0300 2H Nurse redrew BMP and was tubed to lab 0357 Notified by lab that sample was good. Critical Calcium of 6.1 was reported to Elink. Potassium was 3.4. Also notified Elink of UOP decreasing. Awaiting orders.

## 2021-12-07 NOTE — Progress Notes (Addendum)
PHARMACY - TOTAL PARENTERAL NUTRITION CONSULT NOTE   Indication:  prolonged NPO status due to nausea/vomiting in the setting of pneumoperitoneum and constipation   Patient Measurements: Height: _0  (149.9 cm) Weight: 41.1 kg (90 lb 9.7 oz) IBW/kg (Calculated) : 43.2 TPN AdjBW (KG): 41.1 Body mass index is 18.3 kg/m. Usual Weight: 48kg, ~ 105/106 lbs per patient   Assessment: Patient with PMH notable for aortic insufficiency, hypertension, HLD, hypothyroidism, Turner syndrome, horseshoe kidney presented with initial concern for a small bowel obstruction. Patient reports no bowel movement in 10 days. Throughout this time she has had poor oral intake saying she only ate 1-2 crackers per day prior to admission. Prior to this, patient had adequate oral intake stating she ate 3 meals per day. Average meal included protein, carbs, and some fat (for ex: breakfast: 1-2 eggs, sausage, and english muffin). General surgery consulted for pneumoperitoneum seen on CT scan. Initial plan was medical management however patient had decompensation on 11/28 requiring intervention. Patient had perforated sigmoid colon on 11/28 that required ex-lap.   Of note, patient has shellfish allergy reported. Patient confirms allergy in the chart with reported reaction of nausea and vomiting. Patient reports that she has tolerated other fish besides oysters and scallops. No anaphylaxis reported in the past.   Glucose / Insulin: no history of diabetes noted, last A1C from several years ago 5.5%  Blood sugars elevated likely stress response from surgery, range 152-216 , utilized 9 u/24h sensitive SSI  Semglee 5 units added per MD.  Electrolytes: phos: 3.3, K: 5.8 (s/p 56mq this AM, 242m on 11/28), Na: 127, Mag: 1.4, CoCa: 7.3 ionized calcium 1.10 (s/p 1g calcium gluconate), HCO: 14, Cl: 105 Renal: Scr: 0.86, BUN:38 Hepatic: AST/ALT WNL, Alk phos: 83 (11/26), TG 93, albumin: 1.9, T-bili: 0.4 Intake / Output; MIVF: UOP:  0.84m64mg/hr, LBM 11/27  GI Imaging: 11/20: CT abdomen, small pneumoperitoneum  11/26 Abd x-ray - Stable small bowel dilatation is noted concerning for distal small bowel obstruction or possibly ileus  11/27 Abd x-ray -Persistent small bowel dilation concerning for small bowel obstruction. GI Surgeries / Procedures:  11/28: Ex-lap with hartmann, perf sigmoid colon, bowel resection and ostomy placement    Central access: PICC line placed 11/25  TPN start date: 11/25   Nutritional Goals: Goal TPN rate is 55 mL/hr (provides 65 g of protein and 1350 kcals per day (30kcal/kg) Estimated Needs Total Energy Estimated Needs: 1350-1575 kcals Total Protein Estimated Needs: 65-80 gm Total Fluid Estimated Needs: >/= 1.3 L  Current Nutrition:  TPN  NPO   Plan:  Continue TPN to goal of 584m84m at 1800, will meet approximately 100% of nutritional needs providing 1350kcal and 65g AA.  Patient was on propofol but is off now, Per CCM plan to monitor off sedation with PRN fentanyl if needed. Will leave lipids within TPN.  Electrolytes in TPN: Increase Na 100mE73m(previously 50mEq69m Decrease K 30mEq/64mow 40mEq i584mN, previously 72mEq to37min TPN + 40mEq out36m TPN, increased on 11/26 and additionally on 11/28), Increase Ca 7mEq/L (wa60mmEq/L), In57mase Mg 10mEq/L (pre31msly 7mEq/L, last 68mreased on 11/27), and continue Phos 22mmol/L. From20m Cl Acetate to max acetate as bicarb has dropped significantly in past 24 hours. Bicarb infusion started per primary team.  4g calcium gluconate per MD HyperK suspected from aggressive repletion within the past 24 hours. K likely to decrease with improvement in acidosis and bicarb gtt.  Will supplement with 4g mag sulfate outside TPN.  Add standard MVI  and trace elements to TPN Continue SSI to sensitive q4h SSI at 1800, Semglee 5u added by MD.  Monitor TPN labs on Mon/Thurs, daily until stabilization  Esmeralda Arthur, PharmD, BCCCP  Clinical  Pharmacist Please see AMION for all Pharmacists' Contact Phone Numbers 12/07/2021, 8:22 AM

## 2021-12-07 NOTE — Progress Notes (Addendum)
1 Day Post-Op   Subjective/Chief Complaint: Intubated, sedated   Objective: Vital signs in last 24 hours: Temp:  [97.5 F (36.4 C)-98.3 F (36.8 C)] 98.2 F (36.8 C) (11/29 0800) Pulse Rate:  [78-105] 101 (11/29 0645) Resp:  [13-24] 16 (11/29 0700) BP: (71-119)/(44-73) 98/56 (11/29 0700) SpO2:  [90 %-100 %] 96 % (11/29 0645) FiO2 (%):  [50 %-100 %] 50 % (11/29 0321) Weight:  [41.1 kg] 41.1 kg (11/28 2230) Last BM Date : 12/05/21  Intake/Output from previous day: 11/28 0701 - 11/29 0700 In: 2867 [I.V.:1130.9; IV Piggyback:1736.1] Out: 530 [Urine:530] Intake/Output this shift: No intake/output data recorded.  PE: Constitutional: on vent, sedated Lungs: Clear to auscultation bilaterally, normal respiratory effort on vent CV: regular rhythm, mildly tachy GI: Soft, distended, NGT with minimal bilious output, midline wound clean and packed.  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 ABG Recent Labs    11/14/2021 2128  PHART 7.343*  HCO3 21.3    Studies/Results: DG CHEST PORT 1 VIEW  Result Date: 12/07/2021 CLINICAL DATA:  History of pneumoperitoneum secondary to sigmoid colon perforation. Status post intubation. EXAM: PORTABLE CHEST 1 VIEW COMPARISON:  Chest radiograph dated 11/27/2021 FINDINGS: Lines/tubes: Endotracheal tube tip projects 1.3 cm above the carina. Enteric tube tip is directed superiorly and projects over the left upper quadrant. Right upper extremity PICC tip projects over the superior cavoatrial junction. Surgical clips project over  the epigastrium. Chest: Low lung volumes with bibasilar patchy opacities, likely atelectasis. Increased dense left retrocardiac opacities. Pleura: Increased small left pleural effusion. Trace right pleural effusion. No pneumothorax. Heart/mediastinum: Similar  cardiomediastinal silhouette. Bones: Median sternotomy wires are nondisplaced. Similar coarse splenic calcifications over the left upper quadrant. IMPRESSION: 1. Endotracheal tube tip projects 1.3 cm above the carina. 2. Increased small left pleural effusion. Trace right pleural effusion. 3. Low lung volumes with bibasilar atelectasis. Increased dense left retrocardiac opacities, likely atelectasis. Electronically Signed   By: Agustin Cree M.D.   On: 12/07/2021 08:20   Portable Chest x-ray  Result Date: 12/01/2021 CLINICAL DATA:  Endotracheal tube EXAM: PORTABLE CHEST 1 VIEW COMPARISON:  08/03/2015 FINDINGS: Endotracheal tube tip just above the carina. Esophageal tube tip in the left upper quadrant. Right upper extremity central venous catheter tip over the SVC. Post sternotomy changes. Low lung volumes. Consolidation at left base with probable small pleural effusions. Normal cardiac size. Aortic atherosclerosis. IMPRESSION: 1. Support lines and tubes as above. Endotracheal tube tip just above the carina. 2. Low lung volumes with consolidation at left base and probable small pleural effusions. Electronically Signed   By: Jasmine Pang M.D.   On: 11/18/2021 17:49   [place] EKG SITE RITE  Result Date: 11/16/2021 If Site Rite image not attached, placement could not be confirmed due to current cardiac rhythm.  DG Abd Portable 1V  Result Date: 11/09/2021 CLINICAL DATA:  Small-bowel obstruction. EXAM: PORTABLE ABDOMEN - 1 VIEW COMPARISON:  KUB 1 day  prior. FINDINGS: Gaseous distention of the bowel is unchanged. There is no definite free intraperitoneal air, within the confines of supine technique. Abdominal surgical clips are unchanged. There is no acute  osseous abnormality. IMPRESSION: Gaseous distention of the bowel is unchanged. Electronically Signed   By: Lesia Hausen M.D.   On: 23-Dec-2021 08:03   DG Abd Portable 1V  Result Date: 12/05/2021 CLINICAL DATA:  Small-bowel obstruction EXAM: PORTABLE ABDOMEN - 1 VIEW COMPARISON:  Radiograph 12/04/2021 FINDINGS: Persistent small bowel dilation, measuring up to 5.4 cm in, similar prior. Surgical clips overlie the mid abdomen. Multilevel degenerative changes of spine. IMPRESSION: Persistent small bowel dilation concerning for small bowel obstruction. Electronically Signed   By: Caprice Renshaw M.D.   On: 12/05/2021 10:43    Anti-infectives: Anti-infectives (From admission, onward)    Start     Dose/Rate Route Frequency Ordered Stop   12/23/2021 1900  metroNIDAZOLE (FLAGYL) IVPB 500 mg        500 mg 100 mL/hr over 60 Minutes Intravenous Every 12 hours 12/23/2021 1730     12-23-21 1815  ceFEPIme (MAXIPIME) 2 g in sodium chloride 0.9 % 100 mL IVPB        2 g 200 mL/hr over 30 Minutes Intravenous Every 12 hours 2021-12-23 1811     2021/12/23 1815  vancomycin (VANCOREADY) IVPB 1250 mg/250 mL        1,250 mg 166.7 mL/hr over 90 Minutes Intravenous Every 48 hours 12-23-2021 1811     12/02/21 2200  piperacillin-tazobactam (ZOSYN) IVPB 3.375 g  Status:  Discontinued        3.375 g 12.5 mL/hr over 240 Minutes Intravenous Every 8 hours 12/02/21 1340 12-23-2021 1730   11/29/21 0600  piperacillin-tazobactam (ZOSYN) IVPB 2.25 g  Status:  Discontinued        2.25 g 100 mL/hr over 30 Minutes Intravenous Every 8 hours 11/25/2021 2342 12/02/21 1340   11/24/2021 2345  piperacillin-tazobactam (ZOSYN) IVPB 3.375 g  Status:  Discontinued        3.375 g 12.5 mL/hr over 240 Minutes Intravenous  Once 11/25/2021 2330 11/29/21 0003   11/24/2021 2115  piperacillin-tazobactam (ZOSYN) IVPB 3.375 g        3.375 g 100 mL/hr over 30 Minutes Intravenous  Once 12/06/2021 2113 11/12/2021 2345       Assessment/Plan: POD 1, s/p ex lap with  Hartmann's for perforated sigmoid colon with contained fluid collection and SBO, Dr. Derrell Lolling 11/28 - cont NGT for decompression -labs reviewed today and as expected for the most part. CBC pending -zosyn DC yesterday and transitioned to broad-spectrum per CCM to cefepime, flagyl, vanc.  Suspect vanc could be stopped relatively quickly if not needed per CCM and no other source for this. -BID WD dressing changes to midline wound -WOC following for new colostomy -await bowel function -cont TNA for nutritional support   FEN: NPO/NGT/fluids per primary/TNA ID: zosyn, Dc 11/28, Cefepime, Flagyl/vanc 11/28 --> VTE: SCDs, Heparin subq   Per primary: Acute hypoxic respiratory failure post op - likely remain intubated today to correct bicarb and give some rest.  AKI - resolved, 530cc in last 24 hrs Anxiety/depression GERD HLD HTN Hypothyroidism IBS Turner syndrome hard of hearing  History of ascending and arch aneurysm with graft 2017 H/o prior abdominal surgery, at least surgery for ovarian cyst but she is unable to remember further surgical history      LOS: 9 days    Kristi Erickson 12/07/2021

## 2021-12-07 NOTE — Progress Notes (Signed)
BMP checked in the arm with Mg+ and calcium running (paused. Now calcium undetectably high. Suspect this is an erroneous lab draw. 8pm repeat BMP.  Steffanie Dunn, DO 12/07/21 3:31 PM [facility]

## 2021-12-07 NOTE — Progress Notes (Signed)
Nutrition Follow-up  DOCUMENTATION CODES:  Non-severe (moderate) malnutrition in context of acute illness/injury  INTERVENTION:  Recommend ~10% increase in calories from TPN due to weight loss during admission. Maintain volume as feasible: volume, 80g protein (1.95g/kg), 215g dextrose (GIR 3.5, 49% calories), 45g lipids (30% calories) to provide 1501kcal/day.  Monitor lytes, replete prn Monitor BG, adjust insulin Monitor for return of bowel function to begin enteral nutrition (tube feeding vs po diet)  NUTRITION DIAGNOSIS:  Moderate Malnutrition related to acute illness as evidenced by energy intake < or equal to 50% for > or equal to 5 days, moderate fat depletion, moderate muscle depletion.  GOAL:  Patient will meet greater than or equal to 90% of their needs -TPN, progressing  MONITOR:  Diet advancement, Labs, I & O's, Weight trends  REASON FOR ASSESSMENT:  Consult New TPN/TNA  ASSESSMENT:  81 y.o. female admits related to constipation. PMH includes: ascending aortic aneurysm, diverticulitis, GERD, glaucoma, hiatal hernia, HLD, HTN, hypokalemia, hypothyroidism, IBS, osteoporosis, Turner's syndrome. Pt is currently receiving medical management for pneumoperitoneum.  11/21 - NGT placed 11/24 - NGT replaced 11/25: TPN started 11/28: ex lap w/ hartman's procedure for perforated sigmoid colon  Awaiting return of bowel function following colostomy placement. Continue TPN. Pt remains intubated. On levophed at 29mcg/min. MAP>65. Weight trending down during admission, recommend increasing calories and protein to prevent further weight loss and malnutrition.  Admission weight:45kg Weight 11/19/2021: 41.1kg  Labs reviewed: Na:127, K:5.8, BG:192-216, BUN:38, Ca:5.6, Phos:3.3, Mg:1.4, GFR>60, lactic acid:4.2  Current TPN order: (57mL/hr) w/ 65.34g protein (1.6g/kg, 19% calories), 201g dextrose (GIR 3.4), and 40.4g lipids (30% calorie needs) to provide 1350kcal.  Recommend  ~10% increase in calorie in TPN while maintaining volume. Recommend 80g protein (1.95g/kg), 215g dextrose (GIR 3.5, 49% calories), 45g lipids (30% calories) to provide 1501kcal/day. Continue to monitor BG and lytes.  Medications reviewed and include: novolog ssi q 4hrs, semglee, levothyroxine, protonix, fentanyl, flagyl, levophed, sodium bicarb, TPN   Diet Order:   Diet Order             Diet NPO time specified  Diet effective midnight                   EDUCATION NEEDS:  No education needs have been identified at this time  Skin:  Skin Assessment: Reviewed RN Assessment   Height:  Ht Readings from Last 1 Encounters:  12/04/2021 4\' 11"  (1.499 m)   Weight:  Wt Readings from Last 1 Encounters:  11/13/2021 41.1 kg     BMI:  Body mass index is 18.3 kg/m.  Estimated Nutritional Needs:  Kcal:  1350-1575 kcals Protein:  65-80 gm Fluid:  >/= 1.3 L  12/08/21, MS, RD, LDN, CNSC See AMiON for contact information

## 2021-12-08 DIAGNOSIS — A419 Sepsis, unspecified organism: Secondary | ICD-10-CM | POA: Diagnosis not present

## 2021-12-08 DIAGNOSIS — R6521 Severe sepsis with septic shock: Secondary | ICD-10-CM | POA: Diagnosis not present

## 2021-12-08 DIAGNOSIS — K668 Other specified disorders of peritoneum: Secondary | ICD-10-CM | POA: Diagnosis not present

## 2021-12-08 DIAGNOSIS — Z9911 Dependence on respirator [ventilator] status: Secondary | ICD-10-CM | POA: Diagnosis not present

## 2021-12-08 LAB — COMPREHENSIVE METABOLIC PANEL
ALT: 22 U/L (ref 0–44)
AST: 36 U/L (ref 15–41)
Albumin: <1.5 g/dL — ABNORMAL LOW (ref 3.5–5.0)
Alkaline Phosphatase: 48 U/L (ref 38–126)
Anion gap: 11 (ref 5–15)
BUN: 50 mg/dL — ABNORMAL HIGH (ref 8–23)
CO2: 31 mmol/L (ref 22–32)
Calcium: 7.2 mg/dL — ABNORMAL LOW (ref 8.9–10.3)
Chloride: 88 mmol/L — ABNORMAL LOW (ref 98–111)
Creatinine, Ser: 1.35 mg/dL — ABNORMAL HIGH (ref 0.44–1.00)
GFR, Estimated: 40 mL/min — ABNORMAL LOW (ref >60)
Glucose, Bld: 116 mg/dL — ABNORMAL HIGH (ref 70–99)
Potassium: 4.2 mmol/L (ref 3.5–5.1)
Sodium: 130 mmol/L — ABNORMAL LOW (ref 135–145)
Total Bilirubin: 0.4 mg/dL (ref 0.3–1.2)
Total Protein: 3 g/dL — ABNORMAL LOW (ref 6.5–8.1)

## 2021-12-08 LAB — PHOSPHORUS: Phosphorus: 4.3 mg/dL (ref 2.5–4.6)

## 2021-12-08 LAB — TRIGLYCERIDES: Triglycerides: 67 mg/dL (ref <150)

## 2021-12-08 LAB — CBC
HCT: 30 % — ABNORMAL LOW (ref 36.0–46.0)
Hemoglobin: 10.3 g/dL — ABNORMAL LOW (ref 12.0–15.0)
MCH: 30.9 pg (ref 26.0–34.0)
MCHC: 34.3 g/dL (ref 30.0–36.0)
MCV: 90.1 fL (ref 80.0–100.0)
Platelets: 95 10*3/uL — ABNORMAL LOW (ref 150–400)
RBC: 3.33 MIL/uL — ABNORMAL LOW (ref 3.87–5.11)
RDW: 13.2 % (ref 11.5–15.5)
WBC: 21 10*3/uL — ABNORMAL HIGH (ref 4.0–10.5)
nRBC: 0.1 % (ref 0.0–0.2)

## 2021-12-08 LAB — GLUCOSE, CAPILLARY
Glucose-Capillary: 105 mg/dL — ABNORMAL HIGH (ref 70–99)
Glucose-Capillary: 108 mg/dL — ABNORMAL HIGH (ref 70–99)
Glucose-Capillary: 109 mg/dL — ABNORMAL HIGH (ref 70–99)
Glucose-Capillary: 115 mg/dL — ABNORMAL HIGH (ref 70–99)
Glucose-Capillary: 126 mg/dL — ABNORMAL HIGH (ref 70–99)
Glucose-Capillary: 128 mg/dL — ABNORMAL HIGH (ref 70–99)

## 2021-12-08 LAB — SURGICAL PATHOLOGY

## 2021-12-08 LAB — MAGNESIUM: Magnesium: 2.6 mg/dL — ABNORMAL HIGH (ref 1.7–2.4)

## 2021-12-08 LAB — VANCOMYCIN, RANDOM: Vancomycin Rm: 12 ug/mL

## 2021-12-08 MED ORDER — VANCOMYCIN HCL 500 MG/100ML IV SOLN
500.0000 mg | INTRAVENOUS | Status: DC
  Administered 2021-12-08: 500 mg via INTRAVENOUS
  Filled 2021-12-08: qty 100

## 2021-12-08 MED ORDER — FUROSEMIDE 10 MG/ML IJ SOLN
40.0000 mg | Freq: Once | INTRAMUSCULAR | Status: AC
  Administered 2021-12-08: 40 mg via INTRAVENOUS
  Filled 2021-12-08: qty 4

## 2021-12-08 MED ORDER — TRAVASOL 10 % IV SOLN
INTRAVENOUS | Status: AC
  Filled 2021-12-08: qty 800

## 2021-12-08 MED ORDER — FUROSEMIDE 10 MG/ML IJ SOLN
80.0000 mg | Freq: Once | INTRAMUSCULAR | Status: AC
  Administered 2021-12-08: 80 mg via INTRAVENOUS
  Filled 2021-12-08: qty 8

## 2021-12-08 MED ORDER — HYDROMORPHONE HCL 1 MG/ML IJ SOLN
0.5000 mg | INTRAMUSCULAR | Status: DC | PRN
  Administered 2021-12-08 – 2021-12-09 (×2): 0.5 mg via INTRAVENOUS
  Administered 2021-12-09: 0.25 mg via INTRAVENOUS
  Filled 2021-12-08 (×3): qty 0.5

## 2021-12-08 NOTE — Procedures (Signed)
Extubation Procedure Note  Patient Details:   Name: Kristi Erickson DOB: May 07, 1940 MRN: [id number]   Airway Documentation:    Vent end date: 12/08/21 Vent end time: 0845   Evaluation  O2 sats: stable throughout Complications: No apparent complications Patient did tolerate procedure well. Bilateral Breath Sounds: Clear, Diminished   Yes  Pt extubated to 4l [place] with RN at bedside. Positive cuff leak noted and pt is tolerating well. RT will monitor.  Forest Becker 12/08/2021, 9:01 AM

## 2021-12-08 NOTE — Progress Notes (Signed)
Pt placed on PS/CPAP 8/5 on 40% and is tolerating well. RT will monitor. 

## 2021-12-08 NOTE — Evaluation (Signed)
Physical Therapy Re-Evaluation Patient Details Name: Kristi Erickson MRN: [id number] DOB: 12-05-1940 Today's Date: 12/08/2021  History of Present Illness  81 yo female admitted 11/20/2021 with pneumoperitoneum. Perf sigmoid colon and received bowel resection and colostomy 11/28. ETT 11/28-11/30. PMhx; AKI, HLD, HTN, IBS, GERD, HOH, Turner syndrome.   Clinical Impression  Pt now s/p ex lap for perforated sigmoid colon with bowel resection and colostomy placement 11/28. RN requesting to limit session to bed level exercises due to pt still having an open abdominal incision with dressings. RN also informed therapist of potential plan to rule out bil UE DVTs later, thus held off on functional mobility and UE exercises. Focused session rather on manual resistance training of her bil lower extremities to prevent further deconditioning with prolonged hospitalization. Pt needing repeated cues and extra time to process, but some of these issues could be related to her being [facility]. Pt remains limited by strength and activity tolerance and would benefit from further acute PT services followed by short-term rehab at a SNF to address her deficits and maximize her independence and safety with all functional mobility.     Recommendations for follow up therapy are one component of a multi-disciplinary discharge planning process, led by the attending physician.  Recommendations may be updated based on patient status, additional functional criteria and insurance authorization.  Follow Up Recommendations Skilled nursing-short term rehab (<3 hours/day) Can patient physically be transported by private vehicle: No    Assistance Recommended at Discharge Frequent or constant Supervision/Assistance  Patient can return home with the following  A lot of help with bathing/dressing/bathroom;Assistance with cooking/housework;Help with stairs or ramp for entrance;A lot of help with walking and/or transfers;Assist for  transportation    Equipment Recommendations Other (comment) (TBA)  Recommendations for Other Services       Functional Status Assessment Patient has had a recent decline in their functional status and demonstrates the ability to make significant improvements in function in a reasonable and predictable amount of time.     Precautions / Restrictions Precautions Precautions: Fall Precaution Comments: NG tube; colostomy; open abdominal incision Restrictions Weight Bearing Restrictions: No      Mobility  Bed Mobility               General bed mobility comments: deferred bed mobility as RN requested to hold on OOB mobility due to open abdominal incision and report of plan to perform bil UE vascular ultrasounds later    Transfers                   General transfer comment: deferred bed mobility as RN requested to hold on OOB mobility due to open abdominal incision and report of plan to perform bil UE vascular ultrasounds later    Ambulation/Gait               General Gait Details: deferred bed mobility as RN requested to hold on OOB mobility due to open abdominal incision and report of plan to perform bil UE vascular ultrasounds later  Stairs            Wheelchair Mobility    Modified Rankin (Stroke Patients Only)       Balance                                             Pertinent Vitals/Pain Pain Assessment Pain Assessment: Faces  Faces Pain Scale: Hurts little more Pain Location: abdomen, arms noted by grimacing Pain Descriptors / Indicators: Grimacing Pain Intervention(s): Monitored during session, Limited activity within patient's tolerance, Repositioned    Home Living Family/patient expects to be discharged to:: Private residence Living Arrangements: Alone Available Help at Discharge: Family;Available PRN/intermittently Type of Home: House Home Access: Stairs to enter   [employer] of Steps: 1   Home  Layout: One level Home Equipment: Agricultural consultant (2 wheels)      Prior Function Prior Level of Function : Independent/Modified Independent;Driving                     Hand Dominance   Dominant Hand: Right    Extremity/Trunk Assessment   Upper Extremity Assessment Upper Extremity Assessment: Defer to OT evaluation    Lower Extremity Assessment Lower Extremity Assessment: Generalized weakness (denied numbness/tingling bil; able to lift against gravity bil but noted difficulty)    Cervical / Trunk Assessment Cervical / Trunk Assessment: Kyphotic;Other exceptions Cervical / Trunk Exceptions: open abdominal incision  Communication   Communication: HOH (hears better in L ear with hearing aid in)  Cognition Arousal/Alertness: Awake/alert Behavior During Therapy: Flat affect Overall Cognitive Status: No family/caregiver present to determine baseline cognitive functioning Area of Impairment: Safety/judgement, Awareness, Following commands, Memory, Attention, Problem solving                   Current Attention Level: Sustained Memory: Decreased short-term memory Following Commands: Follows one step commands consistently, Follows one step commands with increased time, Follows multi-step commands inconsistently Safety/Judgement: Decreased awareness of deficits, Decreased awareness of safety Awareness: Intellectual Problem Solving: Slow processing, Requires verbal cues, Requires tactile cues General Comments: Pt with soft spoken voice today, just extubated today. Pt needing extra time to process and initiate movements, could be related to being [facility] though. Pt needing repeated step-by-step cues to follow through fully with each exercise.        General Comments General comments (skin integrity, edema, etc.): VSS    Exercises General Exercises - Lower Extremity Ankle Circles/Pumps: AROM, Strengthening, Both, 10 reps, Supine (x3 with manual resistance from PT into  dorsiflexion and plantarflexion bil) Hip ABduction/ADduction: Strengthening, Both, 10 reps, Supine (against manual resistance from PT) Other Exercises Other Exercises: Heel slides bil with pt pushing leg into extension against manual resistance from PT while supine in bed, 10x   Assessment/Plan    PT Assessment Patient needs continued PT services  PT Problem List Decreased strength;Decreased mobility;Decreased activity tolerance;Decreased balance;Decreased knowledge of use of DME;Decreased cognition;Cardiopulmonary status limiting activity;Decreased skin integrity       PT Treatment Interventions Gait training;Therapeutic exercise;Patient/family education;Balance training;Stair training;Functional mobility training;DME instruction;Therapeutic activities;Neuromuscular re-education;Cognitive remediation    PT Goals (Current goals can be found in the Care Plan section)  Acute Rehab PT Goals Patient Stated Goal: agreeable to exercises to reduce risk of deconditioning PT Goal Formulation: With patient Time For Goal Achievement: 12/15/21 Potential to Achieve Goals: Fair    Frequency Min 2X/week     Co-evaluation               AM-PAC PT "6 Clicks" Mobility  Outcome Measure Help needed turning from your back to your side while in a flat bed without using bedrails?: Total Help needed moving from lying on your back to sitting on the side of a flat bed without using bedrails?: Total Help needed moving to and from a bed to a chair (including a wheelchair)?: Total Help  needed standing up from a chair using your arms (e.g., wheelchair or bedside chair)?: Total Help needed to walk in hospital room?: Total Help needed climbing 3-5 steps with a railing? : Total 6 Click Score: 6    End of Session Equipment Utilized During Treatment: Oxygen Activity Tolerance: Other (comment) (limited by open abdominal incision and need to rule out UE DVTs) Patient left: in bed;with call bell/phone within  reach;with bed alarm set;with SCD's reapplied Nurse Communication: Mobility status;Other (comment) (need for prevalon boots) PT Visit Diagnosis: Other abnormalities of gait and mobility (R26.89);Muscle weakness (generalized) (M62.81);Unsteadiness on feet (R26.81);Difficulty in walking, not elsewhere classified (R26.2)    Time: 6720-9470 PT Time Calculation (min) (ACUTE ONLY): 18 min   Charges:   PT Evaluation $PT Re-evaluation: 1 Re-eval          Raymond Gurney, PT, DPT Acute Rehabilitation Services  Office: [phone]   Kristi Erickson 12/08/2021, 6:16 PM

## 2021-12-08 NOTE — Progress Notes (Signed)
[facility] Progress Note  2 Days Post-Op  Subjective: CC-  Weaning vent, possible extubation today. Down to levo 5 from 16 yesterday NG with minimal bilious output. Colostomy with trace serosanguinous fluid/ no stool Abdominal midline wound dressing changed 3 times over night due to serous drainage  Objective: Vital signs in last 24 hours: Temp:  [98 F (36.7 C)-98.4 F (36.9 C)] 98.3 F (36.8 C) (11/30 0400) Pulse Rate:  [89-126] 114 (11/30 0700) Resp:  [14-27] 18 (11/30 0700) BP: (81-163)/(45-114) 123/60 (11/30 0700) SpO2:  [88 %-97 %] 92 % (11/30 0738) FiO2 (%):  [40 %] 40 % (11/30 0738) Weight:  [45.7 kg] 45.7 kg (11/30 0500) Last BM Date : 12/05/21  Intake/Output from previous day: 11/29 0701 - 11/30 0700 In: 6490 [I.V.:5745.9; IV Piggyback:744.1] Out: 610 [Urine:610] Intake/Output this shift: No intake/output data recorded.  PE: Gen: somewhat alert on the vent CV: tachycardic GI: Soft, distended, NGT with minimal bilious output, midline wound clean with serous drainage.  L-sided colostomy viable with sweat and scant serosanguinous fluid in bag GU: foley with clear yellow urine  Lab Results:  Recent Labs    11/16/2021 1843 11/11/2021 2128 12/07/21 0848  WBC 4.0  --  24.4*  HGB 10.8* 11.9* 13.4  HCT 33.8* 35.0* 41.8  PLT 147*  --  138*   BMET Recent Labs    12/07/21 2002 12/08/21 0640  NA 133* 130*  K 4.8 4.2  CL 93* 88*  CO2 23 31  GLUCOSE 159* 116*  BUN 53* 50*  CREATININE 1.41* 1.35*  CALCIUM 8.3* 7.2*   PT/INR No results for input(s): "LABPROT", "INR" in the last 72 hours. CMP     Component Value Date/Time   NA 130 (L) 12/08/2021 0640   NA 137 07/07/2015 0000   K 4.2 12/08/2021 0640   CL 88 (L) 12/08/2021 0640   CO2 31 12/08/2021 0640   GLUCOSE 116 (H) 12/08/2021 0640   BUN 50 (H) 12/08/2021 0640   BUN 12 07/07/2015 0000   CREATININE 1.35 (H) 12/08/2021 0640   CREATININE 0.69 12/25/2016 1514   CALCIUM 7.2 (L) 12/08/2021  0640   PROT 3.0 (L) 12/08/2021 0640   ALBUMIN <1.5 (L) 12/08/2021 0640   AST 36 12/08/2021 0640   ALT 22 12/08/2021 0640   ALKPHOS 48 12/08/2021 0640   BILITOT 0.4 12/08/2021 0640   GFRNONAA 40 (L) 12/08/2021 0640   GFRNONAA 85 12/25/2016 1514   GFRAA 99 12/25/2016 1514   Lipase     Component Value Date/Time   LIPASE 26 2021-12-12 1800       Studies/Results: DG CHEST PORT 1 VIEW  Result Date: 12/07/2021 CLINICAL DATA:  History of pneumoperitoneum secondary to sigmoid colon perforation. Status post intubation. EXAM: PORTABLE CHEST 1 VIEW COMPARISON:  Chest radiograph dated Dec 12, 2021 FINDINGS: Lines/tubes: Endotracheal tube tip projects 1.3 cm above the carina. Enteric tube tip is directed superiorly and projects over the left upper quadrant. Right upper extremity PICC tip projects over the superior cavoatrial junction. Surgical clips project over the epigastrium. Chest: Low lung volumes with bibasilar patchy opacities, likely atelectasis. Increased dense left retrocardiac opacities. Pleura: Increased small left pleural effusion. Trace right pleural effusion. No pneumothorax. Heart/mediastinum: Similar  cardiomediastinal silhouette. Bones: Median sternotomy wires are nondisplaced. Similar coarse splenic calcifications over the left upper quadrant. IMPRESSION: 1. Endotracheal tube tip projects 1.3 cm above the carina. 2. Increased small left pleural effusion. Trace right pleural effusion. 3. Low lung volumes with bibasilar atelectasis. Increased dense left  retrocardiac opacities, likely atelectasis. Electronically Signed   By: Agustin Cree M.D.   On: 12/07/2021 08:20   Portable Chest x-ray  Result Date: 11/23/2021 CLINICAL DATA:  Endotracheal tube EXAM: PORTABLE CHEST 1 VIEW COMPARISON:  08/03/2015 FINDINGS: Endotracheal tube tip just above the carina. Esophageal tube tip in the left upper quadrant. Right upper extremity central venous catheter tip over the SVC. Post sternotomy changes. Low  lung volumes. Consolidation at left base with probable small pleural effusions. Normal cardiac size. Aortic atherosclerosis. IMPRESSION: 1. Support lines and tubes as above. Endotracheal tube tip just above the carina. 2. Low lung volumes with consolidation at left base and probable small pleural effusions. Electronically Signed   By: Jasmine Pang M.D.   On: 12/03/2021 17:49   [place] EKG SITE RITE  Result Date: 12/02/2021 If Site Rite image not attached, placement could not be confirmed due to current cardiac rhythm.   Anti-infectives: Anti-infectives (From admission, onward)    Start     Dose/Rate Route Frequency Ordered Stop   12/08/21 1400  ceFEPIme (MAXIPIME) 2 g in sodium chloride 0.9 % 100 mL IVPB        2 g 200 mL/hr over 30 Minutes Intravenous Every 24 hours 12/07/21 2139     12/07/21 2139  vancomycin variable dose per unstable renal function (pharmacist dosing)         Does not apply See admin instructions 12/07/21 2139     12/04/2021 1900  metroNIDAZOLE (FLAGYL) IVPB 500 mg        500 mg 100 mL/hr over 60 Minutes Intravenous Every 12 hours 11/22/2021 1730     11/25/2021 1815  ceFEPIme (MAXIPIME) 2 g in sodium chloride 0.9 % 100 mL IVPB  Status:  Discontinued        2 g 200 mL/hr over 30 Minutes Intravenous Every 12 hours 11/15/2021 1811 12/07/21 2139   12/02/2021 1815  vancomycin (VANCOREADY) IVPB 1250 mg/250 mL  Status:  Discontinued        1,250 mg 166.7 mL/hr over 90 Minutes Intravenous Every 48 hours 11/17/2021 1811 12/07/21 2139   12/02/21 2200  piperacillin-tazobactam (ZOSYN) IVPB 3.375 g  Status:  Discontinued        3.375 g 12.5 mL/hr over 240 Minutes Intravenous Every 8 hours 12/02/21 1340 11/11/2021 1730   11/29/21 0600  piperacillin-tazobactam (ZOSYN) IVPB 2.25 g  Status:  Discontinued        2.25 g 100 mL/hr over 30 Minutes Intravenous Every 8 hours 11/20/2021 2342 12/02/21 1340   12/01/2021 2345  piperacillin-tazobactam (ZOSYN) IVPB 3.375 g  Status:  Discontinued        3.375  g 12.5 mL/hr over 240 Minutes Intravenous  Once 12/08/2021 2330 11/29/21 0003   11/19/2021 2115  piperacillin-tazobactam (ZOSYN) IVPB 3.375 g        3.375 g 100 mL/hr over 30 Minutes Intravenous  Once 11/19/2021 2113 11/27/2021 2345        Assessment/Plan POD 2, s/p ex lap with Hartmann's for perforated sigmoid colon with contained fluid collection and SBO, Dr. Derrell Lolling 11/28 -cont NGT for decompression and await return in bowel function -zosyn DC 11/28 and transitioned to broad-spectrum per CCM to cefepime, flagyl, vanc.  Suspect vanc could be stopped relatively quickly if not needed per CCM and no other source for this. -wound clean but with some serous drainage, BID dry dressings today to midline wound -WOC following for new colostomy -cont TNA for nutritional support   FEN: NPO/NGT/fluids per primary/TNA ID: zosyn,  Dc 11/28, Cefepime, Flagyl/vanc 11/28 --> VTE: SCDs, Heparin subq   Per primary: Acute hypoxic respiratory failure post op - possible extubation today AKI - improving Anxiety/depression GERD HLD HTN Hypothyroidism IBS Turner syndrome hard of hearing  History of ascending and arch aneurysm with graft 2017 H/o prior abdominal surgery, at least surgery for ovarian cyst but she is unable to remember further surgical history     LOS: 10 days    Kristi Erickson, [facility] 12/08/2021, 8:20 AM Please see Amion for pager number during day hours 7:00am-4:30pm

## 2021-12-08 NOTE — TOC Initial Note (Signed)
Transition of Care [facility]) - Initial/Assessment Note    Patient Details  Name: Kristi Erickson MRN: [id number] Date of Birth: July 14, 1940  Transition of [facility]) CM/SW Contact:    Kristi Erickson, [place] Phone Number: 12/08/2021, 11:39 AM  Clinical Narrative:                  CSW received consult for possible SNF placement at time of discharge. Due to patients current orientation CSW spoke with patients brother Kristi Erickson regarding PT recommendation of SNF placement at time of discharge. Patients brother reports patient comes from home alone.Patients brother Kristi Erickson expressed understanding of PT recommendation and is agreeable to SNF placement for patient at time of discharge. CSW informed patients brother that [facility] will follow to fax patient out for SNF placement closer to patient being medically ready.Patients brother Kristi Erickson gave CSW permission to fax out initial referral near the [facility] and [employer] area.CSW discussed insurance authorization process and will provide Medicare SNF ratings list with accepted SNF bed offers when available.  No further questions reported at this time. CSW to continue to follow and assist with discharge planning needs.   Expected Discharge Plan: Skilled Nursing Facility Barriers to Discharge: Continued Medical Work up   Patient Goals and CMS Choice Patient states their goals for this hospitalization and ongoing recovery are:: To get better CMS Medicare.gov Compare Post Acute Care list provided to:: Patient Represenative (must comment) (Patients brother Kristi Erickson) Choice offered to / list presented to : Sibling (Patients brother Kristi Erickson)  Expected Discharge Plan and Services Expected Discharge Plan: [place]   Discharge Planning Services: CM Consult   Living arrangements for the past 2 months: Single Family Home                                      Prior Living Arrangements/Services Living arrangements for the past 2 months: Single Family  Home Lives with:: Self Patient language and need for interpreter reviewed:: Yes Do you feel safe going back to the place where you live?: Yes      Need for Family Participation in Patient Care: Yes (Comment) Care giver support system in place?: Yes (comment) Current home services: DME (RW at the home, patient drives) Criminal Activity/Legal Involvement Pertinent to Current Situation/Hospitalization: No - Comment as needed  Activities of Daily Living Home Assistive Devices/Equipment: Hearing aid, Eyeglasses, Walker (specify type), Cane (specify quad or straight) ADL Screening (condition at time of admission) Patient's cognitive ability adequate to safely complete daily activities?: Yes Is the patient deaf or have difficulty hearing?: Yes Does the patient have difficulty seeing, even when wearing glasses/contacts?: No Does the patient have difficulty concentrating, remembering, or making decisions?: No Patient able to express need for assistance with ADLs?: Yes Does the patient have difficulty dressing or bathing?: No Independently performs ADLs?: Yes (appropriate for developmental age) Does the patient have difficulty walking or climbing stairs?: No Weakness of Legs: None Weakness of Arms/Hands: None  Permission Sought/Granted Permission sought to share information with : Case Manager, Family Supports, Chartered certified accountant granted to share information with : No  Share Information with NAME: Due to patients current orientation CSW spoke with patients brother Kristi Erickson  Permission granted to share info w AGENCY: Due to patients current orientation CSW spoke with patients brother Tom/SNF  Permission granted to share info w Relationship: Due to patients current orientation CSW spoke with patients brother Kristi Erickson  Permission granted to share info w Contact Information: Due to patients current orientation CSW spoke with patients brother Tom/ [phone]  Emotional  Assessment Appearance:: Appears stated age Attitude/Demeanor/Rapport: Engaged Affect (typically observed): Accepting Orientation: :  (Intubated/trach) Alcohol / Substance Use: Not Applicable Psych Involvement: No (comment)  Admission diagnosis:  Pneumoperitoneum [K66.8] Bowel perforation (HCC) [K63.1] Generalized abdominal pain [R10.84] Patient Active Problem List   Diagnosis Date Noted   Shock (HCC) 12-30-21   Acute respiratory failure (HCC) 30-Dec-2021   Perforated sigmoid colon (HCC) Dec 30, 2021   Malnutrition of moderate degree 11/30/2021   Pneumoperitoneum 12/07/2021   Hypokalemia 12/03/2021   Bowel perforation (HCC) 11/10/2021   History of repair of thoracic aortic aneurysm 10/10/2017   Symptomatic bradycardia 10/10/2017   Bifascicular block 10/10/2017   Osteoporosis 12/25/2016   Preoperative cardiovascular examination    Ascending aortic aneurysm (HCC)    Aneurysm of ascending aorta (HCC) 05/19/2015   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

## 2021-12-08 NOTE — Progress Notes (Signed)
Pharmacy Antibiotic Note  Kristi Erickson is a 81 y.o. female admitted on 11-29-21 with intra-abdominal infection, initiated on Zosyn 11/20-11/28. Pharmacy has been consulted for vancomycin/cefepime dosing now for sepsis. Also with Flagyl ordered per MD.  SCr has increased, vancomycin random ~36 hours after loading dose was 12 mcg/ml.   Plan: Continue cefepime to 2g IV q24h Begin vancomycin 500mg  IV q48h tonight Flagyl 500mg  IV q12h Watch Cr, repeat vancomycin level PRN pending renal function   Height: 4\' 11"  (149.9 cm) Weight: 45.7 kg (100 lb 12 oz) IBW/kg (Calculated) : 43.2  Temp (24hrs), Avg:98.2 F (36.8 C), Min:97.9 F (36.6 C), Max:98.3 F (36.8 C)  Recent Labs  Lab 12/03/21 0352 12/04/21 0410 11/18/2021 0257 11/19/2021 1843 11/27/2021 2039 12/07/21 0102 12/07/21 0310 12/07/21 0623 12/07/21 0848 12/07/21 1420 12/07/21 2002 12/08/21 0640 12/08/21 0833  WBC 13.2*  --  21.0* 4.0  --   --   --   --  24.4*  --   --   --  21.0*  CREATININE 0.90   < > 0.73  --    < >  --  0.89 0.86  --  1.28* 1.41* 1.35*  --   LATICACIDVEN  --   --   --  2.5*  --  1.1  --   --  4.2* 3.1*  --   --   --    < > = values in this interval not displayed.     Estimated Creatinine Clearance: 22.7 mL/min (A) (by C-G formula based on SCr of 1.35 mg/dL (H)).    Allergies  Allergen Reactions   Shellfish-Derived Products Nausea And Vomiting    Clam, oyster   Remeron [Mirtazapine] Rash     12/09/21, PharmD, BCPS, [facility] Clinical Pharmacist [phone] Please check AMION for all [facility] Pharmacy numbers 12/08/2021

## 2021-12-08 NOTE — Anesthesia Postprocedure Evaluation (Signed)
Anesthesia Post Note  Patient: Kristi Erickson  Procedure(s) Performed: LAPAROSCOPY DIAGNOSTIC (Abdomen) EXPLORATORY LAPAROTOMY (Abdomen)     Patient location during evaluation: SICU Anesthesia Type: General Level of consciousness: sedated Pain management: pain level controlled Vital Signs Assessment: post-procedure vital signs reviewed and stable Respiratory status: patient remains intubated per anesthesia plan Cardiovascular status: stable Postop Assessment: no apparent nausea or vomiting Anesthetic complications: no   No notable events documented.  Last Vitals:  Vitals:   12/08/21 1700 12/08/21 1800  BP: (!) 97/51 115/73  Pulse: (!) 105 (!) 139  Resp: (!) 23 (!) 24  Temp:    SpO2: 94% 93%    Last Pain:  Vitals:   12/08/21 1615  TempSrc: Axillary  PainSc:                  Nelle Don Wiliam Cauthorn

## 2021-12-08 NOTE — Progress Notes (Signed)
NAME:  Kristi Erickson, MRN:  [id number], DOB:  08/14/1940, LOS: 10 ADMISSION DATE:  11/20/2021, CONSULTATION DATE:  11/28 REFERRING MD:  Dr. Derrell Lolling, CHIEF COMPLAINT:  Perf sigmoid colon; shock   History of Present Illness:  Patient is 81 year old female with pertinent PMH of Turner syndrome, HTN, HLD, hypothyroidism, chronic stomach problems presents to [facility] ED on 11/20 with nausea and abdominal pain.  Patient admitted to [facility] ED on 11/20 with abdominal pain.  CT abdomen/pelvis showing small pneumoperitoneum with concern for bowel perforation.  WBC 37.  Surgery consulted.  Patient thought to have bowel obstruction.  Made n.p.o. given IV fluids.  NG tube on low intermittent suction.  Culture sent and started on antibiotics.  On 11/28 patient with abdominal distention.  Abdominal x-ray showing dilated small bowel but minimal.  Patient taken to the OR for surgery for ex lap.  Found to have perforated sigmoid colon.  Bowel resection performed and placing ostomy.  Patient now on multiple pressors: Vaso and levo.  Patient going to ICU post OR.  PCCM consulted.  Pertinent  Medical History   Past Medical History:  Diagnosis Date   Acute blood loss anemia    Anxiety    Ascending aortic aneurysm (HCC)    Depression    Diverticulitis    GERD (gastroesophageal reflux disease)    Glaucoma    Heart murmur    History of hiatal hernia    Hyperlipidemia    Hypertension    Hypokalemia    Hypothyroidism    IBS (irritable bowel syndrome)    Insomnia    Leukocytosis    Osteoporosis    Physical deconditioning    Stomach problems    Turner's syndrome    Vision disorder    Vitamin D deficiency      Significant Hospital Events: Including procedures, antibiotic start and stop dates in addition to other pertinent events   11/20: admitted to [facility], In abdominal pain; possible bowel obstruction 11/28: increased bowel distention; went to OR; perf sigmoid colon; received bowel resection and ostomy  creation. Probably aspirated during case.  Interim History / Subjective:  Overnight had a short run of SVT.  Afebrile overnight.  No pain this morning.  Objective   Blood pressure 123/60, pulse (!) 114, temperature 98.3 F (36.8 C), temperature source Oral, resp. rate 18, height 4\' 11"  (1.499 m), weight 45.7 kg, SpO2 94 %.    Vent Mode: PRVC FiO2 (%):  [40 %] 40 % Set Rate:  [18 bmp] 18 bmp Vt Set:  [350 mL] 350 mL PEEP:  [5 cmH20] 5 cmH20 Plateau Pressure:  [8 cmH20-15 cmH20] 9 cmH20   Intake/Output Summary (Last 24 hours) at 12/08/2021 0715 Last data filed at 12/08/2021 0700 Gross per 24 hour  Intake 6490.04 ml  Output 610 ml  Net 5880.04 ml    Filed Weights   12/02/21 1648 2021-12-27 2230 12/08/21 0500  Weight: 50.6 kg 41.1 kg 45.7 kg    Examination: General: critically ill appearing woman lying in bed in NAD HEENT: [place]/AT, eyes anicteric Resp : minimal ETT secretions, CTAB. Breathing comfortably on 8/5 CV: S1S2, tachycardic, reg rhythm Neuro : RASS 0, following commands, nodding to answer questions GI: soft, NT. Ostomy pink. Extremities: hand and pedal edema Skin: warm, dry, no diffuse rashes.  Na+ 130 K+ 4.2 Bicarb 31 BUN 50 Cr 1.35 Resp culture> moderate WBC Blood culture NGTD  Resolved Hospital Problem list   Hypocalcemia Lactic acidosis, metabolic acidosis Hypophosphatemia hypokalemia  Assessment & Plan:  Septic shock due to peritonitis Perforated sigmoid colon s/p bowel resection and ostomy placement on 11/28 Small volume pneumoperitoneum Likely left lower lobe aspiration pneumonia versus pneumonitis -con't NPO, NGT to LIWS -TPN -appreciate surgery's management -ostomy care -vanc, cefepime, flagyl- renally adjusted -con't titrating off pressors as able to maintain MAP >65 -follow trach aspirate  Severe metabolic acidosis, resolved -d/c bicarb gtt  Postop mechanical vent management Acute respiratory failure with hypoxia L pleural effusion vs  aspiration pneumonia -LTVV -VAP prevention protocol -PAD protocol -daily SAT & SBT> hopeful for extubation today  Hyponatremia, hypervolemic Hypokalemia then hyperkalemia - resolved AKI -strict I/Os -lasix today -renally dose meds, avoid nephrotoxic meds -maintain adequate renal perfusion  Acute post-op anemia -transfuse for Hb <7 or significant bleeding  Acute urinary retention -trial of foley removal today, urecholine  HTN, HLD -holding statin, antihypertensives until able to take oral meds, off pressors  Hypothyroidism -IV synthroid until able to take PO  Hyperglycemia, controlled -semglee 5 units daily -SSI PRN -goal BG 140-180  Depression -hold PTA mirtazapine  Best Practice (right click and "Reselect all SmartList Selections" daily)   Diet/type: NPO and TPN DVT prophylaxis: prophylactic heparin   GI prophylaxis: PPI Lines: Central line Foley:  Yes, and it is still needed Code Status:  DNR Last date of multidisciplinary goals of care discussion [11/28 updated brother [name] over phone. Said he won't be able to come in person until Thursday.]  Labs   CBC: Recent Labs  Lab 12/01/21 0907 12/02/21 0257 12/03/21 0352 11/21/2021 0257 12/01/2021 1843 11/29/2021 2128 12/07/21 0848  WBC 19.1* 14.5* 13.2* 21.0* 4.0  --  24.4*  NEUTROABS 17.0* 12.6* 11.1* 18.8* 3.3  --   --   HGB 14.6 14.0 13.7 13.2 10.8* 11.9* 13.4  HCT 45.1 40.9 40.2 39.7 33.8* 35.0* 41.8  MCV 94.2 92.3 91.6 93.0 95.2  --  97.2  PLT PLATELET CLUMPS NOTED ON SMEAR, UNABLE TO ESTIMATE 235 223 173 147*  --  138*     Basic Metabolic Panel: Recent Labs  Lab 12/03/21 0352 12/03/21 2018 12/04/21 0410 12/05/21 0500 11/29/2021 0257 11/24/2021 2039 11/10/2021 2128 12/07/21 0310 12/07/21 0623 12/07/21 1420 12/07/21 2002  NA 143  --  143 141 137 137 136 132* 127* 129* 133*  K 3.7  --  3.1* 3.0* 3.4* 2.8* 3.7 3.4* 5.8* 4.4 4.8  CL 107  --  105 106 102 109  --  105 105 100 93*  CO2 28  --  27 29 25  19*   --  18* 14* 20* 23  GLUCOSE 122*  --  136* 152* 149* 173*  --  205* 192* 192* 159*  BUN 26*  --  18 21 30* 33*  --  39* 38* 48* 53*  CREATININE 0.90  --  0.75 0.79 0.73 0.64  --  0.89 0.86 1.28* 1.41*  CALCIUM 8.7*  --  8.1* 8.1* 8.1* 6.2*  --  6.1* 5.6* >15.0* 8.3*  MG 2.2  --  2.0 1.8 2.2  --   --   --  1.4*  --   --   PHOS <1.0*   < > 4.4 2.7 2.7  --   --   --  3.3 3.7  --    < > = values in this interval not displayed.    GFR: Estimated Creatinine Clearance: 21.7 mL/min (A) (by C-G formula based on SCr of 1.41 mg/dL (H)). Recent Labs  Lab 12/03/21 0352 11/22/2021 0257 11/13/2021 1843 12/07/21 0102 12/07/21 0848 12/07/21  1420  WBC 13.2* 21.0* 4.0  --  24.4*  --   LATICACIDVEN  --   --  2.5* 1.1 4.2* 3.1*     This patient is critically ill with multiple organ system failure which requires frequent high complexity decision making, assessment, support, evaluation, and titration of therapies. This was completed through the application of advanced monitoring technologies and extensive interpretation of multiple databases. During this encounter critical care time was devoted to patient care services described in this note for 45 minutes.  Julian Hy, DO 12/08/21 7:59 AM [facility]  For contact information, see Amion. If no response to pager, please call PCCM consult pager. After hours, 7PM- 7AM, please call Elink.

## 2021-12-08 NOTE — Progress Notes (Addendum)
PHARMACY - TOTAL PARENTERAL NUTRITION CONSULT NOTE   Indication:  prolonged NPO status due to nausea/vomiting in the setting of pneumoperitoneum and constipation   Patient Measurements: Height: _0  (149.9 cm) Weight: 45.7 kg (100 lb 12 oz) IBW/kg (Calculated) : 43.2 TPN AdjBW (KG): 41.1 Body mass index is 20.35 kg/m. Usual Weight: 48kg, ~ 105/106 lbs per patient   Assessment: Patient with PMH notable for aortic insufficiency, hypertension, HLD, hypothyroidism, Turner syndrome, horseshoe kidney presented with initial concern for a small bowel obstruction. Patient reports no bowel movement in 10 days. Throughout this time she has had poor oral intake saying she only ate 1-2 crackers per day prior to admission. Prior to this, patient had adequate oral intake stating she ate 3 meals per day. Average meal included protein, carbs, and some fat (for ex: breakfast: 1-2 eggs, sausage, and english muffin). General surgery consulted for pneumoperitoneum seen on CT scan. Initial plan was medical management however patient had decompensation on 11/28 requiring intervention. Patient had perforated sigmoid colon on 11/28 that required ex-lap.   Of note, patient has shellfish allergy reported. Patient confirms allergy in the chart with reported reaction of nausea and vomiting. Patient reports that she has tolerated other fish besides oysters and scallops. No anaphylaxis reported in the past.   Glucose / Insulin: no history of diabetes noted, last A1C from several years ago 5.5%  BS mostly controlled (range 116-192) while on Semglee 5,  10u SSI Electrolytes: phos: 4.3, K: 4.2, Na: 130, Mag: 2.6 (s/p 5G replacement 11/29), CoCa: 9.2 (s/p 4g calcium gluconate, erroneous lab draw showing > 15 on 11/29), HCO: 31, Cl: 88  Renal: AKI, Scr: 1.35 (BL  ~0.8), BUN: 50 Hepatic: AST/ALT WNL, Alk phos: 48 (11/30), TG 93, albumin: <1.5, T-bili: 0.4 Intake / Output; MIVF: UOP: 0.29m/kg/hr, LBM 11/27, lasix 458mIV x1  ordered for this morning  GI Imaging: 11/20: CT abdomen, small pneumoperitoneum  11/26 Abd x-ray - Stable small bowel dilatation is noted concerning for distal small bowel obstruction or possibly ileus  11/27 Abd x-ray -Persistent small bowel dilation concerning for small bowel obstruction. GI Surgeries / Procedures:  11/28: Ex-lap with hartmann, perf sigmoid colon, bowel resection and ostomy placement    Central access: PICC line placed 11/25  TPN start date: 11/25   Nutritional Goals: Goal TPN rate is 65 mL/hr (provides 80 g of protein and 1500 kcals per day, per RD updated recommendations 11/29)  Estimated Needs Total Energy Estimated Needs: 1350-1575 kcals Total Protein Estimated Needs: 65-80 gm Total Fluid Estimated Needs: >/= 1.3 L  Current Nutrition:  TPN  NPO   Plan:  Increase goal rate TPN to goal of 6535mr at 1800 per RD recommendations on 11/29 will meet approximately 100% of nutritional needs providing 1500kcal and 80g AA.  Electrolytes in TPN: Na 100m57m (increased 11/29), K 30mE46m(decreased 11/29 for AKI), Ca 7mEq/41mincreased 11/29 ), Decrease Mg 6mEq/L19mas 10mEq, 67meased on 11/29, had previously been stable on 7mEq/L),9md decrease phos 16mmol/L 48m 22mmol/L, 63mr limit normal, high rate of increase within the past few days with AKI). From Max Acetate to max chloride. Metabolic acidosis has rapidly corrected with bicarb gtt. Bicarb is now off.   Will leave K as AKI is improving based on Scr and K stable with downward trend from 11/29 (peak @ 5.8), also will be getting diuresis today  Will hold off on concentrating TPN today after discussion with CCM, diuresis to help with potential extubation but monitor fluid status  closely.  Add standard MVI and trace elements to TPN Continue SSI to sensitive q4h SSI at 1800, Semglee 5u added by MD.  Monitor TPN labs on Mon/Thurs, daily until stabilization  Esmeralda Arthur, PharmD, BCCCP  Clinical Pharmacist Please see  AMION for all Pharmacists' Contact Phone Numbers 12/08/2021, 7:59 AM

## 2021-12-09 ENCOUNTER — Inpatient Hospital Stay (HOSPITAL_COMMUNITY): Payer: Medicare Other

## 2021-12-09 DIAGNOSIS — R6521 Severe sepsis with septic shock: Secondary | ICD-10-CM | POA: Diagnosis not present

## 2021-12-09 DIAGNOSIS — K659 Peritonitis, unspecified: Secondary | ICD-10-CM | POA: Diagnosis not present

## 2021-12-09 DIAGNOSIS — A419 Sepsis, unspecified organism: Secondary | ICD-10-CM | POA: Diagnosis not present

## 2021-12-09 DIAGNOSIS — K668 Other specified disorders of peritoneum: Secondary | ICD-10-CM | POA: Diagnosis not present

## 2021-12-09 DIAGNOSIS — R609 Edema, unspecified: Secondary | ICD-10-CM

## 2021-12-09 LAB — CBC
HCT: 26.2 % — ABNORMAL LOW (ref 36.0–46.0)
Hemoglobin: 9.3 g/dL — ABNORMAL LOW (ref 12.0–15.0)
MCH: 31.3 pg (ref 26.0–34.0)
MCHC: 35.5 g/dL (ref 30.0–36.0)
MCV: 88.2 fL (ref 80.0–100.0)
Platelets: 113 10*3/uL — ABNORMAL LOW (ref 150–400)
RBC: 2.97 MIL/uL — ABNORMAL LOW (ref 3.87–5.11)
RDW: 13.4 % (ref 11.5–15.5)
WBC: 16.9 10*3/uL — ABNORMAL HIGH (ref 4.0–10.5)
nRBC: 0.2 % (ref 0.0–0.2)

## 2021-12-09 LAB — CULTURE, RESPIRATORY W GRAM STAIN: Culture: NO GROWTH

## 2021-12-09 LAB — COMPREHENSIVE METABOLIC PANEL
ALT: 22 U/L (ref 0–44)
AST: 21 U/L (ref 15–41)
Albumin: <1.5 g/dL — ABNORMAL LOW (ref 3.5–5.0)
Alkaline Phosphatase: 65 U/L (ref 38–126)
Anion gap: 11 (ref 5–15)
BUN: 60 mg/dL — ABNORMAL HIGH (ref 8–23)
CO2: 27 mmol/L (ref 22–32)
Calcium: 7.8 mg/dL — ABNORMAL LOW (ref 8.9–10.3)
Chloride: 95 mmol/L — ABNORMAL LOW (ref 98–111)
Creatinine, Ser: 1.63 mg/dL — ABNORMAL HIGH (ref 0.44–1.00)
GFR, Estimated: 32 mL/min — ABNORMAL LOW (ref >60)
Glucose, Bld: 121 mg/dL — ABNORMAL HIGH (ref 70–99)
Potassium: 3 mmol/L — ABNORMAL LOW (ref 3.5–5.1)
Sodium: 133 mmol/L — ABNORMAL LOW (ref 135–145)
Total Bilirubin: 0.5 mg/dL (ref 0.3–1.2)
Total Protein: 3.8 g/dL — ABNORMAL LOW (ref 6.5–8.1)

## 2021-12-09 LAB — MRSA NEXT GEN BY PCR, NASAL: MRSA by PCR Next Gen: NOT DETECTED

## 2021-12-09 LAB — GLUCOSE, CAPILLARY
Glucose-Capillary: 125 mg/dL — ABNORMAL HIGH (ref 70–99)
Glucose-Capillary: 126 mg/dL — ABNORMAL HIGH (ref 70–99)
Glucose-Capillary: 129 mg/dL — ABNORMAL HIGH (ref 70–99)
Glucose-Capillary: 137 mg/dL — ABNORMAL HIGH (ref 70–99)
Glucose-Capillary: 143 mg/dL — ABNORMAL HIGH (ref 70–99)

## 2021-12-09 LAB — MAGNESIUM: Magnesium: 2.3 mg/dL (ref 1.7–2.4)

## 2021-12-09 MED ORDER — IPRATROPIUM-ALBUTEROL 0.5-2.5 (3) MG/3ML IN SOLN
3.0000 mL | RESPIRATORY_TRACT | Status: DC | PRN
  Administered 2021-12-09: 3 mL via RESPIRATORY_TRACT
  Filled 2021-12-09: qty 3

## 2021-12-09 MED ORDER — FUROSEMIDE 10 MG/ML IJ SOLN
80.0000 mg | Freq: Once | INTRAMUSCULAR | Status: AC
  Administered 2021-12-09: 80 mg via INTRAVENOUS
  Filled 2021-12-09: qty 8

## 2021-12-09 MED ORDER — TRACE MINERALS CU-MN-SE-ZN 300-55-60-3000 MCG/ML IV SOLN
INTRAVENOUS | Status: AC
  Filled 2021-12-09: qty 536

## 2021-12-09 MED ORDER — INSULIN ASPART 100 UNIT/ML IJ SOLN
0.0000 [IU] | Freq: Three times a day (TID) | INTRAMUSCULAR | Status: DC
  Administered 2021-12-09 (×2): 1 [IU] via SUBCUTANEOUS

## 2021-12-09 MED ORDER — TRAVASOL 10 % IV SOLN: INTRAVENOUS | Status: DC

## 2021-12-09 MED ORDER — SODIUM CHLORIDE 0.9 % IV SOLN: INTRAVENOUS | Status: DC | PRN

## 2021-12-09 MED ORDER — POTASSIUM CHLORIDE 10 MEQ/50ML IV SOLN
10.0000 meq | INTRAVENOUS | Status: AC
  Administered 2021-12-09 (×4): 10 meq via INTRAVENOUS
  Filled 2021-12-09 (×2): qty 50

## 2021-12-09 MED ORDER — POTASSIUM CHLORIDE 10 MEQ/50ML IV SOLN
10.0000 meq | INTRAVENOUS | Status: AC
  Administered 2021-12-09 (×4): 10 meq via INTRAVENOUS
  Filled 2021-12-09: qty 50

## 2021-12-09 NOTE — Progress Notes (Signed)
PHARMACY - TOTAL PARENTERAL NUTRITION CONSULT NOTE   Indication: small volume pneumoperitoneum, SBO, perforated sigmoid colon   Patient Measurements: Height: _0  (149.9 cm) Weight: 45.5 kg (100 lb 5 oz) IBW/kg (Calculated) : 43.2 TPN AdjBW (KG): 41.1 Body mass index is 20.26 kg/m. Usual Weight: 48kg, ~ 105/106 lbs per patient   Assessment: Patient with PMH notable for aortic insufficiency, hypertension, HLD, hypothyroidism, Turner syndrome, horseshoe kidney presented with initial concern for a small bowel obstruction. Patient reports no bowel movement and poor oral intake (ate 1-2 crackers per day) for 10 days prior to admission. Prior to this, patient had adequate oral intake stating she ate 3 meals per day. Average meal included protein, carbs, and some fat (for ex: breakfast: 1-2 eggs, sausage, and english muffin). General surgery consulted for pneumoperitoneum seen on CT scan 11/20. On 11/28 patient decompensated, found to have perforated sigmoid colon now s/p ex-lap. Pharmacy consulted for TPN.   Of note, patient has shellfish allergy reported. Patient confirms allergy in the chart with reported reaction of nausea and vomiting. Patient reports that she has tolerated other fish besides oysters and scallops. No anaphylaxis reported in the past.   Glucose / Insulin: BG < 130, no history of diabetes, last A1C from several years ago 5.5%, Semglee 5,  used 2 u SSI/24hr  Electrolytes: Na 133, K 3, Cl 95, CoCa > 9.8,  others wnl  Renal: Scr: 1.63 (BL  ~0.8), BUN: 60, IV furosemide x2  Hepatic: AST/ALT/alk phos/Tbili WNL, TG 67, albumin: <1.5 Intake / Output; MIVF: UOP: 2.1 mL/kg/hr, ostomy- scant serosanguinous fluid in bag, NGT 255 ml with bilious output GI Imaging: 11/20: CT abd- small pneumoperitoneum, possibility of stercoral colitis is not excluded 11/20 KUB- large stool burden  11/24 KUB- Increased small bowel distension question small bowel obstruction 11/26 KUB - Stable small bowel  dilatation concerning for distal small bowel obstruction or possibly ileus  11/27 KUB -Persistent small bowel dilation concerning for small bowel obstruction 11/28 KUB: persistent gaseous distention  GI Surgeries / Procedures:  11/28: Ex-lap with hartmann, perf sigmoid colon, bowel resection and ostomy placement  Central access: PICC line placed 11/25  TPN start date: 11/25   Nutritional Goals: Goal (unconcentrated) TPN rate is 65 mL/hr (provides 80g of protein and 1500 kcals per day, per RD updated recommendations 11/29)  Estimated Needs Total Energy Estimated Needs: 1350-1575 kcals Total Protein Estimated Needs: 65-80 gm Total Fluid Estimated Needs: >/= 1.3 L  Current Nutrition:  TPN  NPO   Plan:  Concentrate TPN to goal 50 mL/hr at 1800,  provides 80g AA and 1500 kcal meeting 100% of nutritional needs Electrolytes in TPN: Increase Na 150 mEq/L, K 50 mEq/L; Decrease Ca 5 mEq/L, phos 16 mmol/L; Continue Mg 6 mEq/L, max chloride. Give Kcl IV 40 mEq  Add standard MVI and trace elements to TPN Decrease to sensitive SSI q8h, Semglee 5u per team  Monitor TPN labs on 12/2 then Mon/Thurs  Benetta Spar, PharmD, BCPS, [place] Pharmacist  Please check AMION for all [place] phone numbers After 10:00 PM, call [place] [phone]

## 2021-12-09 NOTE — Plan of Care (Addendum)
Patient remains in CVICU. Weaning vasopressor support as tolerated.  NGT to LIWS following failed attempt to clamp earlier today. Ostomy to LLQ. Midline abdominal incision with moderate serous drainage. Levophed and TPN via PICC. Patient requires 4L / min of supplemental O2 via [place]. High output of drainage from abdominal wound overnight requiring three separate dressing and linen changes.  Problem: Education: Goal: Knowledge of General Education information will improve Description: Including pain rating scale, medication(s)/side effects and non-pharmacologic comfort measures Outcome: Progressing   Problem: Health Behavior/Discharge Planning: Goal: Ability to manage health-related needs will improve Outcome: Progressing   Problem: Clinical Measurements: Goal: Ability to maintain clinical measurements within normal limits will improve Outcome: Progressing Goal: Will remain free from infection Outcome: Progressing Goal: Diagnostic test results will improve Outcome: Progressing Goal: Respiratory complications will improve Outcome: Progressing Goal: Cardiovascular complication will be avoided Outcome: Progressing   Problem: Activity: Goal: Risk for activity intolerance will decrease Outcome: Progressing   Problem: Nutrition: Goal: Adequate nutrition will be maintained Outcome: Progressing   Problem: Coping: Goal: Level of anxiety will decrease Outcome: Progressing   Problem: Elimination: Goal: Will not experience complications related to bowel motility Outcome: Progressing Goal: Will not experience complications related to urinary retention Outcome: Progressing   Problem: Pain Managment: Goal: General experience of comfort will improve Outcome: Progressing   Problem: Safety: Goal: Ability to remain free from injury will improve Outcome: Progressing   Problem: Skin Integrity: Goal: Risk for impaired skin integrity will decrease Outcome: Progressing   Problem:  Education: Goal: Ability to describe self-care measures that may prevent or decrease complications (Diabetes Survival Skills Education) will improve Outcome: Progressing Goal: Individualized Educational Video(s) Outcome: Progressing   Problem: Coping: Goal: Ability to adjust to condition or change in health will improve Outcome: Progressing   Problem: Fluid Volume: Goal: Ability to maintain a balanced intake and output will improve Outcome: Progressing   Problem: Health Behavior/Discharge Planning: Goal: Ability to identify and utilize available resources and services will improve Outcome: Progressing Goal: Ability to manage health-related needs will improve Outcome: Progressing   Problem: Metabolic: Goal: Ability to maintain appropriate glucose levels will improve Outcome: Progressing   Problem: Nutritional: Goal: Maintenance of adequate nutrition will improve Outcome: Progressing Goal: Progress toward achieving an optimal weight will improve Outcome: Progressing   Problem: Skin Integrity: Goal: Risk for impaired skin integrity will decrease Outcome: Progressing   Problem: Tissue Perfusion: Goal: Adequacy of tissue perfusion will improve Outcome: Progressing   Problem: Activity: Goal: Ability to tolerate increased activity will improve Outcome: Progressing   Problem: Respiratory: Goal: Ability to maintain a clear airway and adequate ventilation will improve Outcome: Progressing   Problem: Role Relationship: Goal: Method of communication will improve Outcome: Progressing

## 2021-12-09 NOTE — Progress Notes (Signed)
eLink Physician-Brief Progress Note Patient Name: Kristi Erickson DOB: May 26, 1940 MRN: [id number]   Date of Service  12/09/2021  HPI/Events of Note  Notified of patient having respiratory distress Significant secretions after chest percussion. RT unable to NT suction. Extubated yesterday. Sats 80s on 10 L [place] Patient is DNR/DNI  eICU Interventions  Ordered breathing treatments Place on 100% NRBM Prepare nasal airway Informed bedside CCM team who are now at bedside     Intervention Category Intermediate Interventions: Respiratory distress - evaluation and management  [name] 12/09/2021, 10:28 PM

## 2021-12-09 NOTE — Progress Notes (Signed)
NAME:  LYNNELL FIUMARA, MRN:  [id number], DOB:  May 05, 1940, LOS: 11 ADMISSION DATE:  12/08/2021, CONSULTATION DATE:  11/28 REFERRING MD:  Dr. Derrell Lolling, CHIEF COMPLAINT:  Perf sigmoid colon; shock   History of Present Illness:  Patient is 81 year old female with pertinent PMH of Turner syndrome, HTN, HLD, hypothyroidism, chronic stomach problems presents to [facility] ED on 11/20 with nausea and abdominal pain.  Patient admitted to [facility] ED on 11/20 with abdominal pain.  CT abdomen/pelvis showing small pneumoperitoneum with concern for bowel perforation.  WBC 37.  Surgery consulted.  Patient thought to have bowel obstruction.  Made n.p.o. given IV fluids.  NG tube on low intermittent suction.  Culture sent and started on antibiotics.  On 11/28 patient with abdominal distention.  Abdominal x-ray showing dilated small bowel but minimal.  Patient taken to the OR for surgery for ex lap.  Found to have perforated sigmoid colon.  Bowel resection performed and placing ostomy.  Patient now on multiple pressors: Vaso and levo.  Patient going to ICU post OR.  PCCM consulted.  Pertinent  Medical History   Past Medical History:  Diagnosis Date   Acute blood loss anemia    Anxiety    Ascending aortic aneurysm (HCC)    Depression    Diverticulitis    GERD (gastroesophageal reflux disease)    Glaucoma    Heart murmur    History of hiatal hernia    Hyperlipidemia    Hypertension    Hypokalemia    Hypothyroidism    IBS (irritable bowel syndrome)    Insomnia    Leukocytosis    Osteoporosis    Physical deconditioning    Stomach problems    Turner's syndrome    Vision disorder    Vitamin D deficiency      Significant Hospital Events: Including procedures, antibiotic start and stop dates in addition to other pertinent events   11/20: admitted to [facility] abdominal pain; possible bowel obstruction 11/28: increased bowel distention; went to OR; perf sigmoid colon; received bowel resection and ostomy  creation. Probably aspirated during case.  Interim History / Subjective:    Objective   Blood pressure 121/60, pulse 94, temperature 98.2 F (36.8 C), temperature source Axillary, resp. rate 17, height 4\' 11"  (1.499 m), weight 45.5 kg, SpO2 98 %.    Vent Mode: PSV;CPAP FiO2 (%):  [40 %] 40 % PEEP:  [5 cmH20] 5 cmH20 Pressure Support:  [8 cmH20] 8 cmH20   Intake/Output Summary (Last 24 hours) at 12/09/2021 0704 Last data filed at 12/09/2021 0600 Gross per 24 hour  Intake 2264.76 ml  Output 2505 ml  Net -240.24 ml    Filed Weights   11/27/2021 2230 12/08/21 0500 12/09/21 0618  Weight: 41.1 kg 45.7 kg 45.5 kg   NG output 255 cc   Examination: General: Very frail appearing elderly woman lying in bed no acute distress HEENT: [place]/AT, eyes anicteric Resp : Rhonchi bilaterally, weak ineffective cough, very wet sounding cough.  Dark brown secretions suctioned out of her airway with NTS CV: S1-S2, tachycardic, regular rhythm Neuro : Awake, alert more alert today.  Hard of hearing.  Globally weak.   GI: Soft, minimal tenderness.  Hypoactive bowel sounds.  Ostomy pink with stool output Extremities: Peripheral edema Skin: Warm, dry, no diffuse rashes.  Pallor.  Na+ 133 K+ 3.0 Bicarb 27 BUN 60 Cr 1.63 C16.9 H/H 9.3/26.2 Platelets 113  Resp culture> moderate WBC> no growth to date Blood culture no growth to date  Resolved Hospital Problem list   Hypocalcemia Lactic acidosis, metabolic acidosis Hypophosphatemia Hypokalemia Severe metabolic acidosis   Assessment & Plan:  Septic shock due to peritonitis Perforated sigmoid colon s/p bowel resection and ostomy placement on 11/28 Small volume pneumoperitoneum Likely left lower lobe aspiration pneumonia versus pneumonitis -Clamping trial of NG tube per surgery.  Continue n.p.o. - TPN - Appreciate surgery's management - Ostomy care per protocol - Stopping vancomycin, continue cefepime and Flagyl - Vasopressors as required to  maintain MAP greater than 65 - Continue to follow cultures until finalized.  Postop mechanical vent management Acute respiratory failure with hypoxia L pleural effusion vs aspiration pneumonia -Still needing NTS throughout the day - Adding chest PT - Discussed with RT, remains high risk for intubation -Pulmonary hygiene - Continue supplemental oxygen as required to maintain SpO2 greater than 90% - Lasix  Hyponatremia, hypervolemic-improving Hypokalemia AKI -Strict I's/O - Lasix - Renally dose meds and avoid nephrotoxic meds - Maintain adequate renal perfusion  Acute post-op anemia -Transfuse for hemoglobin less than 7 or hemodynamically significant bleeding  Acute urinary retention -remove foley, urecholine -bladder scan per protocol  HTN, HLD -con't holding statins and hypertensives until able to take PO, off pressors  Hypothyroidism -IV synthroid  Hyperglycemia, controlled -Semglee 5 units daily - Sliding scale insulin as needed-overall minimal requirements - Goal blood glucose 140-180  Depression -Hold PTA mirtazapine until able to take p.o.  Extremity edema - Lasix - Left upper extremity Doppler pending  Best Practice (right click and "Reselect all SmartList Selections" daily)   Diet/type: NPO and TPN DVT prophylaxis: prophylactic heparin   GI prophylaxis: PPI Lines: Central line Foley:  Yes, and it is still needed Code Status:  DNR Last date of multidisciplinary goals of care discussion [11/28 updated brother [name] over phone. Said he won't be able to come in person until Thursday.]  Labs   CBC: Recent Labs  Lab 12/03/21 0352 11/27/2021 0257 11/20/2021 1843 11/24/2021 2128 12/07/21 0848 12/08/21 0833  WBC 13.2* 21.0* 4.0  --  24.4* 21.0*  NEUTROABS 11.1* 18.8* 3.3  --   --   --   HGB 13.7 13.2 10.8* 11.9* 13.4 10.3*  HCT 40.2 39.7 33.8* 35.0* 41.8 30.0*  MCV 91.6 93.0 95.2  --  97.2 90.1  PLT 223 173 147*  --  138* 95*     Basic Metabolic  Panel: Recent Labs  Lab 12/04/21 0410 12/05/21 0500 11/25/2021 0257 11/13/2021 2039 12/07/21 0310 12/07/21 0623 12/07/21 1420 12/07/21 2002 12/08/21 0640  NA 143 141 137   < > 132* 127* 129* 133* 130*  K 3.1* 3.0* 3.4*   < > 3.4* 5.8* 4.4 4.8 4.2  CL 105 106 102   < > 105 105 100 93* 88*  CO2 27 29 25    < > 18* 14* 20* 23 31  GLUCOSE 136* 152* 149*   < > 205* 192* 192* 159* 116*  BUN 18 21 30*   < > 39* 38* 48* 53* 50*  CREATININE 0.75 0.79 0.73   < > 0.89 0.86 1.28* 1.41* 1.35*  CALCIUM 8.1* 8.1* 8.1*   < > 6.1* 5.6* >15.0* 8.3* 7.2*  MG 2.0 1.8 2.2  --   --  1.4*  --   --  2.6*  PHOS 4.4 2.7 2.7  --   --  3.3 3.7  --  4.3   < > = values in this interval not displayed.    GFR: Estimated Creatinine Clearance: 22.7 mL/min (A) (by  C-G formula based on SCr of 1.35 mg/dL (H)). Recent Labs  Lab 11/26/2021 0257 11/28/2021 1843 12/07/21 0102 12/07/21 0848 12/07/21 1420 12/08/21 0833  WBC 21.0* 4.0  --  24.4*  --  21.0*  LATICACIDVEN  --  2.5* 1.1 4.2* 3.1*  --      This patient is critically ill with multiple organ system failure which requires frequent high complexity decision making, assessment, support, evaluation, and titration of therapies. This was completed through the application of advanced monitoring technologies and extensive interpretation of multiple databases. During this encounter critical care time was devoted to patient care services described in this note for 39 minutes.  Julian Hy, DO 12/09/21 9:18 AM [facility]  For contact information, see Amion. If no response to pager, please call PCCM consult pager. After hours, 7PM- 7AM, please call [place].

## 2021-12-09 NOTE — Progress Notes (Signed)
3 Days Post-Op   Subjective/Chief Complaint: Doing well this AM States she's tired    Objective: Vital signs in last 24 hours: Temp:  [97.9 F (36.6 C)-98.3 F (36.8 C)] 98.2 F (36.8 C) (11/30 2300) Pulse Rate:  [94-139] 94 (12/01 0700) Resp:  [13-29] 17 (12/01 0700) BP: (83-152)/(47-117) 121/60 (12/01 0700) SpO2:  [85 %-98 %] 98 % (12/01 0700) Weight:  [45.5 kg] 45.5 kg (12/01 0618) Last BM Date : 12/08/21 (small amounts of liquid stool present in colostomy bag)  Intake/Output from previous day: 11/30 0701 - 12/01 0700 In: 2264.8 [I.V.:1824.8; NG/GT:40; IV Piggyback:400] Out: 2505 [Urine:2250; Emesis/NG output:255] Intake/Output this shift: No intake/output data recorded.  PE:  Constitutional: No acute distress, conversant, appears states age. Eyes: Anicteric sclerae, moist conjunctiva, no lid lag Lungs: Clear to auscultation bilaterally, normal respiratory effort CV: regular rate and rhythm, no murmurs, no peripheral edema, pedal pulses 2+ GI: Soft, no masses or hepatosplenomegaly, non-tender to palpation, inc c/d/I, ostomy patent  Skin: No rashes, palpation reveals normal turgor Psychiatric: appropriate judgment and insight, oriented to person, place, and time   Lab Results:  Recent Labs    12/07/21 0848 12/08/21 0833  WBC 24.4* 21.0*  HGB 13.4 10.3*  HCT 41.8 30.0*  PLT 138* 95*   BMET Recent Labs    12/07/21 2002 12/08/21 0640  NA 133* 130*  K 4.8 4.2  CL 93* 88*  CO2 23 31  GLUCOSE 159* 116*  BUN 53* 50*  CREATININE 1.41* 1.35*  CALCIUM 8.3* 7.2*   PT/INR No results for input(s): "LABPROT", "INR" in the last 72 hours. ABG Recent Labs    12-26-21 2128  PHART 7.343*  HCO3 21.3    Studies/Results: No results found.  Anti-infectives: Anti-infectives (From admission, onward)    Start     Dose/Rate Route Frequency Ordered Stop   12/08/21 2000  vancomycin (VANCOREADY) IVPB 500 mg/100 mL        500 mg 100 mL/hr over 60 Minutes  Intravenous Every 48 hours 12/08/21 1301     12/08/21 1400  ceFEPIme (MAXIPIME) 2 g in sodium chloride 0.9 % 100 mL IVPB        2 g 200 mL/hr over 30 Minutes Intravenous Every 24 hours 12/07/21 2139     12/07/21 2139  vancomycin variable dose per unstable renal function (pharmacist dosing)  Status:  Discontinued         Does not apply See admin instructions 12/07/21 2139 12/08/21 1301   26-Dec-2021 1900  metroNIDAZOLE (FLAGYL) IVPB 500 mg        500 mg 100 mL/hr over 60 Minutes Intravenous Every 12 hours 12-26-21 1730     12-26-21 1815  ceFEPIme (MAXIPIME) 2 g in sodium chloride 0.9 % 100 mL IVPB  Status:  Discontinued        2 g 200 mL/hr over 30 Minutes Intravenous Every 12 hours 12/26/2021 1811 12/07/21 2139   2021-12-26 1815  vancomycin (VANCOREADY) IVPB 1250 mg/250 mL  Status:  Discontinued        1,250 mg 166.7 mL/hr over 90 Minutes Intravenous Every 48 hours 26-Dec-2021 1811 12/07/21 2139   12/02/21 2200  piperacillin-tazobactam (ZOSYN) IVPB 3.375 g  Status:  Discontinued        3.375 g 12.5 mL/hr over 240 Minutes Intravenous Every 8 hours 12/02/21 1340 12/26/2021 1730   11/29/21 0600  piperacillin-tazobactam (ZOSYN) IVPB 2.25 g  Status:  Discontinued        2.25 g 100 mL/hr over 30  Minutes Intravenous Every 8 hours 11/11/2021 2342 12/02/21 1340   11/10/2021 2345  piperacillin-tazobactam (ZOSYN) IVPB 3.375 g  Status:  Discontinued        3.375 g 12.5 mL/hr over 240 Minutes Intravenous  Once 11/29/2021 2330 11/29/21 0003   12/04/2021 2115  piperacillin-tazobactam (ZOSYN) IVPB 3.375 g        3.375 g 100 mL/hr over 30 Minutes Intravenous  Once 11/14/2021 2113 11/18/2021 2345       Assessment/Plan: POD 3, s/p ex lap with Hartmann's for perforated sigmoid colon with contained fluid collection and SBO, Dr. Derrell Lolling 11/28 -min NGT output.  Clamp NGTand if tol later today ok to DC and start fulls -zosyn DC 11/28 and transitioned to broad-spectrum per CCM to cefepime, flagyl, vanc.  Suspect vanc could be  stopped relatively quickly if not needed per CCM and no other source for this. -wound clean but with some serous drainage, BID dry dressings today to midline wound -WOC following for new colostomy -cont TNA for nutritional support   FEN: NPO/NGT clamping today/fluids per primary/TNA ID: [email or web address], Dc 11/28, Cefepime, Flagyl/vanc 11/28 --> VTE: SCDs, Heparin subq   Per primary: Acute hypoxic respiratory failure post op - possible extubation today AKI - improving Anxiety/depression GERD HLD HTN Hypothyroidism IBS Turner syndrome hard of hearing  History of ascending and arch aneurysm with graft 2017 H/o prior abdominal surgery, at least surgery for ovarian cyst but she is unable to remember further surgical history   LOS: 11 days    Axel Filler 12/09/2021

## 2021-12-09 NOTE — Evaluation (Signed)
Occupational Therapy Re-Evaluation Patient Details Name: Kristi Erickson MRN: [id number] DOB: Jun 21, 1940 Today's Date: 12/09/2021   History of Present Illness 80 yo female admitted 11/30/2021 with pneumoperitoneum. Perf sigmoid colon and received bowel resection and colostomy 11/28. ETT 11/28-11/30. PMhx; AKI, HLD, HTN, IBS, GERD, HOH, Turner syndrome.   Clinical Impression   Pt with significant decrease in abilities post-op. Limited to bed level re-evaluation today due to  abdominal wound. Pt requiring mod A for bed level grooming, pt did participate in bed level exercises for both UE and LE. Total to max A for LB ADL, at least mod A for UB ADL. Pt goals updated. OT will continue to follow acutely. Pt will require SNF post-acute.      Recommendations for follow up therapy are one component of a multi-disciplinary discharge planning process, led by the attending physician.  Recommendations may be updated based on patient status, additional functional criteria and insurance authorization.   Follow Up Recommendations  Skilled nursing-short term rehab (<3 hours/day)     Assistance Recommended at Discharge Frequent or constant Supervision/Assistance  Patient can return home with the following Assistance with cooking/housework;Assist for transportation;A lot of help with walking and/or transfers;A lot of help with bathing/dressing/bathroom;Help with stairs or ramp for entrance    Functional Status Assessment  Patient has had a recent decline in their functional status and demonstrates the ability to make significant improvements in function in a reasonable and predictable amount of time.  Equipment Recommendations  BSC/3in1    Recommendations for Other Services       Precautions / Restrictions Precautions Precautions: Fall Precaution Comments: NG tube; colostomy; open abdominal incision, wound vac Restrictions Weight Bearing Restrictions: No      Mobility Bed Mobility Overal bed  mobility: Needs Assistance             General bed mobility comments: total A +2 with bed pad for repositioning - deferred further due to abdomen    Transfers                   General transfer comment: deferred bed mobility as RN requested to hold on OOB mobility due to open abdominal incision      Balance                                           ADL either performed or assessed with clinical judgement   ADL Overall ADL's : Needs assistance/impaired Eating/Feeding: Modified independent;NPO   Grooming: Moderate assistance;Wash/dry face;Bed level (HOB elevated)   Upper Body Bathing: Moderate assistance;Bed level   Lower Body Bathing: Maximal assistance;Bed level   Upper Body Dressing : Maximal assistance   Lower Body Dressing: Maximal assistance;Bed level   Toilet Transfer:  (bed level currently) Toilet Transfer Details (indicate cue type and reason): catheter and colostomy Toileting- Clothing Manipulation and Hygiene: Bed level;Maximal assistance       Functional mobility during ADLs:  (bed level) General ADL Comments: deconditioned, enjoyed christmas music during session     Vision Baseline Vision/History: 1 Wears glasses Ability to See in Adequate Light: 0 Adequate Patient Visual Report: No change from baseline Vision Assessment?: No apparent visual deficits     Perception     Praxis      Pertinent Vitals/Pain Pain Assessment Faces Pain Scale: Hurts little more Pain Location: abdomen, extremities with excercise Pain Descriptors / Indicators: Grimacing Pain  Intervention(s): Monitored during session, Limited activity within patient's tolerance     Hand Dominance Right   Extremity/Trunk Assessment Upper Extremity Assessment Upper Extremity Assessment: Generalized weakness (edema in BUE - exercises, elevation to address)   Lower Extremity Assessment Lower Extremity Assessment: Defer to PT evaluation   Cervical / Trunk  Assessment Cervical / Trunk Assessment: Kyphotic;Other exceptions Cervical / Trunk Exceptions: open abdominal incision   Communication Communication Communication: HOH (hears better in L ear with hearing aid in)   Cognition Arousal/Alertness: Awake/alert Behavior During Therapy: Flat affect Overall Cognitive Status: No family/caregiver present to determine baseline cognitive functioning Area of Impairment: Safety/judgement, Awareness, Following commands, Memory, Attention, Problem solving                   Current Attention Level: Sustained Memory: Decreased short-term memory Following Commands: Follows one step commands consistently, Follows one step commands with increased time, Follows multi-step commands inconsistently Safety/Judgement: Decreased awareness of deficits, Decreased awareness of safety Awareness: Intellectual Problem Solving: Slow processing, Requires verbal cues, Requires tactile cues General Comments: Pt needing extra time to process and initiate movements (HOH?) Pt needing repeated step-by-step cues to follow through fully with each exercise.     General Comments       Exercises Exercises: Other exercises, General Lower Extremity, General Upper Extremity General Exercises - Upper Extremity Shoulder Flexion: AAROM, Both, 10 reps, Supine Shoulder ABduction: AAROM, Both, 10 reps, Supine Elbow Flexion: AROM, Both, 10 reps, Supine Elbow Extension: AROM, Both, 10 reps, Supine Digit Composite Flexion: AROM, Both, 10 reps, Supine General Exercises - Lower Extremity Ankle Circles/Pumps: AROM, Strengthening, Both, 10 reps, Supine Hip ABduction/ADduction: Both, 10 reps, Supine, AAROM Other Exercises Other Exercises: Heel slides bil AROM x10 Other Exercises: Bil UE forward press x5 AROM   Shoulder Instructions      Home Living Family/patient expects to be discharged to:: Private residence Living Arrangements: Alone Available Help at Discharge:  Family;Available PRN/intermittently Type of Home: House Home Access: Stairs to enter [employer] of Steps: 1   Home Layout: One level     Bathroom Shower/Tub: Producer, television/film/video: Standard     Home Equipment: Agricultural consultant (2 wheels)          Prior Functioning/Environment Prior Level of Function : Independent/Modified Independent;Driving                        OT Problem List: Decreased strength;Decreased activity tolerance;Impaired balance (sitting and/or standing);Decreased safety awareness;Decreased knowledge of use of DME or AE;Increased edema      OT Treatment/Interventions: Self-care/ADL training;Therapeutic exercise;DME and/or AE instruction;Therapeutic activities;Cognitive remediation/compensation;Patient/family education;Balance training    OT Goals(Current goals can be found in the care plan section) Acute Rehab OT Goals Patient Stated Goal: get better OT Goal Formulation: With patient Time For Goal Achievement: 12/15/21 Potential to Achieve Goals: Good ADL Goals Pt Will Perform Grooming: with min assist;sitting Pt Will Perform Lower Body Dressing: with max assist;sitting/lateral leans Pt Will Transfer to Toilet: with max assist;with +2 assist;stand pivot transfer;bedside commode Additional ADL Goal #1: Pt will perform bed mobility at max A prior to engaging in ADL  OT Frequency: Min 2X/week    Co-evaluation              AM-PAC OT "6 Clicks" Daily Activity     Outcome Measure Help from another person eating meals?: Total (NPO) Help from another person taking care of personal grooming?: A Lot Help from another person toileting,  which includes using toliet, bedpan, or urinal?: Total Help from another person bathing (including washing, rinsing, drying)?: A Lot Help from another person to put on and taking off regular upper body clothing?: A Lot Help from another person to put on and taking off regular lower body clothing?:  Total 6 Click Score: 9   End of Session Equipment Utilized During Treatment: Oxygen Nurse Communication: Mobility status  Activity Tolerance: Patient limited by fatigue Patient left: in bed;with call bell/phone within reach;with bed alarm set  OT Visit Diagnosis: Unsteadiness on feet (R26.81);Muscle weakness (generalized) (M62.81)                Time: 0623-7628 OT Time Calculation (min): 22 min Charges:  OT General Charges $OT Visit: 1 Visit OT Evaluation $OT Re-eval: 1 Re-eval  Nyoka Cowden OTR/L Acute Rehabilitation Services Office: [phone]  Evern [facility] 12/09/2021, 12:31 PM

## 2021-12-09 NOTE — Progress Notes (Signed)
PCCM called to bedside for patient having respiratory distress and hypoxia. Patient RR in 30s with accessory muscle use. Sats upper 80s to low 90s on NRB. BS rhonchi b/l. NT suctioning lots of thick tan secretions.  O:  Today's Vitals   12/09/21 2245 12/09/21 2250 12/09/21 2255 12/09/21 2300  BP:    (!) 134/90  Pulse: (!) 114 (!) 112 (!) 113 (!) 115  Resp: (!) 30 (!) 33 (!) 36 (!) 29  Temp:      TempSrc:      SpO2: 92% 91% (!) 85% (!) 89%  Weight:      Height:      PainSc:       Body mass index is 21.73 kg/m.  General:   critically ill appearing on NRB HEENT: MM pink/moist; NRB and ng tube in place Neuro: hard of hearing; follows intermittent commands CV: s1s2, tachy 110s, no m/r/g PULM:  coarse rhonchi BS bilaterally; NRB GI: soft, hypactive bs; ostomy in place Extremities: warm/dry, peripheral edema  Skin: no rashes or lesions   AP: Acute respiratory failure with hypoxia P: -place on HHFNC and wean fio2 for sats >92% -place nasal trumpet; nt suction prn -give 80 lasix -pulm toiletry -check CXR -continue NG tube to LIS  GOC P: -Attempted to call brother Elijah Birk over phone but no answer to discuss GOC. Patient is currently DNR/DNI. Will respect patients wishes and continue with supportive care.  JD Anselm [facility] 12/09/2021, 11:12 PM  Please see [email or web address] for pager details.  From 7A-7P if no response, please call [phone]. After hours, please call ELink [phone].

## 2021-12-09 NOTE — Progress Notes (Signed)
Left upper extremity venous duplex has been completed. Preliminary results can be found in CV Proc through chart review.  Results were given to the patient's nurse, Harrold Donath.  12/09/21 2:15 PM Olen Cordial RVT

## 2021-12-09 DEATH — deceased

## 2021-12-10 DIAGNOSIS — J9601 Acute respiratory failure with hypoxia: Secondary | ICD-10-CM | POA: Diagnosis not present

## 2021-12-10 DIAGNOSIS — K668 Other specified disorders of peritoneum: Secondary | ICD-10-CM | POA: Diagnosis not present

## 2021-12-10 DIAGNOSIS — Z515 Encounter for palliative care: Secondary | ICD-10-CM

## 2021-12-10 DIAGNOSIS — N170 Acute kidney failure with tubular necrosis: Secondary | ICD-10-CM

## 2021-12-10 DIAGNOSIS — A419 Sepsis, unspecified organism: Secondary | ICD-10-CM | POA: Diagnosis not present

## 2021-12-10 LAB — BASIC METABOLIC PANEL
Anion gap: 14 (ref 5–15)
BUN: 65 mg/dL — ABNORMAL HIGH (ref 8–23)
CO2: 25 mmol/L (ref 22–32)
Calcium: 7.9 mg/dL — ABNORMAL LOW (ref 8.9–10.3)
Chloride: 99 mmol/L (ref 98–111)
Creatinine, Ser: 1.55 mg/dL — ABNORMAL HIGH (ref 0.44–1.00)
GFR, Estimated: 34 mL/min — ABNORMAL LOW (ref >60)
Glucose, Bld: 156 mg/dL — ABNORMAL HIGH (ref 70–99)
Potassium: 3.3 mmol/L — ABNORMAL LOW (ref 3.5–5.1)
Sodium: 138 mmol/L (ref 135–145)

## 2021-12-10 LAB — MAGNESIUM: Magnesium: 2.1 mg/dL (ref 1.7–2.4)

## 2021-12-10 LAB — GLUCOSE, CAPILLARY: Glucose-Capillary: 165 mg/dL — ABNORMAL HIGH (ref 70–99)

## 2021-12-10 LAB — PHOSPHORUS: Phosphorus: 3.8 mg/dL (ref 2.5–4.6)

## 2021-12-10 MED ORDER — PROCHLORPERAZINE MALEATE 10 MG PO TABS: 10.0000 mg | ORAL_TABLET | Freq: Four times a day (QID) | ORAL | Status: DC | PRN

## 2021-12-10 MED ORDER — MORPHINE 100MG IN NS 100ML (1MG/ML) PREMIX INFUSION
0.0000 mg/h | INTRAVENOUS | Status: DC
  Administered 2021-12-10: 5 mg/h via INTRAVENOUS
  Administered 2021-12-10: 8 mg/h via INTRAVENOUS
  Filled 2021-12-10 (×2): qty 100

## 2021-12-10 MED ORDER — SODIUM CHLORIDE 0.9 % IV SOLN: INTRAVENOUS | Status: DC

## 2021-12-10 MED ORDER — GLYCOPYRROLATE 1 MG PO TABS: 1.0000 mg | ORAL_TABLET | ORAL | Status: DC | PRN

## 2021-12-10 MED ORDER — GLYCOPYRROLATE 0.2 MG/ML IJ SOLN: 0.2000 mg | INTRAMUSCULAR | Status: DC | PRN

## 2021-12-10 MED ORDER — POTASSIUM CHLORIDE 10 MEQ/50ML IV SOLN
10.0000 meq | INTRAVENOUS | Status: AC
  Administered 2021-12-10 (×4): 10 meq via INTRAVENOUS
  Filled 2021-12-10 (×2): qty 50

## 2021-12-10 MED ORDER — GLYCOPYRROLATE 0.2 MG/ML IJ SOLN
0.2000 mg | INTRAMUSCULAR | Status: DC | PRN
  Administered 2021-12-11: 0.2 mg via INTRAVENOUS
  Filled 2021-12-10 (×2): qty 1

## 2021-12-10 MED ORDER — ACETAMINOPHEN 650 MG RE SUPP: 650.0000 mg | Freq: Four times a day (QID) | RECTAL | Status: DC | PRN

## 2021-12-10 MED ORDER — POLYVINYL ALCOHOL 1.4 % OP SOLN: 1.0000 [drp] | Freq: Four times a day (QID) | OPHTHALMIC | Status: DC | PRN

## 2021-12-10 MED ORDER — PROCHLORPERAZINE EDISYLATE 10 MG/2ML IJ SOLN: 10.0000 mg | Freq: Four times a day (QID) | INTRAMUSCULAR | Status: DC | PRN

## 2021-12-10 MED ORDER — MORPHINE BOLUS VIA INFUSION
5.0000 mg | INTRAVENOUS | Status: DC | PRN
  Administered 2021-12-10 (×3): 5 mg via INTRAVENOUS

## 2021-12-10 MED ORDER — MIDAZOLAM HCL 2 MG/2ML IJ SOLN: 2.0000 mg | INTRAMUSCULAR | Status: DC | PRN

## 2021-12-10 MED ORDER — ZIPRASIDONE MESYLATE 20 MG IM SOLR: 10.0000 mg | Freq: Two times a day (BID) | INTRAMUSCULAR | Status: DC | PRN

## 2021-12-10 MED ORDER — DIPHENHYDRAMINE HCL 50 MG/ML IJ SOLN: 25.0000 mg | INTRAMUSCULAR | Status: DC | PRN

## 2021-12-10 MED ORDER — ACETAMINOPHEN 325 MG PO TABS: 650.0000 mg | ORAL_TABLET | Freq: Four times a day (QID) | ORAL | Status: DC | PRN

## 2021-12-10 MED ORDER — FUROSEMIDE 10 MG/ML IJ SOLN
80.0000 mg | Freq: Three times a day (TID) | INTRAMUSCULAR | Status: DC
  Administered 2021-12-10: 80 mg via INTRAVENOUS
  Filled 2021-12-10: qty 8

## 2021-12-10 MED ORDER — PROCHLORPERAZINE 25 MG RE SUPP: 25.0000 mg | Freq: Two times a day (BID) | RECTAL | Status: DC | PRN

## 2021-12-10 NOTE — Progress Notes (Signed)
Chaplain responded to request to provide spiritual care to pt w SOB, provided prayer and compassionate presence.  Brother Elijah Birk is [employer] in [place], [place], doing a Systems developer for church member today; will come tomorrow afternoon; sister Ander Slade is in [place].  Only two remaining family members.   Pt is a member of Retail [employer]. [employer], but inactive for years due to shut-in status. Church is aware.   Chaplain spoke to [place] and established texting connection.  Chaplain appreciates RN Jenifer's care and bedside manner with pt and family  Will follow pt.  Please text or page if needed earlier.   Belia Heman, [place] Pager:  [phone] Text:  [phone]   12/10/21 1000  Clinical Encounter Type  Visited With Patient;Family  Visit Type Initial;Spiritual support;Critical Care  Referral From Nurse  Consult/Referral To Chaplain  Spiritual Encounters  Spiritual Needs Prayer  Stress Factors  Patient Stress Factors Loss of control  Family Stress Factors Family relationships

## 2021-12-10 NOTE — IPAL (Signed)
  Interdisciplinary Goals of Care Family Meeting   Date carried out: 12/10/2021  Location of the meeting: Phone conference  Member's involved: Physician and Family Member or next of kin- brother & POA Tom  Durable Power of Attorney or Environmental health practitioner: brother, Elijah Birk  Discussion: We discussed goals of care for [employer] .  Her brother Elijah Birk is concerned that she is dying. He reports she had a weak baseline and has only deteriorated in the hospital. He does not want her to suffer and wants to focus on her being comfortable. He will come visit tomorrow but wants to start comfort care today. He is interested in having the chaplain see her and has been in touch with her church in [place] - [address] [employer].  RN [place] updated on discussion.  Code status: Full DNR  Disposition: In-patient comfort care  Time spent for the meeting: 10 min.    Steffanie Dunn, DO  12/10/2021, 8:41 AM

## 2021-12-10 NOTE — Progress Notes (Signed)
NAME:  Kristi Erickson, MRN:  [id number], DOB:  Dec 04, 1940, LOS: 12 ADMISSION DATE:  11/27/2021, CONSULTATION DATE:  11/28 REFERRING MD:  Dr. Derrell Lolling, CHIEF COMPLAINT:  Perf sigmoid colon; shock   History of Present Illness:  Patient is 81 year old female with pertinent PMH of Turner syndrome, HTN, HLD, hypothyroidism, chronic stomach problems presents to [facility] ED on 11/20 with nausea and abdominal pain.  Patient admitted to [facility] ED on 11/20 with abdominal pain.  CT abdomen/pelvis showing small pneumoperitoneum with concern for bowel perforation.  WBC 37.  Surgery consulted.  Patient thought to have bowel obstruction.  Made n.p.o. given IV fluids.  NG tube on low intermittent suction.  Culture sent and started on antibiotics.  On 11/28 patient with abdominal distention.  Abdominal x-ray showing dilated small bowel but minimal.  Patient taken to the OR for surgery for ex lap.  Found to have perforated sigmoid colon.  Bowel resection performed and placing ostomy.  Patient now on multiple pressors: Vaso and levo.  Patient going to ICU post OR.  PCCM consulted.  Pertinent  Medical History   Past Medical History:  Diagnosis Date   Acute blood loss anemia    Anxiety    Ascending aortic aneurysm (HCC)    Depression    Diverticulitis    GERD (gastroesophageal reflux disease)    Glaucoma    Heart murmur    History of hiatal hernia    Hyperlipidemia    Hypertension    Hypokalemia    Hypothyroidism    IBS (irritable bowel syndrome)    Insomnia    Leukocytosis    Osteoporosis    Physical deconditioning    Stomach problems    Turner's syndrome    Vision disorder    Vitamin D deficiency      Significant Hospital Events: Including procedures, antibiotic start and stop dates in addition to other pertinent events   11/20: admitted to [facility] abdominal pain; possible bowel obstruction 11/28: increased bowel distention; went to OR; perf sigmoid colon; received bowel resection and ostomy  creation. Probably aspirated during case.  Interim History / Subjective:  Agitated overnight.  Objective   Blood pressure (!) 145/86, pulse (!) 107, temperature 98.7 F (37.1 C), resp. rate (!) 28, height 4\' 11"  (1.499 m), weight 48.8 kg, SpO2 94 %.    FiO2 (%):  [60 %-100 %] 70 %   Intake/Output Summary (Last 24 hours) at 12/10/2021 0911 Last data filed at 12/10/2021 0600 Gross per 24 hour  Intake 2190.72 ml  Output 3900 ml  Net -1709.28 ml    Filed Weights   12/08/21 0500 12/09/21 0618 12/09/21 1518  Weight: 45.7 kg 45.5 kg 48.8 kg   NG output 255 cc   Examination: General: frail elderly woman lying in bed, agitated HEENT: [place]/AT, eyes anicteric Resp : less rhonchi, on NRB, mild tachypnea, no accessory muscle use Neuro : awake, alert, moving her legs around all the time, moving arms but less, GI: soft, minimally TTP. Bilious output from NGT Extremities: still has edema, LUE ebtter Skin: pallor, warm & dry  Na+ 133 K+ 3.0 Bicarb 27 BUN 60 Cr 1.63 C16.9 H/H 9.3/26.2 Platelets 113  Resp culture> moderate WBC> no growth to date Blood culture no growth to date  Resolved Hospital Problem list   Hypocalcemia Lactic acidosis, metabolic acidosis Hypophosphatemia Hypokalemia Severe metabolic acidosis   Assessment & Plan:  Septic shock due to peritonitis Perforated sigmoid colon s/p bowel resection and ostomy placement on 11/28 Small volume  pneumoperitoneum Likely left lower lobe aspiration pneumonia versus pneumonitis Acute respiratory failure with hypoxia L pleural effusion vs aspiration pneumonia Hyponatremia, hypervolemic-improving Hypokalemia AKI Acute post-op anemia Acute urinary retention HTN, HLD Hypothyroidism Hyperglycemia, controlled Depression LUE edema, diffuse anasarca Agitated deliriu, acute metabolic encephalopathy frailty  -comfort care after discussion with brother   Best Practice (right click and "Reselect all SmartList Selections"  daily)   Diet/type: NPO and TPN DVT prophylaxis: prophylactic heparin   GI prophylaxis: PPI Lines: Central line Foley:  Yes, and it is still needed Code Status:  DNR Last date of multidisciplinary goals of care discussion [11/28 updated brother Elijah Birk over phone. Said he won't be able to come in person until Thursday.]  Labs   CBC: Recent Labs  Lab 07-Dec-2021 0257 Dec 07, 2021 1843 12/07/2021 2128 12/07/21 0848 12/08/21 0833 12/09/21 0726  WBC 21.0* 4.0  --  24.4* 21.0* 16.9*  NEUTROABS 18.8* 3.3  --   --   --   --   HGB 13.2 10.8* 11.9* 13.4 10.3* 9.3*  HCT 39.7 33.8* 35.0* 41.8 30.0* 26.2*  MCV 93.0 95.2  --  97.2 90.1 88.2  PLT 173 147*  --  138* 95* 113*     Basic Metabolic Panel: Recent Labs  Lab 12-07-2021 0257 12/07/2021 2039 12/07/21 0623 12/07/21 1420 12/07/21 2002 12/08/21 0640 12/09/21 0726 12/10/21 0449  NA 137   < > 127* 129* 133* 130* 133* 138  K 3.4*   < > 5.8* 4.4 4.8 4.2 3.0* 3.3*  CL 102   < > 105 100 93* 88* 95* 99  CO2 25   < > 14* 20* 23 31 27 25   GLUCOSE 149*   < > 192* 192* 159* 116* 121* 156*  BUN 30*   < > 38* 48* 53* 50* 60* 65*  CREATININE 0.73   < > 0.86 1.28* 1.41* 1.35* 1.63* 1.55*  CALCIUM 8.1*   < > 5.6* >15.0* 8.3* 7.2* 7.8* 7.9*  MG 2.2  --  1.4*  --   --  2.6* 2.3 2.1  PHOS 2.7  --  3.3 3.7  --  4.3  --  3.8   < > = values in this interval not displayed.    GFR: Estimated Creatinine Clearance: 19.7 mL/min (A) (by C-G formula based on SCr of 1.55 mg/dL (H)). Recent Labs  Lab 07-Dec-2021 1843 12/07/21 0102 12/07/21 0848 12/07/21 1420 12/08/21 0833 12/09/21 0726  WBC 4.0  --  24.4*  --  21.0* 16.9*  LATICACIDVEN 2.5* 1.1 4.2* 3.1*  --   --        14/01/23, DO 12/10/21 9:25 AM [facility]  For contact information, see Amion. If no response to pager, please call PCCM consult pager. After hours, 7PM- 7AM, please call Elink.

## 2021-12-10 NOTE — Progress Notes (Signed)
eLink Physician-Brief Progress Note Patient Name: Kristi Erickson DOB: 08-06-1940 MRN: [id number]   Date of Service  12/10/2021  HPI/Events of Note  Notified of potassium 3.3, creatinine 1.55, GFR 34, stable Received furosemide  eICU Interventions  Ordered potassium total of 40 meqs to run via PICC line as patient is NPO     Intervention Category Intermediate Interventions: Electrolyte abnormality - evaluation and management  [name] 12/10/2021, 6:04 AM

## 2021-12-10 NOTE — Progress Notes (Signed)
Assessment & Plan: POD#4 - s/p ex lap with Hartmann's for perforated sigmoid colon - Dr. 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  CCM managing and having discussion with family regarding goals of care and possible consultation with palliative care.   FEN: NPO/NGT/TNA ID: zosyn, Dc 11/28, Cefepime, Flagyl/vanc 11/28 --> VTE: SCDs, Heparin subq        Kristi Level, MD [facility] A [facility] practice Office: [phone]        Chief Complaint: Perforated diverticular disease  Subjective: Patient in ICU, on non-rebreather mask; nursing at bedside; responsive  Objective: Vital signs in last 24 hours: Temp:  [97.7 F (36.5 C)-98 F (36.7 C)] 98 F (36.7 C) (12/02 0400) Pulse Rate:  [90-125] 103 (12/02 0700) Resp:  [15-38] 29 (12/02 0700) BP: (89-146)/(48-108) 141/82 (12/02 0700) SpO2:  [83 %-97 %] 92 % (12/02 0700) FiO2 (%):  [60 %-100 %] 60 % (12/02 0400) Weight:  [48.8 kg] 48.8 kg (12/01 1518) Last BM Date : 12/09/21 (Via ostomy)  Intake/Output from previous day: 12/01 0701 - 12/02 0700 In: 2343.3 [I.V.:1629.8; IV Piggyback:713.5] Out: 3900 [Urine:3740; Emesis/NG output:90; Stool:70] Intake/Output this shift: No intake/output data recorded.  Physical Exam: HEENT - sclerae clear, mucous membranes moist Neck - soft Chest - coarse bilaterally Abdomen - soft; minimal distension; ostomy viable with small liquid stool in bag; midline dressing dry and intact (changed at 6AM)   Lab Results:  Recent Labs    12/08/21 0833 12/09/21 0726  WBC 21.0* 16.9*  HGB 10.3* 9.3*  HCT 30.0* 26.2*  PLT 95* 113*   BMET Recent Labs    12/09/21 0726 12/10/21 0449  NA 133* 138  K 3.0* 3.3*  CL  95* 99  CO2 27 25  GLUCOSE 121* 156*  BUN 60* 65*  CREATININE 1.63* 1.55*  CALCIUM 7.8* 7.9*   PT/INR No results for input(s): "LABPROT", "INR" in the last 72 hours. Comprehensive Metabolic Panel:    Component Value Date/Time   NA 138 12/10/2021 0449   NA 133 (L) 12/09/2021 0726   NA 137 07/07/2015 0000   K 3.3 (L) 12/10/2021 0449   K 3.0 (L) 12/09/2021 0726   CL 99 12/10/2021 0449   CL 95 (L) 12/09/2021 0726   CO2 25 12/10/2021 0449   CO2 27 12/09/2021 0726   BUN 65 (H) 12/10/2021 0449   BUN 60 (H) 12/09/2021 0726   BUN 12 07/07/2015 0000   CREATININE 1.55 (H) 12/10/2021 0449   CREATININE 1.63 (H) 12/09/2021 0726   CREATININE 0.69 12/25/2016 1514   CREATININE 0.60 06/01/2015 1005   GLUCOSE 156 (H) 12/10/2021 0449   GLUCOSE 121 (H) 12/09/2021 0726   CALCIUM 7.9 (L) 12/10/2021 0449   CALCIUM 7.8 (L) 12/09/2021 0726   AST 21 12/09/2021 0726   AST 36 12/08/2021 0640   ALT 22 12/09/2021 0726   ALT 22 12/08/2021 0640   ALKPHOS 65 12/09/2021 0726   ALKPHOS 48 12/08/2021 0640   BILITOT 0.5 12/09/2021 0726   BILITOT 0.4 12/08/2021 0640   PROT 3.8 (L) 12/09/2021 0726   PROT 3.0 (L) 12/08/2021 0640   ALBUMIN <1.5 (L) 12/09/2021 0726   ALBUMIN <1.5 (L) 12/08/2021 0640    Studies/Results: DG Chest Port 1 [address]  Result Date: 12/09/2021 CLINICAL DATA:  Acute respiratory failure. EXAM: PORTABLE CHEST 1 VIEW COMPARISON:  Chest radiograph dated 12/07/2021. FINDINGS: Enteric tube with tip in the region of the gastric fundus. Right-sided PICC in similar position. Interval removal of the endotracheal tube. Shallow inspiration. Left pleural effusion and left lung base atelectasis or infiltrate, similar to the prior radiograph. An area of faint airspace opacity in the right lower lung field Erickson represent atelectasis or developing infiltrate. No pneumothorax. Stable cardiac silhouette. Atherosclerotic calcification of the aorta. Median sternotomy wires. No acute osseous pathology.  IMPRESSION: 1. Left pleural effusion and left lung base atelectasis or infiltrate, similar to the prior radiograph. 2. Right lower lung field atelectasis or developing infiltrate. Electronically Signed   By: Elgie Collard M.D.   On: 12/09/2021 23:36   VAS [place] UPPER EXTREMITY VENOUS DUPLEX  Result Date: 12/09/2021 UPPER VENOUS STUDY  Patient Name:  Kristi Erickson  Date of Exam:   12/09/2021 Medical Rec #: [id number]          Accession #:    [id number] Date of Birth: [birth year]          Patient Gender: F Patient Age:   81 years Exam Location:  [facility] Procedure:      VAS [place] UPPER EXTREMITY VENOUS DUPLEX Referring Phys: Kristi Erickson --------------------------------------------------------------------------------  Indications: Edema Risk Factors: None identified. Limitations: Poor ultrasound/tissue interface. Comparison Study: No prior studies. Performing Technologist: Chanda Busing RVT  Examination Guidelines: A complete evaluation includes B-mode imaging, spectral Doppler, color Doppler, and power Doppler as needed of all accessible portions of each vessel. Bilateral testing is considered an integral part of a complete examination. Limited examinations for reoccurring indications Erickson be performed as noted.  Right Findings: +----------+------------+---------+-----------+----------+-------+ RIGHT     CompressiblePhasicitySpontaneousPropertiesSummary +----------+------------+---------+-----------+----------+-------+ Subclavian    Full       Yes       Yes                      +----------+------------+---------+-----------+----------+-------+  Left Findings: +----------+------------+---------+-----------+----------+-------+ LEFT      CompressiblePhasicitySpontaneousPropertiesSummary +----------+------------+---------+-----------+----------+-------+ IJV           Full       Yes       Yes                      +----------+------------+---------+-----------+----------+-------+  Subclavian    Full       Yes       Yes                      +----------+------------+---------+-----------+----------+-------+ Axillary      Full       Yes       Yes                      +----------+------------+---------+-----------+----------+-------+ Brachial      Full       Yes       Yes                      +----------+------------+---------+-----------+----------+-------+ Radial        Full                                          +----------+------------+---------+-----------+----------+-------+ Ulnar         Full                                          +----------+------------+---------+-----------+----------+-------+  Cephalic      None                                   Acute  +----------+------------+---------+-----------+----------+-------+ Basilic       None                                   Acute  +----------+------------+---------+-----------+----------+-------+  Summary:  Right: No evidence of thrombosis in the subclavian.  Left: No evidence of deep vein thrombosis in the upper extremity. Findings consistent with acute superficial vein thrombosis involving the left basilic vein and left cephalic vein.  *See table(s) above for measurements and observations.     Preliminary       Kristi Erickson 12/10/2021  Patient ID: Kristi Erickson, female   DOB: 1940-05-17, 81 y.o.   MRN: [id number]

## 2021-12-10 NOTE — Progress Notes (Signed)
PHARMACY - TOTAL PARENTERAL NUTRITION CONSULT NOTE   Indication: small volume pneumoperitoneum, SBO, perforated sigmoid colon   Patient Measurements: Height: _0  (149.9 cm) Weight: 48.8 kg (107 lb 9.4 oz) IBW/kg (Calculated) : 43.2 TPN AdjBW (KG): 41.1 Body mass index is 21.73 kg/m. Usual Weight: 48kg, ~ 105/106 lbs per patient   Assessment: Patient with PMH notable for aortic insufficiency, hypertension, HLD, hypothyroidism, Turner syndrome, horseshoe kidney presented with initial concern for a small bowel obstruction. Patient reports no bowel movement and poor oral intake (ate 1-2 crackers per day) for 10 days prior to admission. Prior to this, patient had adequate oral intake stating she ate 3 meals per day. Average meal included protein, carbs, and some fat (for ex: breakfast: 1-2 eggs, sausage, and english muffin). General surgery consulted for pneumoperitoneum seen on CT scan 11/20. On 11/28 patient decompensated, found to have perforated sigmoid colon now s/p ex-lap. Pharmacy consulted for TPN.   Of note, patient has shellfish allergy reported. Patient confirms allergy in the chart with reported reaction of nausea and vomiting. Patient reports that she has tolerated other fish besides oysters and scallops. No anaphylaxis reported in the past.   Glucose / Insulin: BG < 180, no history of diabetes, A1C from several years ago 5.5%, Semglee 5,  used 3 u SSI/24hr  Electrolytes: K 3.3 (received K 80 mEq IV yesterday, another 50 mEq IV ordered), CoCa > 9.9,  others wnl  Renal: Scr: 1.55 (BL  ~0.8), BUN: 65 up, IV furosemide x2  Hepatic: AST/ALT/alk phos/Tbili WNL, TG 67, albumin: <1.5 Intake / Output; MIVF: UOP: 3.2 mL/kg/hr, ostomy- 52m,  NGT 90 ml  GI Imaging: 11/20: CT abd- small pneumoperitoneum, possibility of stercoral colitis is not excluded 11/20 KUB- large stool burden  11/24 KUB- Increased small bowel distension question small bowel obstruction 11/26 KUB - Stable small bowel  dilatation concerning for distal small bowel obstruction or possibly ileus  11/27 KUB -Persistent small bowel dilation concerning for small bowel obstruction 11/28 KUB: persistent gaseous distention  GI Surgeries / Procedures:  11/28: Ex-lap with hartmann, perf sigmoid colon, bowel resection and ostomy placement  Central access: PICC line placed 11/25  TPN start date: 11/25   Nutritional Goals: Goal (unconcentrated) TPN rate is 65 mL/hr (provides 80g of protein and 1500 kcals per day, per RD updated recommendations 11/29)  Estimated Needs Total Energy Estimated Needs: 1350-1575 kcals Total Protein Estimated Needs: 65-80 gm Total Fluid Estimated Needs: >/= 1.3 L  Current Nutrition:  TPN  NPO   Plan:  Patient is now comfort care. Ok to finish current bag per team.  Stop SSI and BG checks Instructions entered to taper to 25 ml/hr for 1 hour at 1700 then stop to prevent hypoglycemia   LBenetta Spar PharmD, BCPS, BCCP Clinical Pharmacist  Please check AMION for all [place] numbers After 10:00 PM, call [phone]

## 2021-12-10 NOTE — Progress Notes (Signed)
Chaplain returned to provide ongoing spiritual care to pt, who is resting comfortably.  Provided prayer, song and reading of Psalms.  Please call as needed for f/u.  Belia Heman, Chaplain Pager:  [phone]

## 2021-12-11 DIAGNOSIS — K668 Other specified disorders of peritoneum: Secondary | ICD-10-CM | POA: Diagnosis not present

## 2021-12-11 DIAGNOSIS — J9601 Acute respiratory failure with hypoxia: Secondary | ICD-10-CM | POA: Diagnosis not present

## 2021-12-11 DIAGNOSIS — Z515 Encounter for palliative care: Secondary | ICD-10-CM | POA: Diagnosis not present

## 2021-12-11 LAB — CULTURE, BLOOD (ROUTINE X 2)
Culture: NO GROWTH
Culture: NO GROWTH

## 2022-01-09 NOTE — Progress Notes (Signed)
[name] noted on bedside EKG at 1446.  Pt confirmed to have no auscultated heart sounds x1 min at 1452 by this RN and Jaci Standard.  Pt's brother Kristi Erickson made aware of pt's passing.    1506:  Pt's brother Kristi Erickson at bedside.

## 2022-01-09 NOTE — Death Summary Note (Signed)
DEATH SUMMARY   Patient Details  Name: Kristi Erickson MRN: [id number] DOB: 13-Dec-1940  Admission/Discharge Information   Admit Date:  12/26/2021  Date of Death: Date of Death: 01-08-22 (Simultaneous filing. User may not have seen previous data.)  Time of Death: Time of Death: 1452 (Simultaneous filing. User may not have seen previous data.)  Length of Stay: 13  Referring Physician: Berdine Dance, PA-C   Reason(s) for Hospitalization  Sepsis due to peritonitis  Diagnoses  Preliminary cause of death:  Secondary Diagnoses (including complications and co-morbidities):  Principal Problem:   Pneumoperitoneum Active Problems:   Essential hypertension   Dyslipidemia   Turner's syndrome   Thyroid nodule   Hypokalemia   Bowel perforation (HCC)   Malnutrition of moderate degree   Shock (HCC)   Acute respiratory failure (HCC)   Perforated sigmoid colon (HCC) Septic shock due to peritonitis Perforated sigmoid colon s/p bowel resection and ostomy placement  Small volume pneumoperitoneum Likely left lower lobe aspiration pneumonia  Acute respiratory failure with hypoxia Hyponatremia, hypervolemic Hypokalemia AKI Acute post-op anemia Acute urinary retention HTN, HLD Hypothyroidism Hyperglycemia, controlled Depression LUE edema, diffuse anasarca Agitated delirium, acute metabolic encephalopathy frailty  Brief Hospital Course (including significant findings, care, treatment, and services provided and events leading to death)  Kristi Erickson is a 82 y.o. year old female PMH of Turner syndrome, HTN, HLD, hypothyroidism, chronic stomach problems presents to [facility] ED on 2022/12/27 with nausea and abdominal pain.   Patient admitted to [facility] ED on 12-27-22 with abdominal pain.  CT abdomen/pelvis showing small pneumoperitoneum with concern for bowel perforation.  WBC 37.  Surgery consulted.  Patient thought to have bowel obstruction.  Made n.p.o. given IV fluids.  NG tube on low intermittent  suction.  Culture sent and started on antibiotics.   On 11/28 patient with abdominal distention.  Abdominal x-ray showing dilated small bowel but minimal.  Patient taken to the OR for surgery for ex lap.  Found to have perforated sigmoid colon.  Bowel resection performed and placing ostomy.  Patient now on multiple pressors: Vaso and levo.  Patient going to ICU post OR.  PCCM consulted.  While in the ICU she remained tenuous from a hemodynamic response and respiratory standpoint. She was able to be extubated after 2 days but required frequent suctioning due NM weakness causing difficulty clearing secretions. Eventually her family elected to transition to comfort-focused care. She expired on 09-Jan-2023.   Pertinent Labs and Studies  Significant Diagnostic Studies VAS [place] UPPER EXTREMITY VENOUS DUPLEX  Result Date: 12/10/2021 UPPER VENOUS STUDY  Patient Name:  RENESHA LIZAMA  Date of Exam:   12/09/2021 Medical Rec #: [id number]          Accession #:    [id number] Date of Birth: 04-08-1940          Patient Gender: F Patient Age:   55 years Exam Location:  [facility] Procedure:      VAS [place] UPPER EXTREMITY VENOUS DUPLEX Referring Phys: Karie Fetch --------------------------------------------------------------------------------  Indications: Edema Risk Factors: None identified. Limitations: Poor ultrasound/tissue interface. Comparison Study: No prior studies. Performing Technologist: Chanda Busing RVT  Examination Guidelines: A complete evaluation includes B-mode imaging, spectral Doppler, color Doppler, and power Doppler as needed of all accessible portions of each vessel. Bilateral testing is considered an integral part of a complete examination. Limited examinations for reoccurring indications may be performed as noted.  Right Findings: +----------+------------+---------+-----------+----------+-------+ RIGHT     CompressiblePhasicitySpontaneousPropertiesSummary  +----------+------------+---------+-----------+----------+-------+ Subclavian  Full       Yes       Yes                      +----------+------------+---------+-----------+----------+-------+  Left Findings: +----------+------------+---------+-----------+----------+-------+ LEFT      CompressiblePhasicitySpontaneousPropertiesSummary +----------+------------+---------+-----------+----------+-------+ IJV           Full       Yes       Yes                      +----------+------------+---------+-----------+----------+-------+ Subclavian    Full       Yes       Yes                      +----------+------------+---------+-----------+----------+-------+ Axillary      Full       Yes       Yes                      +----------+------------+---------+-----------+----------+-------+ Brachial      Full       Yes       Yes                      +----------+------------+---------+-----------+----------+-------+ Radial        Full                                          +----------+------------+---------+-----------+----------+-------+ Ulnar         Full                                          +----------+------------+---------+-----------+----------+-------+ Cephalic      None                                   Acute  +----------+------------+---------+-----------+----------+-------+ Basilic       None                                   Acute  +----------+------------+---------+-----------+----------+-------+  Summary:  Right: No evidence of thrombosis in the subclavian.  Left: No evidence of deep vein thrombosis in the upper extremity. Findings consistent with acute superficial vein thrombosis involving the left basilic vein and left cephalic vein.  *See table(s) above for measurements and observations.  Diagnosing physician: Gerarda Fraction Electronically signed by Gerarda Fraction on 12/10/2021 at 1:25:40 PM.    Final    DG Chest Port 1 View  Result Date:  12/09/2021 CLINICAL DATA:  Acute respiratory failure. EXAM: PORTABLE CHEST 1 VIEW COMPARISON:  Chest radiograph dated 12/07/2021. FINDINGS: Enteric tube with tip in the region of the gastric fundus. Right-sided PICC in similar position. Interval removal of the endotracheal tube. Shallow inspiration. Left pleural effusion and left lung base atelectasis or infiltrate, similar to the prior radiograph. An area of faint airspace opacity in the right lower lung field may represent atelectasis or developing infiltrate. No pneumothorax. Stable cardiac silhouette. Atherosclerotic calcification of the aorta. Median sternotomy wires. No acute osseous pathology. IMPRESSION: 1. Left pleural effusion and left lung base atelectasis or infiltrate, similar  to the prior radiograph. 2. Right lower lung field atelectasis or developing infiltrate. Electronically Signed   By: Elgie Collard M.D.   On: 12/09/2021 23:36   DG CHEST PORT 1 VIEW  Result Date: 12/07/2021 CLINICAL DATA:  History of pneumoperitoneum secondary to sigmoid colon perforation. Status post intubation. EXAM: PORTABLE CHEST 1 VIEW COMPARISON:  Chest radiograph dated 2021/12/17 FINDINGS: Lines/tubes: Endotracheal tube tip projects 1.3 cm above the carina. Enteric tube tip is directed superiorly and projects over the left upper quadrant. Right upper extremity PICC tip projects over the superior cavoatrial junction. Surgical clips project over the epigastrium. Chest: Low lung volumes with bibasilar patchy opacities, likely atelectasis. Increased dense left retrocardiac opacities. Pleura: Increased small left pleural effusion. Trace right pleural effusion. No pneumothorax. Heart/mediastinum: Similar  cardiomediastinal silhouette. Bones: Median sternotomy wires are nondisplaced. Similar coarse splenic calcifications over the left upper quadrant. IMPRESSION: 1. Endotracheal tube tip projects 1.3 cm above the carina. 2. Increased small left pleural effusion. Trace right  pleural effusion. 3. Low lung volumes with bibasilar atelectasis. Increased dense left retrocardiac opacities, likely atelectasis. Electronically Signed   By: Agustin Cree M.D.   On: 12/07/2021 08:20   Portable Chest x-ray  Result Date: 11/26/2021 CLINICAL DATA:  Endotracheal tube EXAM: PORTABLE CHEST 1 VIEW COMPARISON:  08/03/2015 FINDINGS: Endotracheal tube tip just above the carina. Esophageal tube tip in the left upper quadrant. Right upper extremity central venous catheter tip over the SVC. Post sternotomy changes. Low lung volumes. Consolidation at left base with probable small pleural effusions. Normal cardiac size. Aortic atherosclerosis. IMPRESSION: 1. Support lines and tubes as above. Endotracheal tube tip just above the carina. 2. Low lung volumes with consolidation at left base and probable small pleural effusions. Electronically Signed   By: Jasmine Pang M.D.   On: 11/30/2021 17:49   [place] EKG SITE RITE  Result Date: 11/15/2021 If Site Rite image not attached, placement could not be confirmed due to current cardiac rhythm.  DG Abd Portable 1V  Result Date: 11/24/2021 CLINICAL DATA:  Small-bowel obstruction. EXAM: PORTABLE ABDOMEN - 1 VIEW COMPARISON:  KUB 1 day prior. FINDINGS: Gaseous distention of the bowel is unchanged. There is no definite free intraperitoneal air, within the confines of supine technique. Abdominal surgical clips are unchanged. There is no acute osseous abnormality. IMPRESSION: Gaseous distention of the bowel is unchanged. Electronically Signed   By: Lesia Hausen M.D.   On: 11/10/2021 08:03   DG Abd Portable 1V  Result Date: 12/05/2021 CLINICAL DATA:  Small-bowel obstruction EXAM: PORTABLE ABDOMEN - 1 VIEW COMPARISON:  Radiograph 12/04/2021 FINDINGS: Persistent small bowel dilation, measuring up to 5.4 cm in, similar prior. Surgical clips overlie the mid abdomen. Multilevel degenerative changes of spine. IMPRESSION: Persistent small bowel dilation concerning for small  bowel obstruction. Electronically Signed   By: Caprice Renshaw M.D.   On: 12/05/2021 10:43   DG Abd Portable 1V  Result Date: 12/04/2021 CLINICAL DATA:  Ileus. EXAM: PORTABLE ABDOMEN - 1 VIEW COMPARISON:  December 02, 2021. FINDINGS: Stable small bowel dilatation is noted concerning for distal small obstruction or possibly ileus. Stool is noted in the nondilated colon. Surgical clips are seen in epigastric region. Nasogastric tube has been removed. IMPRESSION: Stable small bowel dilatation is noted concerning for distal small bowel obstruction or possibly ileus. Electronically Signed   By: Lupita Raider M.D.   On: 12/04/2021 10:35   [place] EKG SITE RITE  Result Date: 12/03/2021 If Site Rite image not attached, placement could  not be confirmed due to current cardiac rhythm.  DG Abd 1 View  Result Date: 12/02/2021 CLINICAL DATA:  Nasogastric tube placement. EXAM: ABDOMEN - 1 VIEW COMPARISON:  Same day. FINDINGS: Nasogastric tube tip is seen in expected position of proximal stomach. Stable small bowel dilatation is noted concerning for ileus or partial small bowel obstruction. Stool is noted in nondilated colon. IMPRESSION: Nasogastric tube tip is seen in expected position of proximal stomach. Stable small bowel dilatation as described above. Electronically Signed   By: Lupita Raider M.D.   On: 12/02/2021 10:06   DG Abd 1 View  Result Date: 12/02/2021 CLINICAL DATA:  Abdominal distension EXAM: ABDOMEN - 1 VIEW COMPARISON:  12/07/2021 FINDINGS: Small amount retained contrast in RIGHT colon. Dilated small bowel loops increased since previous exam question small bowel obstruction. No bowel wall thickening. Degenerative changes lumbar spine. Nasogastric tube coiled in distal esophagus. Surgical clips mid abdomen. IMPRESSION: Increased small bowel distension question small bowel obstruction. Nasogastric tube coiled in distal esophagus, recommend removal and replacement. Findings called to [facility] on 2W on  12/02/2021 at 0853 hours. Electronically Signed   By: Ulyses Southward M.D.   On: 12/02/2021 08:54   [place] EKG SITE RITE  Result Date: 12/02/2021 If Site Rite image not attached, placement could not be confirmed due to current cardiac rhythm.  DG Abd 1 View  Result Date: 11/29/2021 CLINICAL DATA:  Abdominal discomfort EXAM: ABDOMEN - 1 VIEW COMPARISON:  CT abdomen and pelvis 11/04/2015 FINDINGS: The bowel gas pattern is normal. There is a large amount of stool throughout the entire colon. There surgical clips in the mid upper abdomen, unchanged. No radio-opaque calculi. Degenerative changes affect the spine and pubic symphysis. IMPRESSION: Large amount of stool throughout the entire colon. No acute findings. Electronically Signed   By: Darliss Cheney M.D.   On: 11/29/2021 21:50   CT ABDOMEN PELVIS WO CONTRAST  Result Date: 11/20/2021 CLINICAL DATA:  Concern for bowel obstruction. EXAM: CT ABDOMEN AND PELVIS WITHOUT CONTRAST TECHNIQUE: Multidetector CT imaging of the abdomen and pelvis was performed following the standard protocol without IV contrast. RADIATION DOSE REDUCTION: This exam was performed according to the departmental dose-optimization program which includes automated exposure control, adjustment of the mA and/or kV according to patient size and/or use of iterative reconstruction technique. COMPARISON:  CT abdomen pelvis dated 08/10/2020. FINDINGS: Evaluation of this exam is limited in the absence of intravenous contrast. Evaluation is also limited due to streak artifact caused by surgical clips and overlying support wires. Lower chest: The visualized lung bases are clear. There is coronary vascular calcification. There is a small pneumoperitoneum.  No free fluid. Hepatobiliary: The liver is grossly unremarkable. Small layering gallstones. No pericholecystic fluid or evidence of acute cholecystitis. Pancreas: Pancreas appears atrophic. No active inflammatory changes. Spleen: Irregular splenic  contour with areas of capsular calcification, likely sequela of prior insult. 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 There is no hydronephrosis or nephrolithiasis on either side. The urinary bladder is unremarkable. Stomach/Bowel: Large amount of dense stool noted throughout the colon and within the sigmoid. There is sigmoid diverticulosis. There is diffuse thickening of the wall of the sigmoid colon with diffuse perisigmoid stranding which may be chronic. The possibility of stercoral colitis is not excluded. Clinical correlation is recommended. No evidence of bowel obstruction. The appendix is not visualized with certainty. No inflammatory changes identified in the right lower quadrant. Vascular/Lymphatic: Moderate aortoiliac atherosclerotic  disease. The IVC is grossly unremarkable. No obvious portal venous gas. No adenopathy. Reproductive: The uterus is anteverted. Small foci of calcification within the uterus. No adnexal masses. Other: Midline vertical anterior pelvic wall incisional scar. Musculoskeletal: Osteopenia with scoliosis and degenerative changes. Partially visualized median sternotomy. Right S1 Tarlov cyst. No acute osseous pathology. IMPRESSION: 1. Small pneumoperitoneum. In the absence of history of recent surgery, findings concerning for bowel perforation. Surgical consult is advised. 2. Sigmoid diverticulosis. Diffuse thickening of the wall of the sigmoid colon with diffuse perisigmoid stranding. Findings may be chronic, however; the possibility of stercoral colitis is not excluded. No evidence of bowel obstruction. 3. Cholelithiasis. 4. Horseshoe kidney. 5.  Aortic Atherosclerosis (ICD10-I70.0). These results were called by telephone at the time of interpretation on 11/29/2021 at 8:43 pm to provider COOPER ROBBINS , who verbally acknowledged these results. Electronically Signed   By: Elgie Collard M.D.   On:  11/26/2021 20:44    Microbiology Recent Results (from the past 240 hour(s))  Culture, blood (Routine X 2) w Reflex to ID Panel     Status: None   Collection Time: Dec 26, 2021  6:35 PM   Specimen: BLOOD LEFT ARM  Result Value Ref Range Status   Specimen Description BLOOD LEFT ARM  Final   Special Requests   Final    BOTTLES DRAWN AEROBIC ONLY Blood Culture results may not be optimal due to an inadequate volume of blood received in culture bottles   Culture   Final    NO GROWTH 5 DAYS Performed at [facility], [address] [place], [place] [address]    Report Status 01/06/2022 FINAL  Final  Culture, blood (Routine X 2) w Reflex to ID Panel     Status: None   Collection Time: 2021/12/26  6:43 PM   Specimen: BLOOD LEFT ARM  Result Value Ref Range Status   Specimen Description BLOOD LEFT ARM  Final   Special Requests   Final    BOTTLES DRAWN AEROBIC ONLY Blood Culture results may not be optimal due to an inadequate volume of blood received in culture bottles   Culture   Final    NO GROWTH 5 DAYS Performed at [facility], [address] [place], [place] [address]    Report Status 12/23/2021 FINAL  Final  Culture, Respiratory w Gram Stain     Status: None   Collection Time: 12/07/21  9:18 AM   Specimen: Tracheal Aspirate; Respiratory  Result Value Ref Range Status   Specimen Description TRACHEAL ASPIRATE  Final   Special Requests NONE  Final   Gram Stain   Final    MODERATE WBC PRESENT,BOTH PMN AND MONONUCLEAR NO ORGANISMS SEEN    Culture   Final    NO GROWTH 2 DAYS Performed at [facility], [address], [place], [place] [address]    Report Status 12/09/2021 FINAL  Final  MRSA Next Gen by PCR, Nasal     Status: None   Collection Time: 12/09/21  7:36 PM   Specimen: Nasal Mucosa; Nasal Swab  Result Value Ref Range Status   MRSA by PCR Next Gen NOT DETECTED NOT DETECTED Final    Comment: (NOTE) The GeneXpert MRSA Assay (FDA approved for NASAL specimens  only), is one component of a comprehensive MRSA colonization surveillance program. It is not intended to diagnose MRSA infection nor to guide or monitor treatment for MRSA infections. Test performance is not FDA approved in patients less than 2  years old. Performed at [facility], [address] [place], [place] [address]     Lab Basic Metabolic Panel: Recent Labs  Lab 07/25/21 0257 07/25/21 2039 12/07/21 [id number] 12/07/21 1420 12/07/21 2002 12/08/21 0640 12/09/21 0726 12/10/21 0449  NA 137   < > 127* 129* 133* 130* 133* 138  K 3.4*   < > 5.8* 4.4 4.8 4.2 3.0* 3.3*  CL 102   < > 105 100 93* 88* 95* 99  CO2 25   < > 14* 20* 23 31 27 25   GLUCOSE 149*   < > 192* 192* 159* 116* 121* 156*  BUN 30*   < > 38* 48* 53* 50* 60* 65*  CREATININE 0.73   < > 0.86 1.28* 1.41* 1.35* 1.63* 1.55*  CALCIUM 8.1*   < > 5.6* >15.0* 8.3* 7.2* 7.8* 7.9*  MG 2.2  --  1.4*  --   --  2.6* 2.3 2.1  PHOS 2.7  --  3.3 3.7  --  4.3  --  3.8   < > = values in this interval not displayed.   Liver Function Tests: Recent Labs  Lab 12/05/21 0500 12/08/21 0640 12/09/21 0726  AST 30 36 21  ALT 31 22 22   ALKPHOS 83 48 65  BILITOT 0.4 0.4 0.5  PROT 4.8* 3.0* 3.8*  ALBUMIN 1.9* <1.5* <1.5*   No results for input(s): "LIPASE", "AMYLASE" in the last 168 hours. No results for input(s): "AMMONIA" in the last 168 hours. CBC: Recent Labs  Lab 07/25/21 0257 07/25/21 1843 07/25/21 2128 12/07/21 0848 12/08/21 0833 12/09/21 0726  WBC 21.0* 4.0  --  24.4* 21.0* 16.9*  NEUTROABS 18.8* 3.3  --   --   --   --   HGB 13.2 10.8* 11.9* 13.4 10.3* 9.3*  HCT 39.7 33.8* 35.0* 41.8 30.0* 26.2*  MCV 93.0 95.2  --  97.2 90.1 88.2  PLT 173 147*  --  138* 95* 113*   Cardiac Enzymes: No results for input(s): "CKTOTAL", "CKMB", "CKMBINDEX", "TROPONINI" in the last 168 hours. Sepsis Labs: Recent Labs  Lab 07/25/21 1843 12/07/21 0102 12/07/21 0848 12/07/21 1420 12/08/21 0833 12/09/21 0726  WBC 4.0  --   24.4*  --  21.0* 16.9*  LATICACIDVEN 2.5* 1.1 4.2* 3.1*  --   --     Procedures/Operations  Ex-lap with colon resection and diverting ostomy CVC  Steffanie DunnLaura P Azaiah Licciardi 12/20/2021, 3:49 PM

## 2022-01-09 NOTE — Progress Notes (Signed)
NAME:  Kristi Erickson, MRN:  [place]:[id number], DOB:  Aug 15, 1940, LOS: 63 ADMISSION DATE:  11/30/2021, CONSULTATION DATE:  11/28 REFERRING MD:  Dr. Rosendo Gros, CHIEF COMPLAINT:  Perf sigmoid colon; shock   History of Present Illness:  Patient is 82 year old female with pertinent PMH of Turner syndrome, HTN, HLD, hypothyroidism, chronic stomach problems presents to [facility] ED on 11/20 with nausea and abdominal pain.  Patient admitted to [facility] ED on 11/20 with abdominal pain.  CT abdomen/pelvis showing small pneumoperitoneum with concern for bowel perforation.  WBC 37.  Surgery consulted.  Patient thought to have bowel obstruction.  Made n.p.o. given IV fluids.  NG tube on low intermittent suction.  Culture sent and started on antibiotics.  On 11/28 patient with abdominal distention.  Abdominal x-ray showing dilated small bowel but minimal.  Patient taken to the OR for surgery for ex lap.  Found to have perforated sigmoid colon.  Bowel resection performed and placing ostomy.  Patient now on multiple pressors: Vaso and levo.  Patient going to ICU post OR.  PCCM consulted.  Pertinent  Medical History   Past Medical History:  Diagnosis Date   Acute blood loss anemia    Anxiety    Ascending aortic aneurysm (HCC)    Depression    Diverticulitis    GERD (gastroesophageal reflux disease)    Glaucoma    Heart murmur    History of hiatal hernia    Hyperlipidemia    Hypertension    Hypokalemia    Hypothyroidism    IBS (irritable bowel syndrome)    Insomnia    Leukocytosis    Osteoporosis    Physical deconditioning    Stomach problems    Turner's syndrome    Vision disorder    Vitamin D deficiency      Significant Hospital Events: Including procedures, antibiotic start and stop dates in addition to other pertinent events   11/20: admitted to [facility] abdominal pain; possible bowel obstruction 11/28: increased bowel distention; went to OR; perf sigmoid colon; received bowel resection and ostomy  creation. Probably aspirated during case.  Interim History / Subjective:  Comfortable    Objective   Blood pressure (!) 65/39, pulse 82, temperature 98.7 F (37.1 C), temperature source Axillary, resp. rate 12, height 4\' 11"  (1.499 m), weight 48.8 kg, SpO2 94 %.    FiO2 (%):  [60 %-100 %] 100 %   Intake/Output Summary (Last 24 hours) at Dec 20, 2021 0716 Last data filed at 20-Dec-2021 0600 Gross per 24 hour  Intake 1013.47 ml  Output 915 ml  Net 98.47 ml    Filed Weights   12/08/21 0500 12/09/21 0618 12/09/21 1518  Weight: 45.7 kg 45.5 kg 48.8 kg    Examination: General: elderly woman sleeping in bed HEENT: [place]/AT, eyes anicteric, NGT in place Resp : breathing comfortably on NRB, no rhonchi today Neuro : sleeping GI: distended, bilious output from NGT   Resolved Hospital Problem list   Hypocalcemia Lactic acidosis, metabolic acidosis Hypophosphatemia Hypokalemia Severe metabolic acidosis   Assessment & Plan:  Septic shock due to peritonitis Perforated sigmoid colon s/p bowel resection and ostomy placement on 11/28 Small volume pneumoperitoneum Likely left lower lobe aspiration pneumonia versus pneumonitis Acute respiratory failure with hypoxia L pleural effusion vs aspiration pneumonia Hyponatremia, hypervolemic-improving Hypokalemia AKI Acute post-op anemia Acute urinary retention HTN, HLD Hypothyroidism Hyperglycemia, controlled Depression LUE edema, diffuse anasarca Agitated deliriu, acute metabolic encephalopathy frailty  -Con't comfort focused care. Does not need a NRB to promote comfort, but  if it is not bothering her it can be left in place.  No family at bedside this morning.      Kristi Dunn, DO 12/31/2021 9:28 AM [facility]  For contact information, see Amion. If no response to pager, please call PCCM consult pager. After hours, 7PM- 7AM, please call Elink.

## 2022-01-09 NOTE — Progress Notes (Signed)
Assessment & Plan: POD#5 - s/p ex lap with Hartmann's for perforated sigmoid colon - Dr. Derrell Lolling 11/28 wound clean, serous drainage - continue dressings as needed Ostomy viable with ostomy bag in place   Patient converted to comfort care by CCM after discussion with patient's brother who is a Education officer, environmental.  Anticipate transfer out of ICU later today.  Surgery will sign off.  Call if we may be of any further assistance.        [name], MD [facility] A [facility] practice Office: [phone]        Chief Complaint: Perforated diverticular disease  Subjective: Patient appears comfortable, sleeping, not arousable at this time.  Nursing at bedside.  Objective: Vital signs in last 24 hours: Temp:  [98.7 F (37.1 C)] 98.7 F (37.1 C) (12/02 1930) Pulse Rate:  [78-104] 78 (12/03 0745) Resp:  [7-19] 15 (12/03 0745) BP: (65-119)/(39-73) 65/39 (12/03 0225) SpO2:  [85 %-97 %] 92 % (12/03 0745) FiO2 (%):  [60 %-100 %] 100 % (12/03 0745) Last BM Date : 12/10/21  Intake/Output from previous day: 12/02 0701 - 12/03 0700 In: 1028.5 [I.V.:856.6; IV Piggyback:171.8] Out: 915 [Urine:915] Intake/Output this shift: No intake/output data recorded.  Physical Exam: Neck - soft Abdomen - soft, mild distension; dressing dry and intact; stoma viable with scant output  Lab Results:  Recent Labs    12/09/21 0726  WBC 16.9*  HGB 9.3*  HCT 26.2*  PLT 113*   BMET Recent Labs    12/09/21 0726 12/10/21 0449  NA 133* 138  K 3.0* 3.3*  CL 95* 99  CO2 27 25  GLUCOSE 121* 156*  BUN 60* 65*  CREATININE 1.63* 1.55*  CALCIUM 7.8* 7.9*   PT/INR No results for input(s): "LABPROT", "INR" in the last 72 hours. Comprehensive Metabolic Panel:    Component Value Date/Time   NA 138 12/10/2021 0449   NA 133 (L) 12/09/2021 0726   NA 137 07/07/2015 0000   K 3.3 (L) 12/10/2021 0449   K 3.0 (L) 12/09/2021 0726   CL 99 12/10/2021 0449   CL 95 (L) 12/09/2021 0726   CO2 25  12/10/2021 0449   CO2 27 12/09/2021 0726   BUN 65 (H) 12/10/2021 0449   BUN 60 (H) 12/09/2021 0726   BUN 12 07/07/2015 0000   CREATININE 1.55 (H) 12/10/2021 0449   CREATININE 1.63 (H) 12/09/2021 0726   CREATININE 0.69 12/25/2016 1514   CREATININE 0.60 06/01/2015 1005   GLUCOSE 156 (H) 12/10/2021 0449   GLUCOSE 121 (H) 12/09/2021 0726   CALCIUM 7.9 (L) 12/10/2021 0449   CALCIUM 7.8 (L) 12/09/2021 0726   AST 21 12/09/2021 0726   AST 36 12/08/2021 0640   ALT 22 12/09/2021 0726   ALT 22 12/08/2021 0640   ALKPHOS 65 12/09/2021 0726   ALKPHOS 48 12/08/2021 0640   BILITOT 0.5 12/09/2021 0726   BILITOT 0.4 12/08/2021 0640   PROT 3.8 (L) 12/09/2021 0726   PROT 3.0 (L) 12/08/2021 0640   ALBUMIN <1.5 (L) 12/09/2021 0726   ALBUMIN <1.5 (L) 12/08/2021 0640    Studies/Results: VAS [place] UPPER EXTREMITY VENOUS DUPLEX  Result Date: 12/10/2021 UPPER VENOUS STUDY  Patient Name:  Kristi Erickson  Date of Exam:   12/09/2021 Medical Rec #: [id number]          Accession #:    [id number] Date of Birth: 1940-02-19          Patient Gender: F Patient Age:   82 years  Exam Location:  [facility] Procedure:      VAS [place] UPPER EXTREMITY VENOUS DUPLEX Referring Phys: Karie Fetch --------------------------------------------------------------------------------  Indications: Edema Risk Factors: None identified. Limitations: Poor ultrasound/tissue interface. Comparison Study: No prior studies. Performing Technologist: Chanda Busing RVT  Examination Guidelines: A complete evaluation includes B-mode imaging, spectral Doppler, color Doppler, and power Doppler as needed of all accessible portions of each vessel. Bilateral testing is considered an integral part of a complete examination. Limited examinations for reoccurring indications may be performed as noted.  Right Findings: +----------+------------+---------+-----------+----------+-------+ RIGHT     CompressiblePhasicitySpontaneousPropertiesSummary  +----------+------------+---------+-----------+----------+-------+ Subclavian    Full       Yes       Yes                      +----------+------------+---------+-----------+----------+-------+  Left Findings: +----------+------------+---------+-----------+----------+-------+ LEFT      CompressiblePhasicitySpontaneousPropertiesSummary +----------+------------+---------+-----------+----------+-------+ IJV           Full       Yes       Yes                      +----------+------------+---------+-----------+----------+-------+ Subclavian    Full       Yes       Yes                      +----------+------------+---------+-----------+----------+-------+ Axillary      Full       Yes       Yes                      +----------+------------+---------+-----------+----------+-------+ Brachial      Full       Yes       Yes                      +----------+------------+---------+-----------+----------+-------+ Radial        Full                                          +----------+------------+---------+-----------+----------+-------+ Ulnar         Full                                          +----------+------------+---------+-----------+----------+-------+ Cephalic      None                                   Acute  +----------+------------+---------+-----------+----------+-------+ Basilic       None                                   Acute  +----------+------------+---------+-----------+----------+-------+  Summary:  Right: No evidence of thrombosis in the subclavian.  Left: No evidence of deep vein thrombosis in the upper extremity. Findings consistent with acute superficial vein thrombosis involving the left basilic vein and left cephalic vein.  *See table(s) above for measurements and observations.  Diagnosing physician: Gerarda Fraction Electronically signed by Gerarda Fraction on 12/10/2021 at 1:25:40 PM.    Final    DG Chest Port 1 View  Result Date:  12/09/2021 CLINICAL DATA:  Acute respiratory failure. EXAM: PORTABLE CHEST 1 VIEW COMPARISON:  Chest radiograph dated 12/07/2021. FINDINGS: Enteric tube with tip in the region of the gastric fundus. Right-sided PICC in similar position. Interval removal of the endotracheal tube. Shallow inspiration. Left pleural effusion and left lung base atelectasis or infiltrate, similar to the prior radiograph. An area of faint airspace opacity in the right lower lung field may represent atelectasis or developing infiltrate. No pneumothorax. Stable cardiac silhouette. Atherosclerotic calcification of the aorta. Median sternotomy wires. No acute osseous pathology. IMPRESSION: 1. Left pleural effusion and left lung base atelectasis or infiltrate, similar to the prior radiograph. 2. Right lower lung field atelectasis or developing infiltrate. Electronically Signed   By: Elgie Collard M.D.   On: 12/09/2021 23:36      [name] 12/16/2021  Patient ID: Beacher May, female   DOB: [birth year], 82 y.o.   MRN: [id number]

## 2022-01-09 DEATH — deceased
